# Patient Record
Sex: Male | Born: 1950
Health system: Southern US, Community
[De-identification: ages and names within clinical notes are randomized; demographics above are authoritative.]

## PROBLEM LIST (undated history)

## (undated) DIAGNOSIS — K5792 Diverticulitis of intestine, part unspecified, without perforation or abscess without bleeding: Secondary | ICD-10-CM

## (undated) DIAGNOSIS — M1990 Unspecified osteoarthritis, unspecified site: Secondary | ICD-10-CM

## (undated) DIAGNOSIS — J439 Emphysema, unspecified: Secondary | ICD-10-CM

## (undated) DIAGNOSIS — K649 Unspecified hemorrhoids: Secondary | ICD-10-CM

## (undated) DIAGNOSIS — K429 Umbilical hernia without obstruction or gangrene: Secondary | ICD-10-CM

## (undated) DIAGNOSIS — N390 Urinary tract infection, site not specified: Secondary | ICD-10-CM

## (undated) DIAGNOSIS — J45909 Unspecified asthma, uncomplicated: Secondary | ICD-10-CM

## (undated) DIAGNOSIS — H269 Unspecified cataract: Secondary | ICD-10-CM

## (undated) DIAGNOSIS — I2510 Atherosclerotic heart disease of native coronary artery without angina pectoris: Secondary | ICD-10-CM

## (undated) DIAGNOSIS — K069 Disorder of gingiva and edentulous alveolar ridge, unspecified: Secondary | ICD-10-CM

## (undated) DIAGNOSIS — T8859XA Other complications of anesthesia, initial encounter: Secondary | ICD-10-CM

## (undated) DIAGNOSIS — K635 Polyp of colon: Secondary | ICD-10-CM

## (undated) DIAGNOSIS — D6800 Von Willebrand disease, unspecified: Secondary | ICD-10-CM

## (undated) DIAGNOSIS — H409 Unspecified glaucoma: Secondary | ICD-10-CM

## (undated) DIAGNOSIS — Z9889 Other specified postprocedural states: Secondary | ICD-10-CM

## (undated) DIAGNOSIS — K4090 Unilateral inguinal hernia, without obstruction or gangrene, not specified as recurrent: Secondary | ICD-10-CM

## (undated) DIAGNOSIS — Z8739 Personal history of other diseases of the musculoskeletal system and connective tissue: Secondary | ICD-10-CM

## (undated) DIAGNOSIS — M4802 Spinal stenosis, cervical region: Secondary | ICD-10-CM

## (undated) DIAGNOSIS — T7840XA Allergy, unspecified, initial encounter: Secondary | ICD-10-CM

## (undated) DIAGNOSIS — H9190 Unspecified hearing loss, unspecified ear: Secondary | ICD-10-CM

## (undated) DIAGNOSIS — K219 Gastro-esophageal reflux disease without esophagitis: Secondary | ICD-10-CM

## (undated) DIAGNOSIS — R0600 Dyspnea, unspecified: Secondary | ICD-10-CM

## (undated) DIAGNOSIS — R112 Nausea with vomiting, unspecified: Secondary | ICD-10-CM

## (undated) DIAGNOSIS — N189 Chronic kidney disease, unspecified: Secondary | ICD-10-CM

## (undated) DIAGNOSIS — R55 Syncope and collapse: Secondary | ICD-10-CM

## (undated) DIAGNOSIS — C439 Malignant melanoma of skin, unspecified: Secondary | ICD-10-CM

## (undated) DIAGNOSIS — N289 Disorder of kidney and ureter, unspecified: Secondary | ICD-10-CM

## (undated) DIAGNOSIS — E785 Hyperlipidemia, unspecified: Secondary | ICD-10-CM

## (undated) DIAGNOSIS — J449 Chronic obstructive pulmonary disease, unspecified: Secondary | ICD-10-CM

## (undated) HISTORY — PX: MELANOMA EXCISION: SHX5266

## (undated) HISTORY — DX: Allergy, unspecified, initial encounter: T78.40XA

## (undated) HISTORY — DX: Polyp of colon: K63.5

## (undated) HISTORY — DX: Chronic kidney disease, unspecified: N18.9

## (undated) HISTORY — DX: Unspecified asthma, uncomplicated: J45.909

## (undated) HISTORY — DX: Syncope and collapse: R55

## (undated) HISTORY — DX: Unspecified cataract: H26.9

## (undated) HISTORY — PX: COLONOSCOPY: SHX174

## (undated) HISTORY — DX: Unspecified hemorrhoids: K64.9

## (undated) HISTORY — DX: Chronic obstructive pulmonary disease, unspecified: J44.9

## (undated) HISTORY — PX: OTHER SURGICAL HISTORY: SHX169

## (undated) HISTORY — DX: Diverticulitis of intestine, part unspecified, without perforation or abscess without bleeding: K57.92

## (undated) HISTORY — DX: Urinary tract infection, site not specified: N39.0

## (undated) HISTORY — DX: Disorder of gingiva and edentulous alveolar ridge, unspecified: K06.9

## (undated) HISTORY — DX: Disorder of kidney and ureter, unspecified: N28.9

## (undated) HISTORY — PX: DENTAL SURGERY: SHX609

## (undated) HISTORY — DX: Unilateral inguinal hernia, without obstruction or gangrene, not specified as recurrent: K40.90

## (undated) HISTORY — DX: Unspecified osteoarthritis, unspecified site: M19.90

## (undated) HISTORY — DX: Emphysema, unspecified: J43.9

## (undated) HISTORY — DX: Unspecified glaucoma: H40.9

## (undated) HISTORY — DX: Malignant melanoma of skin, unspecified: C43.9

## (undated) HISTORY — PX: WISDOM TOOTH EXTRACTION: SHX21

## (undated) HISTORY — DX: Personal history of other diseases of the musculoskeletal system and connective tissue: Z87.39

## (undated) HISTORY — DX: Umbilical hernia without obstruction or gangrene: K42.9

## (undated) HISTORY — DX: Gastro-esophageal reflux disease without esophagitis: K21.9

## (undated) HISTORY — PX: POLYPECTOMY: SHX149

## (undated) HISTORY — PX: HERNIA REPAIR: SHX51

## (undated) HISTORY — DX: Hyperlipidemia, unspecified: E78.5

---

## 1998-12-05 ENCOUNTER — Other Ambulatory Visit: Admission: RE | Admit: 1998-12-05 | Discharge: 1998-12-05 | Payer: Self-pay | Admitting: Urology

## 1998-12-29 ENCOUNTER — Other Ambulatory Visit: Admission: RE | Admit: 1998-12-29 | Discharge: 1998-12-29 | Payer: Self-pay | Admitting: Urology

## 1999-04-06 ENCOUNTER — Encounter (INDEPENDENT_AMBULATORY_CARE_PROVIDER_SITE_OTHER): Payer: Self-pay | Admitting: *Deleted

## 1999-04-06 ENCOUNTER — Ambulatory Visit (HOSPITAL_COMMUNITY): Admission: RE | Admit: 1999-04-06 | Discharge: 1999-04-06 | Payer: Self-pay | Admitting: *Deleted

## 2000-02-04 ENCOUNTER — Encounter: Payer: Self-pay | Admitting: *Deleted

## 2000-02-05 ENCOUNTER — Observation Stay (HOSPITAL_COMMUNITY): Admission: EM | Admit: 2000-02-05 | Discharge: 2000-02-05 | Payer: Self-pay | Admitting: *Deleted

## 2002-08-14 ENCOUNTER — Ambulatory Visit (HOSPITAL_COMMUNITY): Admission: RE | Admit: 2002-08-14 | Discharge: 2002-08-14 | Payer: Self-pay | Admitting: Orthopedic Surgery

## 2002-08-14 ENCOUNTER — Encounter: Payer: Self-pay | Admitting: Orthopedic Surgery

## 2005-03-19 ENCOUNTER — Encounter: Admission: RE | Admit: 2005-03-19 | Discharge: 2005-03-19 | Payer: Self-pay | Admitting: Family Medicine

## 2005-12-04 ENCOUNTER — Ambulatory Visit: Payer: Self-pay | Admitting: Hematology and Oncology

## 2005-12-11 LAB — CBC WITH DIFFERENTIAL/PLATELET
BASO%: 0.3 % (ref 0.0–2.0)
EOS%: 2.8 % (ref 0.0–7.0)
Eosinophils Absolute: 0.2 10*3/uL (ref 0.0–0.5)
MCHC: 35.6 g/dL (ref 32.0–35.9)
MCV: 87.5 fL (ref 81.6–98.0)
MONO%: 6.1 % (ref 0.0–13.0)
RBC: 4.89 10*6/uL (ref 4.20–5.71)
RDW: 13 % (ref 11.2–14.6)
lymph#: 2.5 10*3/uL (ref 0.9–3.3)

## 2005-12-13 LAB — COMPREHENSIVE METABOLIC PANEL
ALT: 26 U/L (ref 0–40)
AST: 16 U/L (ref 0–37)
Albumin: 4.4 g/dL (ref 3.5–5.2)
BUN: 19 mg/dL (ref 6–23)
Calcium: 9.7 mg/dL (ref 8.4–10.5)
Chloride: 107 mEq/L (ref 96–112)
Potassium: 3.8 mEq/L (ref 3.5–5.3)

## 2005-12-13 LAB — APTT: aPTT: 38 seconds — ABNORMAL HIGH (ref 24–37)

## 2005-12-26 LAB — OTHER SOLSTAS TEST

## 2005-12-28 LAB — VON WILLEBRAND PANEL: Factor-VIII Activity: 64 % — ABNORMAL LOW (ref 75–150)

## 2006-01-22 ENCOUNTER — Ambulatory Visit (HOSPITAL_COMMUNITY): Admission: RE | Admit: 2006-01-22 | Discharge: 2006-01-22 | Payer: Self-pay | Admitting: General Surgery

## 2007-02-25 ENCOUNTER — Encounter: Admission: RE | Admit: 2007-02-25 | Discharge: 2007-02-25 | Payer: Self-pay | Admitting: Family Medicine

## 2007-03-12 ENCOUNTER — Encounter: Admission: RE | Admit: 2007-03-12 | Discharge: 2007-03-12 | Payer: Self-pay | Admitting: Family Medicine

## 2007-03-24 ENCOUNTER — Encounter: Admission: RE | Admit: 2007-03-24 | Discharge: 2007-03-24 | Payer: Self-pay | Admitting: Family Medicine

## 2007-04-07 ENCOUNTER — Encounter: Admission: RE | Admit: 2007-04-07 | Discharge: 2007-04-07 | Payer: Self-pay | Admitting: Emergency Medicine

## 2007-06-04 ENCOUNTER — Encounter: Admission: RE | Admit: 2007-06-04 | Discharge: 2007-06-04 | Payer: Self-pay | Admitting: Otolaryngology

## 2007-06-11 ENCOUNTER — Other Ambulatory Visit: Admission: RE | Admit: 2007-06-11 | Discharge: 2007-06-11 | Payer: Self-pay | Admitting: Otolaryngology

## 2008-07-03 ENCOUNTER — Inpatient Hospital Stay (HOSPITAL_COMMUNITY): Admission: EM | Admit: 2008-07-03 | Discharge: 2008-07-04 | Payer: Self-pay | Admitting: Emergency Medicine

## 2008-07-03 ENCOUNTER — Ambulatory Visit: Payer: Self-pay | Admitting: Internal Medicine

## 2008-07-13 ENCOUNTER — Telehealth (INDEPENDENT_AMBULATORY_CARE_PROVIDER_SITE_OTHER): Payer: Self-pay

## 2008-07-14 ENCOUNTER — Ambulatory Visit: Payer: Self-pay

## 2008-07-14 ENCOUNTER — Encounter: Payer: Self-pay | Admitting: Cardiology

## 2008-07-28 DIAGNOSIS — R55 Syncope and collapse: Secondary | ICD-10-CM | POA: Insufficient documentation

## 2008-07-28 DIAGNOSIS — K219 Gastro-esophageal reflux disease without esophagitis: Secondary | ICD-10-CM | POA: Insufficient documentation

## 2008-07-28 DIAGNOSIS — M1990 Unspecified osteoarthritis, unspecified site: Secondary | ICD-10-CM | POA: Insufficient documentation

## 2008-07-28 DIAGNOSIS — E782 Mixed hyperlipidemia: Secondary | ICD-10-CM | POA: Insufficient documentation

## 2008-07-28 DIAGNOSIS — J42 Unspecified chronic bronchitis: Secondary | ICD-10-CM | POA: Insufficient documentation

## 2008-07-28 DIAGNOSIS — F329 Major depressive disorder, single episode, unspecified: Secondary | ICD-10-CM | POA: Insufficient documentation

## 2008-07-29 ENCOUNTER — Encounter: Payer: Self-pay | Admitting: Internal Medicine

## 2008-07-29 ENCOUNTER — Ambulatory Visit: Payer: Self-pay | Admitting: Internal Medicine

## 2009-01-27 ENCOUNTER — Encounter (INDEPENDENT_AMBULATORY_CARE_PROVIDER_SITE_OTHER): Payer: Self-pay | Admitting: *Deleted

## 2009-12-18 ENCOUNTER — Emergency Department (HOSPITAL_COMMUNITY): Admission: EM | Admit: 2009-12-18 | Discharge: 2009-12-19 | Payer: Self-pay | Admitting: Emergency Medicine

## 2010-04-27 ENCOUNTER — Telehealth (INDEPENDENT_AMBULATORY_CARE_PROVIDER_SITE_OTHER): Payer: Self-pay | Admitting: *Deleted

## 2010-05-18 NOTE — Progress Notes (Signed)
  MRS request received for records sent to [facility]  April 27, 2010 10:25 AM

## 2010-07-27 LAB — DIFFERENTIAL
Basophils Relative: 0 % (ref 0–1)
Eosinophils Relative: 1 % (ref 0–5)
Lymphocytes Relative: 13 % (ref 12–46)
Monocytes Relative: 6 % (ref 3–12)
Neutro Abs: 10.7 10*3/uL — ABNORMAL HIGH (ref 1.7–7.7)
Neutrophils Relative %: 79 % — ABNORMAL HIGH (ref 43–77)

## 2010-07-27 LAB — TSH: TSH: 1.521 u[IU]/mL (ref 0.350–4.500)

## 2010-07-27 LAB — T4, FREE: Free T4: 1.2 ng/dL (ref 0.89–1.80)

## 2010-07-27 LAB — CBC
HCT: 41.5 % (ref 39.0–52.0)
HCT: 42.1 % (ref 39.0–52.0)
Hemoglobin: 15.1 g/dL (ref 13.0–17.0)
MCHC: 35.3 g/dL (ref 30.0–36.0)
MCV: 88.1 fL (ref 78.0–100.0)
MCV: 89.6 fL (ref 78.0–100.0)
Platelets: 243 10*3/uL (ref 150–400)
Platelets: 254 10*3/uL (ref 150–400)
RBC: 4.63 MIL/uL (ref 4.22–5.81)
RDW: 12.9 % (ref 11.5–15.5)

## 2010-07-27 LAB — TROPONIN I: Troponin I: <0.01 ng/mL (ref 0.00–0.06)

## 2010-07-27 LAB — COMPREHENSIVE METABOLIC PANEL
ALT: 20 U/L (ref 0–53)
AST: 20 U/L (ref 0–37)
Albumin: 3.6 g/dL (ref 3.5–5.2)
BUN: 12 mg/dL (ref 6–23)
CO2: 27 mEq/L (ref 19–32)
Calcium: 8.8 mg/dL (ref 8.4–10.5)
Chloride: 108 mEq/L (ref 96–112)
Creatinine, Ser: 0.93 mg/dL (ref 0.4–1.5)
GFR calc non Af Amer: >60 mL/min (ref >60)
Potassium: 4.3 mEq/L (ref 3.5–5.1)
Sodium: 140 mEq/L (ref 135–145)
Total Protein: 6 g/dL (ref 6.0–8.3)

## 2010-07-27 LAB — URINALYSIS, ROUTINE W REFLEX MICROSCOPIC
Bilirubin Urine: NEGATIVE
Glucose, UA: NEGATIVE mg/dL
Hgb urine dipstick: NEGATIVE
Ketones, ur: NEGATIVE mg/dL
Nitrite: NEGATIVE
Protein, ur: 30 mg/dL — AB
Specific Gravity, Urine: 1.024 (ref 1.005–1.030)

## 2010-07-27 LAB — LIPID PANEL: Cholesterol: 203 mg/dL — ABNORMAL HIGH (ref 0–200)

## 2010-07-27 LAB — CARDIAC PANEL(CRET KIN+CKTOT+MB+TROPI)
Relative Index: 0.5 (ref 0.0–2.5)
Troponin I: <0.01 ng/mL (ref 0.00–0.06)

## 2010-07-27 LAB — POCT CARDIAC MARKERS: Myoglobin, poc: 73.9 ng/mL (ref 12–200)

## 2010-07-27 LAB — URINE MICROSCOPIC-ADD ON

## 2010-07-27 LAB — CK TOTAL AND CKMB (NOT AT ARMC)
CK, MB: 1.5 ng/mL (ref 0.3–4.0)
Relative Index: 0.6 (ref 0.0–2.5)

## 2010-07-27 LAB — POCT I-STAT, CHEM 8
Calcium, Ion: 1.18 mmol/L (ref 1.12–1.32)
Chloride: 105 mEq/L (ref 96–112)
Potassium: 4.5 mEq/L (ref 3.5–5.1)
TCO2: 26 mmol/L (ref 0–100)

## 2010-07-27 LAB — BRAIN NATRIURETIC PEPTIDE: Pro B Natriuretic peptide (BNP): 80 pg/mL (ref 0.0–100.0)

## 2010-08-29 NOTE — Discharge Summary (Signed)
Brandon Schultz, Brandon Schultz NO.:  [id number]   MEDICAL RECORD NO.:  [id number]          PATIENT TYPE:  INP   LOCATION:  4703                         FACILITY:  [facility]   PHYSICIAN:  Peggye Pitt, M.D. DATE OF BIRTH:  May 04, 1950   DATE OF ADMISSION:  07/02/2008  DATE OF DISCHARGE:  07/04/2008                               DISCHARGE SUMMARY   DISCHARGE DIAGNOSES:  1. Syncope, likely vasovagal in origin.  2. Hyperlipidemia.   DISCHARGE MEDICATIONS:  Aspirin 81 mg daily.   DISPOSITION AND FOLLOWUP:  Brandon Schultz is discharged to home today in a  stable condition.  He has been advised by [facility] to follow  up with them for an outpatient stress test.  The nurse practitioner,  Lorrewn has agreed to set this up.  Nonetheless, I will give him the  number for the office in case he does not hear from them for him to call  and make sure that his appointment has been arranged.   CONSULTATIONS THIS HOSPITALIZATION:  Dr. Ladona Ridgel from [facility].   IMAGES AND PROCEDURES THIS HOSPITALIZATION:  A chest x-ray on July 03, 2008, that showed no acute findings.   HISTORY AND PHYSICAL EXAMINATION:  For full details, please refer to the  dictation on July 03, 2008, by Dr. Janice Norrie, but in brief, Brandon Schultz is  a very pleasant 60 year old Caucasian man, who on day of admission was  at home eating crab legs when he started to feel dizzy and lightheaded  and felt things slowly getting dark.  Before this, he experienced  profuse diaphoresis and vomiting.  Proceeded to pass out for about 5-10  seconds according to his wife.  The patient had cold and clammy skin and  hence the wife called the EMS.  While EMS was en route, he had a second  syncopal episode that also lasted only a few seconds.  Apparently, he  was also found to be bradycardic and was externally paced while he was  being brought to the hospital.  However, I cannot see any documentation  of how low his heart  rate was, the lowest that I can see documented as  68.   HOSPITAL COURSE:  1. Syncope.  Given his bradycardia Cardiology was consulted.  They do      not believe that a pacemaker needs to be implanted.  He had a      completely normal coronary cath in 2001.  They have recommended      that he undergo an outpatient stress test, which his office will      set up with him.  2. Hyperlipidemia.  His fasting lipid panel is as follows, total      cholesterol 203, triglycerides 185, HDL 30 with an LDL of 136.      Given he only has one coronary artery disease risk factor, which      should be his family history, I do not believe we need to treat him      unless his LDL is greater than 160.   VITAL SIGNS ON DAY OF DISCHARGE:  Blood pressure  114/78, heart rate 77,  respirations 20, O2 sats 96% on room air with a temperature 98.2.   LABS ON DAY OF DISCHARGE:  Sodium 140, potassium 4.3, chloride 108,  bicarb 27, BUN 12, and creatinine 0.93 with the glucose of 90.  All of  his liver function tests were within normal limits.  WBCs 7.9,  hemoglobin 15.1, and platelet count of 243.  He has had 3 sets of  negative cardiac enzymes.      Peggye Pitt, M.D.  Electronically Signed     EH/MEDQ  D:  07/04/2008  T:  07/05/2008  Job:  [id number]   cc:   Doylene Canning. Ladona Ridgel, MD

## 2010-08-29 NOTE — H&P (Signed)
Brandon Schultz, ANGLEMYER NO.:  [id number]   MEDICAL RECORD NO.:  [id number]          PATIENT TYPE:  EMS   LOCATION:  MAJO                         FACILITY:  [facility]   PHYSICIAN:  Renee Ramus, MD       DATE OF BIRTH:  [birth year]   DATE OF ADMISSION:  07/02/2008  DATE OF DISCHARGE:                              HISTORY & PHYSICAL   The patient does not have a primary care physician.  He is unassigned.   HISTORY OF PRESENT ILLNESS:  The patient is a 60 year old male who  experienced acute syncopal episode while eating crab.  The patient  reports feeling fine most of the day at night beginning to feel funny  and reports everything was getting dark slowly.  He reports having a  very strenuous day of activity prior to this and eating very little.  The patient reports then feeling like passing out.  Per family he did  experience a syncopal episode lasting 5-10 seconds.  The patient was  then revived.  He had cool and clammy skin and reported being very  nauseous and wanting to throw up.  EMS was called.  The patient then  underwent a second episode.  He was found to be bradycardic.  They  attempted pacing.  He was brought to the emergency department.  The  patient has no history of these symptoms.  He has no history of previous  coronary artery disease.  The patient denies fevers, chills, night  sweats, diarrhea, constipation, chest pain, shortness of breath, dyspnea  exertion, PND, or orthopnea.  The patient is now on telemetry showing a  bradycardic rhythm.  He has no history of bradycardia.  The patient did  undergo cardiac cath in 2001 that showed clean coronary arteries.  The  patient does have a heavy tobacco history as well as alcohol use and has  a family history positive for early coronary artery disease.  The  patient being admitted for further evaluation and testing.  Cardiology  was consulted to address the question of risk stratification, i.e.,  whether the  patient deserves another cath and/or whether or not the  patient should be evaluated for pacemaker.   PAST MEDICAL HISTORY:  1. Chronic bronchitis.  2. Hyperlipidemia.  3. Osteoarthritis.   SOCIAL HISTORY:  The patient reports smoking approximately one to two  packs per day.  He had a very very stressful job.  He is married, lives  at home, and reports drinking one to two beers daily.   FAMILY HISTORY:  Not available.   REVIEW OF SYSTEMS:  All other comprehensive review of systems is  negative.   MEDICATIONS:  The patient has no known drug allergies and currently is  on no medications.   PHYSICAL EXAMINATION:  GENERAL:  This is a well-developed, well-  nourished white male currently in no apparent distress.  VITAL SIGNS:  Blood pressure 96/53, heart rate 79, respiratory rate 15,  and temperature 97.6.  HEENT:  No jugular venous distention or lymphadenopathy.  Oropharynx is  clear.  Mucous membranes pink and moist.  TMs are clear bilaterally.  Pupils equal and reactive to light and accommodation.  Extraocular  muscles are intact.  CARDIOVASCULAR:  He has a regular rate and rhythm, albeit brady, but no  obvious murmurs, rubs, or gallops.  PULMONARY:  Lungs are clear to auscultation with the exception of mild  decreased breath sounds in the bases and some mild rhonchi in the bases  bilaterally.  ABDOMEN:  Soft, nontender, and nondistended without hepatosplenomegaly.  Bowel sounds are present.  He has no rebound or guarding.  EXTREMITIES:  He has no clubbing, cyanosis, or edema.  He has good  peripheral pulses in dorsalis pedis and radial arteries.  He is able to  move all extremities.  NEUROLOGIC:  Cranial nerves II through XII are grossly intact. He is in  no acute distress.  He is conscious, alert, and oriented to person,  place, and time.   STUDIES:  1. Chest x-ray shows low lung volumes, but no evidence of focal      disease; however, he does have exaggerated pulmonary  vasculature      consistent with chronic bronchitis.  2. Cardiac cath in all one shows normal angiography with no evidence      of significant disease.  3. EKG shows sinus bradycardia with no evidence of heart block.  He      has no evidence of acute coronary syndrome.   LABORATORY DATA:  White count 13.5, H and H 14 and 43, MCV 89, plates  952.  Sodium 139, potassium 4.5, chloride 105, bicarb 26, BUN 16,  creatinine 1.1, glucose 109.  Troponin I is negative x2.  UA shows  specific gravity elevated at 1.024.   ASSESSMENT AND PLAN:  1. Syncope, although the patient has classic symptoms of      neurocardiogenic syncope, his persistent bradycardia is worrisome      for acute inferior wall myocardial infarction.  We will discuss      with Cardiology regarding possible cardiac catheterization versus      evaluation for potential pacemaker placement.  We will check serial      enzymes, repeat EKG, place the patient on aspirin.  We will not      place the patient on metoprolol given his bradycardia and      hypotension.  We will also check lipid panel and thyroid.  2. Tobacco use.  We will place the patient on nicotine patch.  3. Hyperlipidemia.  We will check lipid panel.  4. Hypotension.  We will give the patient IV fluids.  I believe that      his alcohol consumption could have led to his dehydration.  5. Osteoarthritis, currently stable.  6. Alcohol use.  We will monitor for signs of withdrawal, but I      believe intervention at this time is not warranted.   DISPOSITION:  The patient is full code.  H and P was constructed by  reviewing past medical history, conferring with emergency medical room  physician, and reviewing the emergency medical record.   TIME SPENT:  1 hour.      Renee Ramus, MD  Electronically Signed     JF/MEDQ  D:  07/03/2008  T:  07/04/2008  Job:  [id number]

## 2010-08-29 NOTE — Consult Note (Signed)
[name], [name] NO.:  [id number]   MEDICAL RECORD NO.:  [id number]          PATIENT TYPE:  INP   LOCATION:  4703                         FACILITY:  [facility]   PHYSICIAN:  Hillis Range, MD       DATE OF BIRTH:  11/04/1950   DATE OF CONSULTATION:  07/03/2008  DATE OF DISCHARGE:                                 CONSULTATION   PRIMARY CARDIOLOGIST:  Luis Abed, MD, [facility]   PRIMARY CARE PHYSICIAN:  The patient does not have one.   CONSULTING PHYSICIAN:  [facility] physician, Dr. Janice Norrie.   REASON FOR CONSULTATION:  Chest pain with syncope.   HISTORY OF PRESENT ILLNESS:  A 60 year old Caucasian male who while  eating crab legs last night began to feel nauseated and diaphoretic.  The patient began also to have some chest discomfort and vomited and  then had a syncopal episode.  A friend who was with him called 911.  When they arrived, the patient vomited again and also passed out.  EKG  placed AED on the patient secondary to bradycardia, and the patient  states that he was feeling some shocks.  It was seemed AED.  The patient  was trying to be paced externally prior to arrival secondary to the  bradycardia.  The patient on arrival to the emergency room was very  hypotensive with a blood pressure of 96/53.  The patient remembers  having some pain in his chest and also feeling like someone sitting on  his chest on arrival to the emergency room.  The patient was given IV  fluids, GI cocktail, and some medication for pain and he began to feel  better and did spend the night in CDU prior to [place] see him this morning.  The patient has some chronic left shoulder pain with movement along with  neck pain chronically secondary to degenerative disk disease.  The  patient has been seen by Cardiology approximately 9 years ago where he  had a normal catheterization in 2001 and has not followed with  Cardiology since that time.  The patient does not have a primary care  physician presently.   REVIEW OF SYSTEMS:  Positive for diaphoresis, chest pain, syncope,  nausea, and vomiting with all other systems reviewed and found to be  negative.   PAST MEDICAL HISTORY:  1. Depression.  2. GERD.  3. Degenerative joint disease.  4. Bronchitis.  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  6. Hypercholesterolemia.   PAST SURGICAL HISTORY:  Left knee ACL repair and left knee ligament  repair ligament and bone repair with some screws.  The patient also has  a history of inguinal hernia and tonsillectomy repair.   SOCIAL HISTORY:  She lives in [place], [place] with his wife.  He  works in Production designer, theatre/television/film at a [employer].  He is married with 4 children.  He is a 40-pack-year smoker.  Beer daily 2-3 each day.  He denies any  drug use.   FAMILY HISTORY:  Mother with colon cancer.  Father deceased in his 85s  with an MI.  He has 2 sisters  who he believes are in good health at  present.   CURRENT MEDICATIONS:  None known.   ALLERGIES:  None.   CURRENT LABORATORY DATA:  Sodium 139, potassium 4.5, chloride 105, CO2  of 26, BUN 15, creatinine 1.1, and glucose 109.  Hemoglobin 14.6,  hematocrit 41.5, white blood cell are elevated at 13.5, and platelets  254.  Troponin 0.05 and less than 0.01.  Chest x-ray revealing no acute  finding.  EKG revealing normal sinus rhythm without evidence of heart  block or ischemia.   PHYSICAL EXAMINATION:  VITAL SIGNS:  Blood pressure 113/76, pulse 84,  respirations 18, temperature 97.0, and O2 saturation 96% on room air.  GENERAL:  He is awake, alert, and oriented, no acute distress.  HEENT:  Head is normocephalic and atraumatic.  The patient wears  glasses.  Mucous membranes are pink and moist.  Tongue is midline.  He  has missing teeth.  NECK:  Supple.  There is no bruit.  No JVD.  CARDIOVASCULAR:  Regular rate and rhythm without murmurs, rubs, or  gallops.  Pulses are 2+ and equal without bruits.  LUNGS:  Have crackles  and wheezes  noted, especially in right lower lobe.  ABDOMEN:  Soft and nontender with 2+ bowel sounds.  No rebound or  guarding is noted.  EXTREMITIES:  Without clubbing, cyanosis, or edema.  NEURO:  Cranial nerves II through XII are grossly intact.   IMPRESSION:  1. Hypotension.  2. Chest pain.  3. Syncopal episode with associated nausea and vomiting.   PLAN:  This is a 60 year old Caucasian male with multiple cardiovascular  risk factors to include tobacco, family history of hypercholesterolemia,  and EtOH who was admitted status post syncopal episode following nausea  and vomiting after eating crab legs associated with some chest pain and  hypotension.  The patient has now been placed on aspirin and pain  medications.  We would recommend echo for LV function, continue to cycle  cardiac enzymes, and consider stress Myoview as an outpatient.  We will  also check lipids and LFTs for risk stratification in the a.m. and add a  PPI.  This appears to be a probable vagal response.  We will follow and  make further recommendations based upon test results and the patient's  response to treatment.      Bettey Mare. Lyman Bishop, NP      Hillis Range, MD  Electronically Signed    KML/MEDQ  D:  07/03/2008  T:  07/04/2008  Job:  [id number]

## 2010-09-01 NOTE — Op Note (Signed)
Brandon Schultz, Brandon Schultz NO.:  [id number]   MEDICAL RECORD NO.:  [id number]          PATIENT TYPE:  AMB   LOCATION:  DAY                          FACILITY:  [facility]   PHYSICIAN:  Adolph Pollack, M.D.DATE OF BIRTH:  22-Jul-1950   DATE OF PROCEDURE:  01/22/2006  DATE OF DISCHARGE:                                 OPERATIVE REPORT   PREOPERATIVE DIAGNOSIS:  Epigastric and umbilical herniae.   POSTOPERATIVE DIAGNOSIS:  Epigastric and umbilical herniae.   PROCEDURE:  Repair of epigastric and umbilical herniae with mesh.   SURGEON:  Adolph Pollack, M.D.   ANESTHESIA:  General.   INDICATIONS:  60 year old male who noticed a bulge in the epigastric region  superior to the umbilicus.  A CT scan demonstrated a small epigastric  ventral hernia.  He has a small inguinal hernia that is asymptomatic and has  a small umbilical hernia.  Since I saw him in the office, he has been having  a little nausea and more discomfort from the umbilical hernia.  The plan is  to repair the epigastric and umbilical herniae with mesh through a single  incision.   TECHNIQUE:  He is seen in the holding area and the herniae are marked.  He  is then brought to the operating room and placed supine on the operating  table and general anesthetic was administered.  Hair from the abdominal wall  was clipped and the area sterilely prepped and draped.  An incision was made  just above the umbilicus where the epigastric hernia was and carried down  and made just to the right of the umbilicus.  The subcutaneous tissue was  divided with the cautery until the fascia was identified.  I identified the  epigastric hernia which had some omentum protruding from it and I was able  to reduce this.  I then freed up fascia around this hernia defect.  I then  of released the umbilicus from the fascia and identified the underlying  umbilical hernia and dissected some subcutaneous tissue around that area.  I  dissected the subcutaneous tissue around both areas and the areas inbetween  to allow for about a 3-4 cm overlap.   Next, I primarily repaired both herniae with interrupted 0 Surgilon suture.  I then brought a piece of 3 x 6 inch polypropylene mesh into the field and  cut it to appropriate size.  I threaded the primary repair incisions over  both herniae through the mesh and tied them down anchoring the mesh directly  over the primary repairs.  The periphery of the mesh was then anchored to  the surrounding fascia for an overlap of 3-4 cm with a running 0 Prolene  suture.  Excess mesh was trimmed.  This provided for more than adequate  coverage of the defect with adequate overlap.   At this point, hemostasis was adequate.  Of note was that the patient was  given BDAVP pre-incision for a mild case of von Willebrand's disease.  No  excessive bleeding was noted during the surgery.  I reattached the umbilicus  to the mesh with a  2-0 Vicryl suture.  I then closed the subcutaneous tissue  in two layers with a running 2-0 Vicryl suture.  I injected Marcaine in the fascial area and subcutaneous tissue.  The skin  was then closed with 3-0 Monocryl subcuticular stitch.  Steri-Strips and a  sterile dressing were applied.  He tolerated the procedure well without any  apparent complications and was taken to the recovery room in satisfactory  condition.      Adolph Pollack, M.D.  Electronically Signed     TJR/MEDQ  D:  01/22/2006  T:  01/23/2006  Job:  [id number]   cc:   Mosetta Putt, M.D.  Fax: [phone]

## 2010-09-01 NOTE — Procedures (Signed)
[place]  Patient:    Brandon Schultz                     MRN: [id number] Proc. Date: 04/06/99 Adm. Date:  [id number] Attending:  Mingo Amber                           Procedure Report  PROCEDURE:  Video colonoscopy.  INDICATIONS:  A family history of colon cancer in mother and polyps in sister.  PREPARATION:  He is n.p.o. since midnight having taking Phospho-Soda prep and a  clear liquid diet.  The mucosa throughout is clean.  Depth of insertion, cecum.  PREPROCEDURE SEDATION:  He received a total of 75 mg of Demerol and 8 mg of Versed intravenously.  In addition, he was on 2 L of nasal cannula O2.  DESCRIPTION OF PROCEDURE:  The Olympus video colonoscope was inserted via the rectum and advanced very easily all the way to the cecum.  Cecal landmarks were  identified and photographed.  On withdrawal, the mucosa was carefully evaluated.  A diminutive polyp was noted in the ascending colon and removed with hot biopsy forceps.  No other pathology was noted from the splenic flexure to the retroflexed view of the rectum.  There were no other polyps, areas of inflammation, or diverticulosis.  There were no significant internal hemorrhoids.  The patient tolerated the procedure well.  Pulse, blood pressure, and oximetry testing were  stable throughout.  There were no noted complications.  He was observed in recovery for 45 minutes and discharged home, alert with a benign abdomen.  IMPRESSION:  Diminutive ascending colon polyp, otherwise normal colonoscopy.  RECOMMENDATIONS:  The patient will receive a letter advising him of the polyp histology.  If it proves to be an adenoma, consideration should be given to a repeat colonoscopy in three years.  If it is hyperplastic, in light of his family history, he still should consider repeat colonoscopy in 5-10 years. DD:  04/06/99 TD:  04/07/99 Job: [id number]

## 2010-09-01 NOTE — Cardiovascular Report (Signed)
[place]  Patient:    Brandon Schultz, Brandon Schultz                    MRN: [id number] Proc. Date: 02/05/00 Adm. Date:  [id number] Attending:  Talitha Givens CC:         Carolyne Fiscal, M.D.  Cath lab   Cardiac Catheterization  INDICATIONS:  Mr. Stuard is 60 years old and has a positive family history of coronary artery disease and is a smoker, and presented to the hospital tonight with chest tightness, diaphoresis, and nausea.  He was admitted by Dr. Myrtis Ser.  DESCRIPTION OF PROCEDURE:  The procedure was performed via the right femoral artery using arterial sheath and 6 French preformed coronary catheters.  A front wall arterial puncture was performed and Omnipaque contrast was used. The right femoral artery was closed with Perclose at the end of the procedure. The patient tolerated the procedure well and left the laboratory in satisfactory condition.  RESULTS:  The left main coronary artery was free of significant disease.  The left anterior descending artery gave rise to two diagonal branches and two septal perforators.  These were free of significant disease.  The circumflex artery gave rise to an atrial branch, a marginal branch, and AV branch.  These vessels were free of significant disease.  The right coronary artery was a moderate size vessel and gave rise to a right ventricular branch, a posterior descending branch, and two posterolateral branches.  These vessels were free of significant disease.  The left ventriculogram performed in the RAO projection showed good wall motion with no areas of hypokinesis.  The estimated ejection fraction was 55%.  The aortic pressure was 105/65, mean of 81 and left ventricular pressure was 105/16.  CONCLUSION:  Normal coronary angiography and left ventricular wall motion.  RECOMMENDATIONS:  Reassurance.  The patient does have previous history of depressive symptoms and does not have a lot of  symptoms suggestive of reflux. Will not plan any further evaluation at this time other than reassurance and arrange for a follow-up with Dr. Duaine Dredge at a later time. DD:  02/05/00 TD:  02/05/00 Job: [id number]

## 2010-09-01 NOTE — Discharge Summary (Signed)
[place]  Patient:    Brandon Schultz, Brandon Schultz                    MRN: [id number] Adm. Date:  [id number] Disc. Date: [id number] Attending:  Talitha Givens Dictator:   Tereso Newcomer, P.A. CC:         Carolyne Fiscal, M.D. (Phone [phone])   Discharge Summary  DATE OF BIRTH:  April 10, 1951  ADMISSION DIAGNOSIS:  Chest pain.  PROCEDURES:  Cardiac catheterization by Dr. Charlies Constable on February 05, 2000, revealing normal coronaries, normal left ventricle, EF of about 55%.  DISCHARGE DIAGNOSES: 1. Depression. 2. Questionable gastroesophageal reflux disease. 3. Positive family history for coronary artery disease. 4. Positive tobacco use. 5. Unknown lipid status.  ADMISSION HISTORY:  This 60 year old male was seen in the emergency room on February 04, 2000, complaining of chest pain.  This was associated with marked diaphoresis and nausea.  As noted above, he has a positive family history of coronary disease and is a smoker.  PHYSICAL EXAMINATION:  VITAL SIGNS:  Blood pressure 100/60, pulse 67, respirations 20.  LUNGS:  Clear.  NECK:  Within normal limits.  CARDIAC:  S1, S2, no murmurs.  ABDOMEN:  Soft.  EXTREMITIES:  No edema.  INITIAL LABORATORY DATA:  White count 15,300, hemoglobin 16, hematocrit 47, platelet count 323,000.  INR 1.2.  Sodium 141, potassium 3.9, chloride 103, BUN 14, creatinine 1.3, glucose 101.  Cardiac Enzymes: Total CK #1 was 145, #2 was 111, #3 was 119.  CK-MB #1 was 0.8, #2 was 0.8, #3 was 0.5.  Troponin I was 0.01 x 3.  Chest x-ray:  Mild hyperinflation, no active disease.  HOSPITAL COURSE:  The patient was admitted for chest pain, ruled out for MI by enzymes as noted above.  He went for cardiac catheterization on February 05, 2000, by Dr. Charlies Constable with results as noted above.  He had no immediate complications.  He returned to his room.  At the time of this dictation, it was decided that if the patient  completed his rest period and ambulated well without difficulty, he would be cleared for discharge to home.  DISCHARGE MEDICATIONS: 1. Prozac 20 mg q.d. 2. Zantac 150 mg p.o. b.i.d.  ACTIVITY:  No driving, heavy lifting, exertion, or sex for three days.  DIET: Low fat, low cholesterol, low sodium.  WOUND CARE:  The patient was to watch his groin for increased swelling, bleeding, or bruising and call office with concerns.  FOLLOWUP:  With Dr. Duaine Dredge in one week for post hospital followup and groin check.  He should have lipid profile checked with his primary care physician. DD:  02/05/00 TD:  02/05/00 Job: [id number]

## 2012-07-08 ENCOUNTER — Encounter (INDEPENDENT_AMBULATORY_CARE_PROVIDER_SITE_OTHER): Payer: Self-pay

## 2012-07-08 ENCOUNTER — Encounter (INDEPENDENT_AMBULATORY_CARE_PROVIDER_SITE_OTHER): Payer: Self-pay | Admitting: Surgery

## 2012-07-08 ENCOUNTER — Ambulatory Visit (INDEPENDENT_AMBULATORY_CARE_PROVIDER_SITE_OTHER): Payer: BC Managed Care – PPO | Admitting: Surgery

## 2012-07-08 VITALS — BP 130/84 | HR 87 | Temp 97.6°F | Resp 16 | Ht 72.0 in | Wt 224.0 lb

## 2012-07-08 DIAGNOSIS — K649 Unspecified hemorrhoids: Secondary | ICD-10-CM | POA: Insufficient documentation

## 2012-07-08 NOTE — Progress Notes (Signed)
Patient ID: Brandon Schultz, male   DOB: 05-28-1950, 62 y.o.   MRN: [id number]  Chief Complaint  Patient presents with  . New Evaluation    eval hems  . Rectal Problems    HPI Brandon Schultz is a 62 y.o. male.  Patient said request of Dr. Bosie Clos of gastroenterology for internal hemorrhoids. He is a heavy bleeding and itching for a number of months. He has tried topical medication which has helped some. He does have some leakage hygiene issues. No history of constipation. HPI  Past Medical History  Diagnosis Date  . H/O degenerative disc disease   . Gum disease   . Colon polyps   . Asthma     Past Surgical History  Procedure Laterality Date  . Hernia repair    . Left knee surgery    . Neck and shoulder injection      Family History  Problem Relation Age of Onset  . Colon cancer Mother   . Cancer Mother     colon    Social History History  Substance Use Topics  . Smoking status: Former Games developer  . Smokeless tobacco: Not on file  . Alcohol Use: No    No Known Allergies  Current Outpatient Prescriptions  Medication Sig Dispense Refill  . doxycycline (PERIOSTAT) 20 MG tablet Take 20 mg by mouth 2 (two) times daily.       No current facility-administered medications for this visit.    Review of Systems Review of Systems  Constitutional: Negative for fever, chills and unexpected weight change.  HENT: Negative for hearing loss, congestion, sore throat, trouble swallowing and voice change.   Eyes: Negative for visual disturbance.  Respiratory: Negative for cough and wheezing.   Cardiovascular: Negative for chest pain, palpitations and leg swelling.  Gastrointestinal: Positive for anal bleeding. Negative for nausea, vomiting, abdominal pain, diarrhea, constipation, blood in stool, abdominal distention and rectal pain.  Genitourinary: Negative for hematuria and difficulty urinating.  Musculoskeletal: Negative for arthralgias.  Skin: Negative for rash and wound.    Neurological: Negative for seizures, syncope, weakness and headaches.  Hematological: Negative for adenopathy. Does not bruise/bleed easily.  Psychiatric/Behavioral: Negative for confusion.    Blood pressure 130/84, pulse 87, temperature 97.6 F (36.4 C), temperature source Temporal, resp. rate 16, height 6' (1.829 m), weight 224 lb (101.606 kg), SpO2 98.00%.  Physical Exam Physical Exam  Constitutional: He appears well-developed and well-nourished.  HENT:  Head: Normocephalic and atraumatic.  Eyes: Conjunctivae and EOM are normal. Pupils are equal, round, and reactive to light.  Neck: Normal range of motion. Neck supple.  Genitourinary:     Skin: Skin is warm and dry.  Psychiatric: He has a normal mood and affect. His behavior is normal. Judgment and thought content normal.     Assessment    Grade 2 - 3 internal hemorrhoids 3 columns    Plan    Discussed options of treatment for hemorrhoid disease. He has 3 column disease. I discussed banding versus sclerotherapy versus medical management and surgical treatment. I think he would do well with banding or sclerotherapy. Discussed the pros and cons of each. He liked the idea sclerotherapy. Risk of bleeding, infection, need for further operations discussed. Postprocedure outcomes discussed. Likelihood of success discussed the 50%. He agreed to proceed. Anoscope was placed. I inject 1.5 cc of sclerosant into the right posterior column and left lateral column. Patient tolerated procedure well. He will return in 6 weeks for recheck. Recommend high fiber diet topical  ointment as needed. If no better he may need hemorrhoidectomy.       Sharon Stapel A. 07/08/2012, 10:55 AM

## 2012-07-08 NOTE — Patient Instructions (Addendum)
Hemorrhoids Hemorrhoids are enlarged (dilated) veins around the rectum. There are 2 types of hemorrhoids, and the type of hemorrhoid is determined by its location. Internal hemorrhoids occur in the veins just inside the rectum.They are usually not painful, but they may bleed.However, they may poke through to the outside and become irritated and painful. External hemorrhoids involve the veins outside the anus and can be felt as a painful swelling or hard lump near the anus.They are often itchy and may crack and bleed. Sometimes clots will form in the veins. This makes them swollen and painful. These are called thrombosed hemorrhoids. CAUSES Causes of hemorrhoids include:  Pregnancy. This increases the pressure in the hemorrhoidal veins.  Constipation.  Straining to have a bowel movement.  Obesity.  Heavy lifting or other activity that caused you to strain. TREATMENT Most of the time hemorrhoids improve in 1 to 2 weeks. However, if symptoms do not seem to be getting better or if you have a lot of rectal bleeding, your caregiver may perform a procedure to help make the hemorrhoids get smaller or remove them completely.Possible treatments include:  Rubber band ligation. A rubber band is placed at the base of the hemorrhoid to cut off the circulation.  Sclerotherapy. A chemical is injected to shrink the hemorrhoid.  Infrared light therapy. Tools are used to burn the hemorrhoid.  Hemorrhoidectomy. This is surgical removal of the hemorrhoid. HOME CARE INSTRUCTIONS   Increase fiber in your diet. Ask your caregiver about using fiber supplements.  Drink enough water and fluids to keep your urine clear or pale yellow.  Exercise regularly.  Go to the bathroom when you have the urge to have a bowel movement. Do not wait.  Avoid straining to have bowel movements.  Keep the anal area dry and clean.  Only take over-the-counter or prescription medicines for pain, discomfort, or fever as  directed by your caregiver. If your hemorrhoids are thrombosed:  Take warm sitz baths for 20 to 30 minutes, 3 to 4 times per day.  If the hemorrhoids are very tender and swollen, place ice packs on the area as tolerated. Using ice packs between sitz baths may be helpful. Fill a plastic bag with ice. Place a towel between the bag of ice and your skin.  Medicated creams and suppositories may be used or applied as directed.  Do not use a donut-shaped pillow or sit on the toilet for long periods. This increases blood pooling and pain. SEEK MEDICAL CARE IF:   You have increasing pain and swelling that is not controlled with your medicine.  You have uncontrolled bleeding.  You have difficulty or you are unable to have a bowel movement.  You have pain or inflammation outside the area of the hemorrhoids.  You have chills or an oral temperature above 102 F (38.9 C). MAKE SURE YOU:   Understand these instructions.  Will watch your condition.  Will get help right away if you are not doing well or get worse. Document Released: 03/30/2000 Document Revised: 06/25/2011 Document Reviewed: 03/13/2010 [place] Patient Information 2013 [place], [place].    EXPECT SOME MILD BLEEDING AFTER INJECTION AND DULL ACHE FOR 1 WEEK,  CALL IF HIGH FEVER OR SEVERE RECTAL PAIN

## 2012-08-18 ENCOUNTER — Ambulatory Visit (INDEPENDENT_AMBULATORY_CARE_PROVIDER_SITE_OTHER): Payer: BC Managed Care – PPO | Admitting: Surgery

## 2012-08-18 ENCOUNTER — Encounter (INDEPENDENT_AMBULATORY_CARE_PROVIDER_SITE_OTHER): Payer: Self-pay | Admitting: Surgery

## 2012-08-18 VITALS — BP 136/86 | HR 85 | Temp 98.4°F | Ht 71.0 in | Wt 221.8 lb

## 2012-08-18 DIAGNOSIS — K649 Unspecified hemorrhoids: Secondary | ICD-10-CM

## 2012-08-18 NOTE — Patient Instructions (Signed)
Return as needed

## 2012-08-18 NOTE — Progress Notes (Signed)
Patient returns in followup for his hemorrhoid disease. He underwent sclerotherapy and has noticed less bleeding. His symptoms improved by about 50%. He is comfortable.  Exam: Anal canal shows residual external hemorrhoid disease and minimal prolapse of his internal disease involving 3 columns. Digital exam is normal.  Impression: Grade 2 to grade 3 internal hemorrhoids status post sclerotherapy improvement  Plan: Discussed surgical treatment favors medical management. He is content with his progress and we'll continue medical management and return as needed.

## 2013-02-18 ENCOUNTER — Encounter: Payer: Self-pay | Admitting: Physician Assistant

## 2013-02-18 ENCOUNTER — Ambulatory Visit (INDEPENDENT_AMBULATORY_CARE_PROVIDER_SITE_OTHER): Payer: Medicare Other | Admitting: Physician Assistant

## 2013-02-18 ENCOUNTER — Ambulatory Visit (HOSPITAL_BASED_OUTPATIENT_CLINIC_OR_DEPARTMENT_OTHER)
Admission: RE | Admit: 2013-02-18 | Discharge: 2013-02-18 | Disposition: A | Discharge status: Home / Self Care | Payer: Medicare Other | Source: Ambulatory Visit | Attending: Physician Assistant | Admitting: Physician Assistant

## 2013-02-18 VITALS — BP 122/84 | HR 68 | Temp 97.9°F | Resp 20 | Ht 70.5 in | Wt 221.0 lb

## 2013-02-18 DIAGNOSIS — E785 Hyperlipidemia, unspecified: Secondary | ICD-10-CM

## 2013-02-18 DIAGNOSIS — R109 Unspecified abdominal pain: Secondary | ICD-10-CM

## 2013-02-18 DIAGNOSIS — J449 Chronic obstructive pulmonary disease, unspecified: Secondary | ICD-10-CM

## 2013-02-18 DIAGNOSIS — J42 Unspecified chronic bronchitis: Secondary | ICD-10-CM

## 2013-02-18 DIAGNOSIS — Z7189 Other specified counseling: Secondary | ICD-10-CM

## 2013-02-18 DIAGNOSIS — Z23 Encounter for immunization: Secondary | ICD-10-CM

## 2013-02-18 DIAGNOSIS — Z125 Encounter for screening for malignant neoplasm of prostate: Secondary | ICD-10-CM

## 2013-02-18 DIAGNOSIS — Z7689 Persons encountering health services in other specified circumstances: Secondary | ICD-10-CM

## 2013-02-18 DIAGNOSIS — K219 Gastro-esophageal reflux disease without esophagitis: Secondary | ICD-10-CM

## 2013-02-18 LAB — COMPREHENSIVE METABOLIC PANEL
ALT: 13 U/L (ref 0–53)
AST: 14 U/L (ref 0–37)
Albumin: 4.2 g/dL (ref 3.5–5.2)
CO2: 28 mEq/L (ref 19–32)
Calcium: 9.5 mg/dL (ref 8.4–10.5)
Chloride: 105 mEq/L (ref 96–112)
Creat: 0.92 mg/dL (ref 0.50–1.35)
Potassium: 4.3 mEq/L (ref 3.5–5.3)
Sodium: 139 mEq/L (ref 135–145)
Total Protein: 6.6 g/dL (ref 6.0–8.3)

## 2013-02-18 LAB — CBC WITH DIFFERENTIAL/PLATELET
Eosinophils Absolute: 0.2 10*3/uL (ref 0.0–0.7)
Hemoglobin: 15 g/dL (ref 13.0–17.0)
Lymphocytes Relative: 35 % (ref 12–46)
Lymphs Abs: 3.2 10*3/uL (ref 0.7–4.0)
MCH: 29.9 pg (ref 26.0–34.0)
MCV: 85.6 fL (ref 78.0–100.0)
Monocytes Relative: 10 % (ref 3–12)
Neutrophils Relative %: 53 % (ref 43–77)
RBC: 5.01 MIL/uL (ref 4.22–5.81)

## 2013-02-18 MED ORDER — ALBUTEROL SULFATE HFA 108 (90 BASE) MCG/ACT IN AERS: 2.0000 | INHALATION_SPRAY | Freq: Four times a day (QID) | RESPIRATORY_TRACT | Status: DC | PRN

## 2013-02-18 NOTE — Patient Instructions (Signed)
Please obtain labs.  I will call you with your results.  Then proceed downstairs to Imaging for X-ray.  I will call you with your results.  I have sent in a prescription to your pharmacy for the albuterol rescue inhaler.  Take a daily probiotic and daily fiber supplement.  Begin taking samples of Nexium for acid reflux.  For the acid reflux symptoms, I want to see you in 1 month.  If your labs or imaging are abnormal we will treat you accordingly and schedule follow-up as needed.  Also, please return to lab at your earliest convenience (fasting) so that we can obtain your cholesterol levels.

## 2013-02-18 NOTE — Progress Notes (Signed)
Patient ID: Brandon Schultz, male   DOB: [birth year], 62 y.o.   MRN: [id number]  Patient presents to clinic today to establish care.  Acute Concerns: Patient endorses pain in lower abdomen, radiating to inguinal region that has been present over the past few days.  Pain is intermittent and brought on by motion.  Patient states pain is throbbing in nature.  Denies trauma but does endorse overuse of abdominal muscles.  Has history of hernia but denies current mass or bulge.  Has not taken anything for symptoms because he states it is so infrequent.  Chronic Issues: (1) Melanoma -- followed by Dermatology ([place], [place]) (2) Acid Reflux -- Formerly occasional symptoms, only when eating late at night.  Controlled in the past with TUMS.  Patient now with more frequent symptoms, including halitosis.  (3) COPD -- currently on no therapy.  Former smoker. Patient will need a rescue inhaler.  Will obtain records from previous PCP and obtain PFTs if needed. (4) Hx of umbilical hernias -- s/p surgical repair.  Has occasional pain if he presses deep into area of previous hernia. (5) Hyperlipidemia -- reported history.  Patient overdue for fasting lipid profile.  Health Maintenance: Dental -- UTD Vision -- UTD Colonoscopy -- 2013 -- benign polyps; repeat due in 2018 Immunizations --  UTD; reports having flu shot this year.  Will be getting pneumonia vaccination.   Past Medical History  Diagnosis Date  . H/O degenerative disc disease   . Gum disease   . Colon polyps   . Asthma   . Umbilical hernia   . COPD (chronic obstructive pulmonary disease)   . Inguinal hernia   . Emphysema   . Diverticulitis   . Renal disease     Bright's Disease-childhood  . Syncope   . Melanoma     facial  . Hemorrhoids   . Arthritis   . Colon polyps   . GERD (gastroesophageal reflux disease)     No current outpatient prescriptions on file prior to visit.   No current facility-administered medications on file  prior to visit.    No Known Allergies  Family History  Problem Relation Age of Onset  . Colon cancer Mother   . Heart attack Father     Deceased-Early 17s  . Cancer Maternal Grandfather   . Dementia Maternal Grandmother   . Syncope episode Sister   . Healthy Sister     x1  . Healthy Daughter     x2  . Healthy Son     x2    History   Social History  . Marital Status: Married    Spouse Name: N/A    Number of Children: N/A  . Years of Education: N/A   Social History Main Topics  . Smoking status: Former Games developer  . Smokeless tobacco: Never Used  . Alcohol Use: Yes     Comment: rare -- beer  . Drug Use: No  . Sexual Activity: None   Other Topics Concern  . None   Social History Narrative  . None   Review of Systems  Constitutional: Negative for fever, weight loss and malaise/fatigue.  HENT: Negative for ear pain, hearing loss and tinnitus.   Eyes: Negative for blurred vision, double vision, photophobia and pain.  Respiratory: Positive for cough. Negative for shortness of breath and wheezing.   Cardiovascular: Negative for chest pain and palpitations.  Gastrointestinal: Positive for heartburn, abdominal pain and diarrhea. Negative for nausea, vomiting, constipation, blood in stool and melena.  Genitourinary: Negative for dysuria, urgency, frequency, hematuria and flank pain.  Musculoskeletal: Positive for neck pain.  Neurological: Negative for dizziness, seizures, loss of consciousness and headaches.  Endo/Heme/Allergies: Negative for environmental allergies.  Psychiatric/Behavioral: Negative for depression, suicidal ideas, hallucinations and substance abuse. The patient is not nervous/anxious and does not have insomnia.    Filed Vitals:   02/18/13 1051  BP: 122/84  Pulse: 68  Temp: 97.9 F (36.6 C)  Resp: 20   Physical Exam  Vitals reviewed. Constitutional: He is oriented to person, place, and time and well-developed, well-nourished, and in no distress.   HENT:  Head: Normocephalic and atraumatic.  Right Ear: External ear normal.  Left Ear: External ear normal.  Nose: Nose normal.  Mouth/Throat: Oropharynx is clear and moist. No oropharyngeal exudate.  Tympanic membranes within normal limits bilaterally.  Eyes: Conjunctivae and EOM are normal. Pupils are equal, round, and reactive to light.  Neck: Neck supple.  Cardiovascular: Normal rate, regular rhythm, normal heart sounds and intact distal pulses.   Pulmonary/Chest: Effort normal and breath sounds normal. No respiratory distress. He has no wheezes. He has no rales. He exhibits no tenderness.  Abdominal: Soft. He exhibits no distension and no mass. Bowel sounds are hypoactive. There is no tenderness. There is no rebound and no guarding. Hernia confirmed negative in the umbilical area, confirmed negative in the ventral area, confirmed negative in the right inguinal area and confirmed negative in the left inguinal area.  No ventral or umbilical hernia noted on exam.  Genitourinary: Penis normal. He exhibits no abnormal testicular mass, no testicular tenderness, no abnormal scrotal mass, no scrotal tenderness and no epididymal tenderness. No discharge found.  Musculoskeletal: Normal range of motion.  Lymphadenopathy:    He has no cervical adenopathy.  Neurological: He is alert and oriented to person, place, and time.  Skin: Skin is warm and dry. No rash noted.  Psychiatric: Affect normal.     Assessment/Plan: Encounter to establish care with new doctor Will obtain fasting labs.  Flu shot already received.  Pneumonia vaccine given by nursing staff.  Patient UTD on other health maintenance parameters.  Abdominal pain, unspecified site No mass, hernia or tenderness noted on abdominal or GU examination.  Will obtain DG abdomen giving hypoactive bowel sounds and history of abdominal surgery.  Patient asymptomatic at present.  Patient advised to rest, ibuprofen for pain, and to return to clinic  if symptoms are not resolving.  Will need further imaging if symptoms at that time.  Will also obtain CBC, CMP today.  HYPERLIPIDEMIA-MIXED Will obtain fasting lipid profile.  BRONCHITIS, CHRONIC Rx Albuterol rescue inhaler.  Will obtain records from prior PCP to review PFT results and treat accordingly.  GERD Trial of Nexium.  Advise diet, exercise and weight loss. Follow-up in 4 weeks.

## 2013-02-19 ENCOUNTER — Telehealth: Payer: Self-pay | Admitting: Physician Assistant

## 2013-02-19 DIAGNOSIS — E785 Hyperlipidemia, unspecified: Secondary | ICD-10-CM

## 2013-02-19 LAB — URINALYSIS, ROUTINE W REFLEX MICROSCOPIC
Glucose, UA: NEGATIVE mg/dL
Hgb urine dipstick: NEGATIVE
Leukocytes, UA: NEGATIVE
Specific Gravity, Urine: 1.016 (ref 1.005–1.030)
pH: 6 (ref 5.0–8.0)

## 2013-02-19 LAB — PSA: PSA: 0.55 ng/mL (ref <=4.00)

## 2013-02-19 NOTE — Telephone Encounter (Signed)
Attempt to reach pt via phone, line busy/SLS  

## 2013-02-19 NOTE — Telephone Encounter (Signed)
Please inform patient his labs look good.  I have placed the order for him to come back to lab fasting to get his lipid panel drawn.

## 2013-02-20 DIAGNOSIS — R109 Unspecified abdominal pain: Secondary | ICD-10-CM | POA: Insufficient documentation

## 2013-02-20 DIAGNOSIS — Z7689 Persons encountering health services in other specified circumstances: Secondary | ICD-10-CM | POA: Insufficient documentation

## 2013-02-20 NOTE — Telephone Encounter (Signed)
Patient informed, understood & agreed/SLS  

## 2013-02-20 NOTE — Assessment & Plan Note (Signed)
Trial of Nexium.  Advise diet, exercise and weight loss. Follow-up in 4 weeks.

## 2013-02-20 NOTE — Assessment & Plan Note (Signed)
Rx Albuterol rescue inhaler.  Will obtain records from prior PCP to review PFT results and treat accordingly.

## 2013-02-20 NOTE — Assessment & Plan Note (Signed)
Will obtain fasting lipid profile. 

## 2013-02-20 NOTE — Assessment & Plan Note (Signed)
No mass, hernia or tenderness noted on abdominal or GU examination.  Will obtain DG abdomen giving hypoactive bowel sounds and history of abdominal surgery.  Patient asymptomatic at present.  Patient advised to rest, ibuprofen for pain, and to return to clinic if symptoms are not resolving.  Will need further imaging if symptoms at that time.  Will also obtain CBC, CMP today.

## 2013-02-20 NOTE — Assessment & Plan Note (Signed)
Will obtain fasting labs.  Flu shot already received.  Pneumonia vaccine given by nursing staff.  Patient UTD on other health maintenance parameters.

## 2013-03-03 ENCOUNTER — Ambulatory Visit: Payer: Self-pay | Admitting: Family

## 2013-03-20 ENCOUNTER — Ambulatory Visit: Payer: MEDICARE | Admitting: Physician Assistant

## 2014-07-15 ENCOUNTER — Encounter: Payer: Self-pay | Admitting: Family

## 2014-07-15 ENCOUNTER — Ambulatory Visit (INDEPENDENT_AMBULATORY_CARE_PROVIDER_SITE_OTHER): Payer: Medicare HMO | Admitting: Family

## 2014-07-15 VITALS — BP 122/82 | HR 72 | Temp 97.6°F | Resp 18 | Ht 71.0 in | Wt 223.4 lb

## 2014-07-15 DIAGNOSIS — J029 Acute pharyngitis, unspecified: Secondary | ICD-10-CM | POA: Insufficient documentation

## 2014-07-15 DIAGNOSIS — J410 Simple chronic bronchitis: Secondary | ICD-10-CM | POA: Diagnosis not present

## 2014-07-15 MED ORDER — MOMETASONE FURO-FORMOTEROL FUM 200-5 MCG/ACT IN AERO: 2.0000 | INHALATION_SPRAY | Freq: Two times a day (BID) | RESPIRATORY_TRACT | Status: DC

## 2014-07-15 MED ORDER — CEFUROXIME AXETIL 250 MG PO TABS: 250.0000 mg | ORAL_TABLET | Freq: Two times a day (BID) | ORAL | Status: DC

## 2014-07-15 NOTE — Assessment & Plan Note (Signed)
Symptoms and exam consistent with potential pharyngitis. Cannot rule out bacterial pharyngitis. Given recent dental work, start [facility]. Continue over-the-counter medications as needed for symptom relief and supportive care. Follow-up if symptoms worsen or fail to improve.

## 2014-07-15 NOTE — Progress Notes (Signed)
   Subjective:    Patient ID: Brandon Schultz, male    DOB: 12-31-1950, 64 y.o.   MRN: [id number]  Chief Complaint  Patient presents with  . Sore Throat    x1 week, Says he just got some dental work done, throat is a little sore, and says he feels bumps in the back but not white bumps, says he also has sinus drainage, would like to try takind dulera instead of albuterol    HPI:  Brandon Schultz is a 64 y.o. male who presents today for an acute visit.   This is a new problem. Associated symptom sore throat has been going on for about a week. He recently had some dental work done. Pain is described as a soreness and is rated about 5/10. Also notes some congestion and drainage as well. Denies any modifying factors. Was recently on antibiotics for his dental work.   No Known Allergies  Current Outpatient Prescriptions on File Prior to Visit  Medication Sig Dispense Refill  . albuterol (PROVENTIL HFA;VENTOLIN HFA) 108 (90 BASE) MCG/ACT inhaler Inhale 2 puffs into the lungs every 6 (six) hours as needed for wheezing or shortness of breath. 1 Inhaler 0   No current facility-administered medications on file prior to visit.    Review of Systems  Constitutional: Negative for fever and chills.  HENT: Positive for congestion and sore throat. Negative for ear pain and sinus pressure.   Respiratory: Negative for cough.       Objective:    BP 122/82 mmHg  Pulse 72  Temp(Src) 97.6 F (36.4 C) (Oral)  Resp 18  Ht 5\' 11"  (1.803 m)  Wt 223 lb 6.4 oz (101.334 kg)  BMI 31.17 kg/m2  SpO2 97% Nursing note and vital signs reviewed.  Physical Exam  Constitutional: He is oriented to person, place, and time. He appears well-developed and well-nourished. No distress.  HENT:  Right Ear: Hearing, tympanic membrane, external ear and ear canal normal.  Left Ear: Hearing, tympanic membrane, external ear and ear canal normal.  Nose: Right sinus exhibits no maxillary sinus tenderness and no frontal  sinus tenderness. Left sinus exhibits no maxillary sinus tenderness and no frontal sinus tenderness.  Mouth/Throat: Uvula is midline and mucous membranes are normal. Posterior oropharyngeal erythema present. No oropharyngeal exudate or tonsillar abscesses.  Cardiovascular: Normal rate, regular rhythm, normal heart sounds and intact distal pulses.   Pulmonary/Chest: Effort normal and breath sounds normal.  Neurological: He is alert and oriented to person, place, and time.  Skin: Skin is warm and dry.  Psychiatric: He has a normal mood and affect. His behavior is normal. Judgment and thought content normal.       Assessment & Plan:

## 2014-07-15 NOTE — Assessment & Plan Note (Signed)
Mometasone-formoterol prescription written per patient request.

## 2014-07-15 NOTE — Progress Notes (Signed)
Pre visit review using our clinic review tool, if applicable. No additional management support is needed unless otherwise documented below in the visit note. 

## 2014-07-15 NOTE — Patient Instructions (Signed)
Thank you for choosing [facility].  Summary/Instructions:  Your prescription(s) have been submitted to your pharmacy or been printed and provided for you. Please take as directed and contact our office if you believe you are having problem(s) with the medication(s) or have any questions.   If your symptoms worsen or fail to improve, please contact our office for further instruction, or in case of emergency go directly to the emergency room at the closest medical facility.    Pharyngitis Pharyngitis is redness, pain, and swelling (inflammation) of your pharynx.  CAUSES  Pharyngitis is usually caused by infection. Most of the time, these infections are from viruses (viral) and are part of a cold. However, sometimes pharyngitis is caused by bacteria (bacterial). Pharyngitis can also be caused by allergies. Viral pharyngitis may be spread from person to person by coughing, sneezing, and personal items or utensils (cups, forks, spoons, toothbrushes). Bacterial pharyngitis may be spread from person to person by more intimate contact, such as kissing.  SIGNS AND SYMPTOMS  Symptoms of pharyngitis include:   Sore throat.   Tiredness (fatigue).   Low-grade fever.   Headache.  Joint pain and muscle aches.  Skin rashes.  Swollen lymph nodes.  Plaque-like film on throat or tonsils (often seen with bacterial pharyngitis). DIAGNOSIS  Your health care provider will ask you questions about your illness and your symptoms. Your medical history, along with a physical exam, is often all that is needed to diagnose pharyngitis. Sometimes, a rapid strep test is done. Other lab tests may also be done, depending on the suspected cause.  TREATMENT  Viral pharyngitis will usually get better in 3-4 days without the use of medicine. Bacterial pharyngitis is treated with medicines that kill germs (antibiotics).  HOME CARE INSTRUCTIONS   Drink enough water and fluids to keep your urine clear or  pale yellow.   Only take over-the-counter or prescription medicines as directed by your health care provider:   If you are prescribed antibiotics, make sure you finish them even if you start to feel better.   Do not take aspirin.   Get lots of rest.   Gargle with 8 oz of salt water ( tsp of salt per 1 qt of water) as often as every 1-2 hours to soothe your throat.   Throat lozenges (if you are not at risk for choking) or sprays may be used to soothe your throat. SEEK MEDICAL CARE IF:   You have large, tender lumps in your neck.  You have a rash.  You cough up green, yellow-brown, or bloody spit. SEEK IMMEDIATE MEDICAL CARE IF:   Your neck becomes stiff.  You drool or are unable to swallow liquids.  You vomit or are unable to keep medicines or liquids down.  You have severe pain that does not go away with the use of recommended medicines.  You have trouble breathing (not caused by a stuffy nose). MAKE SURE YOU:   Understand these instructions.  Will watch your condition.  Will get help right away if you are not doing well or get worse. Document Released: 04/02/2005 Document Revised: 01/21/2013 Document Reviewed: 12/08/2012 [facility] Patient Information 2015 [place], [place]. This information is not intended to replace advice given to you by your health care provider. Make sure you discuss any questions you have with your health care provider.

## 2014-07-21 ENCOUNTER — Telehealth: Payer: Self-pay

## 2014-07-21 MED ORDER — FLUTICASONE-SALMETEROL 250-50 MCG/DOSE IN AEPB: 1.0000 | INHALATION_SPRAY | Freq: Two times a day (BID) | RESPIRATORY_TRACT | Status: DC

## 2014-07-21 NOTE — Telephone Encounter (Signed)
Pt wife called back in about this med.  She said that he is completely out .

## 2014-07-21 NOTE — Telephone Encounter (Signed)
Pt aware.

## 2014-07-21 NOTE — Telephone Encounter (Signed)
Brandon Schultz is not covered by insurance. The dosage is not mattering. Pharmacy said that insurance will cover advair. Please advise.

## 2014-07-21 NOTE — Telephone Encounter (Signed)
Advair prescription sent.

## 2014-07-27 ENCOUNTER — Telehealth: Payer: Self-pay

## 2014-07-27 NOTE — Telephone Encounter (Signed)
Reviewed hx; Call to patient to introduce Medicare wellness visit. Confirmed the patient would like to establish primary care at this location; Last Medicare CPE 02/2013.  States he will be at home after 4:30pm and to call and schedule medicare wellness visit with Terri Piedra.  Schedulers will outreach for an apt for CPE/ and Annual Medicare wellness visit

## 2014-07-28 ENCOUNTER — Telehealth: Payer: Self-pay

## 2014-07-28 NOTE — Telephone Encounter (Signed)
Call to patient and agrees to Terri Piedra to follow for physical. Scheduled for 2/19 at 2:30 and agreed to come in early at 2pm for AWV prior to CPE with Marya Amsler.

## 2014-08-03 ENCOUNTER — Ambulatory Visit: Payer: Medicare HMO | Admitting: Family

## 2014-08-03 DIAGNOSIS — Z0289 Encounter for other administrative examinations: Secondary | ICD-10-CM

## 2014-08-18 ENCOUNTER — Encounter: Payer: Self-pay | Admitting: Family

## 2014-08-18 ENCOUNTER — Ambulatory Visit (INDEPENDENT_AMBULATORY_CARE_PROVIDER_SITE_OTHER): Payer: Medicare HMO | Admitting: Family

## 2014-08-18 VITALS — BP 104/78 | HR 69 | Temp 97.7°F | Resp 18 | Ht 71.0 in | Wt 220.4 lb

## 2014-08-18 DIAGNOSIS — Z0000 Encounter for general adult medical examination without abnormal findings: Secondary | ICD-10-CM

## 2014-08-18 MED ORDER — IBUPROFEN 600 MG PO TABS: 600.0000 mg | ORAL_TABLET | Freq: Four times a day (QID) | ORAL | Status: DC | PRN

## 2014-08-18 NOTE — Progress Notes (Signed)
Pre visit review using our clinic review tool, if applicable. No additional management support is needed unless otherwise documented below in the visit note. 

## 2014-08-18 NOTE — Assessment & Plan Note (Signed)
Reviewed and updated patient's medical, surgical, family and social history. Medications and allergies were also reviewed. Basic screenings for depression, activities of daily living, hearing, cognition and safety were performed. Provider list was updated and health plan was provided to the patient.  

## 2014-08-18 NOTE — Progress Notes (Signed)
Subjective:    Patient ID: Brandon Schultz, male    DOB: 1950/05/30, 64 y.o.   MRN: [id number]  Chief Complaint  Patient presents with  . CPE    Not fasting    HPI:  Brandon Schultz is a 64 y.o. male who presents today for an annual wellness visit.   1) Health Maintenance -   Diet - Averages 3 meals per day; fruits, vegetables, proteins; limited fast and processed; caffeine 2-3 cups per day  Exercise - Walks occasional. No other structured exercise.   2) Preventative Exams / Immunizations:  Dental -- Up to date  Vision -- Up to date   Health Maintenance  Topic Date Due  . Hepatitis C Screening  17-Jan-1951  . HIV Screening  11/08/1966  . TETANUS/TDAP  11/08/1970  . ZOSTAVAX  11/08/2011  . INFLUENZA VACCINE  11/15/2014  . COLONOSCOPY  03/16/2022  Will check on shingles.    Immunization History  Administered Date(s) Administered  . Pneumococcal Conjugate-13 02/18/2013    RISK FACTORS  Tobacco History  Smoking status  . Former Smoker  Smokeless tobacco  . Never Used     Cardiac risk factors: male gender, obesity (BMI >= 30 kg/m2) and sedentary lifestyle.  Depression Screen  Q1: Over the past two weeks, have you felt down, depressed or hopeless? No  Q2: Over the past two weeks, have you felt little interest or pleasure in doing things? No  Have you lost interest or pleasure in daily life? No  Do you often feel hopeless? No  Do you cry easily over simple problems? No  Activities of Daily Living In your present state of health, do you have any difficulty performing the following activities?:  Driving? No Managing money?  No Feeding yourself? No Getting from bed to chair? No Climbing a flight of stairs? No Preparing food and eating?: No Bathing or showering? No Getting dressed: No Getting to the toilet? No Using the toilet: No Moving around from place to place: No In the past year have you fallen or had a near fall?:No   Home Safety Has  smoke detector and wears seat belts. No firearms. No excess sun exposure. Are there smokers in your home (other than you)?  No Do you feel safe at home?  Yes  Hearing Difficulties: No Do you often ask people to speak up or repeat themselves? No Do you experience ringing or noises in your ears? No  Do you have difficulty understanding soft or whispered voices? No    Cognitive Testing  Alert? Yes   Normal Appearance? Yes  Oriented to person? Yes  Place? Yes   Time? Yes  Recall of three objects?  Yes  Can perform simple calculations? Yes  Displays appropriate judgment? Yes  Can read the correct time from a watch face? Yes  Do you feel that you have a problem with memory? No  Do you often misplace items? No   Advanced Directives have been discussed with the patient? Indicates he has a will.  Current Physicians/Providers and Suppliers  1. Terri Piedra, FNP - Primary Care / PCP 2. Laroy Apple, MD - Orthopedics    Indicate any recent Medical Services you may have received from other than [facility] providers in the past year (date may be approximate).  All answers were reviewed with the patient and necessary referrals were made:  Mauricio Po, [place]   08/18/2014   No Known Allergies  Current Outpatient Prescriptions on File Prior to Visit  Medication Sig Dispense Refill  . albuterol (PROVENTIL HFA;VENTOLIN HFA) 108 (90 BASE) MCG/ACT inhaler Inhale 2 puffs into the lungs every 6 (six) hours as needed for wheezing or shortness of breath. 1 Inhaler 0  . cefUROXime (CEFTIN) 250 MG tablet Take 1 tablet (250 mg total) by mouth 2 (two) times daily with a meal. 20 tablet 0  . Fluticasone-Salmeterol (ADVAIR DISKUS) 250-50 MCG/DOSE AEPB Inhale 1 puff into the lungs 2 (two) times daily. 1 each 3   No current facility-administered medications on file prior to visit.    Past Medical History  Diagnosis Date  . H/O degenerative disc disease   . Gum disease   . Colon polyps   . Asthma   .  Umbilical hernia   . COPD (chronic obstructive pulmonary disease)   . Inguinal hernia   . Emphysema   . Diverticulitis   . Renal disease     Bright's Disease-childhood  . Syncope   . Melanoma     facial  . Hemorrhoids   . Arthritis   . Colon polyps   . GERD (gastroesophageal reflux disease)     Past Surgical History  Procedure Laterality Date  . Hernia repair      Umbilical & Inguinal  . Left knee surgery    . Neck and shoulder injection    . Wisdom tooth extraction    . Melanoma excision      facial    Family History  Problem Relation Age of Onset  . Colon cancer Mother   . Heart attack Father     Deceased-Early 64s  . Cancer Maternal Grandfather   . Dementia Maternal Grandmother   . Syncope episode Sister   . Healthy Sister     x1  . Healthy Daughter     x2  . Healthy Son     x2    History   Social History  . Marital Status: Married    Spouse Name: N/A  . Number of Children: N/A  . Years of Education: N/A   Occupational History  . Not on file.   Social History Main Topics  . Smoking status: Former Research scientist (life sciences)  . Smokeless tobacco: Never Used  . Alcohol Use: Yes     Comment: rare -- beer  . Drug Use: No  . Sexual Activity: Not on file   Other Topics Concern  . Not on file   Social History Narrative    Review of Systems  Constitutional: Denies fever, chills, fatigue, or significant weight gain/loss. HENT: Head: Denies headache or neck pain Ears: Denies changes in hearing, ringing in ears, earache, drainage Nose: Denies discharge, stuffiness, itching, nosebleed, sinus pain Throat: Denies sore throat, hoarseness, dry mouth, sores, thrush Eyes: Denies loss/changes in vision, pain, redness, blurry/double vision, flashing lights Cardiovascular: Denies chest pain/discomfort, tightness, palpitations, shortness of breath with activity, difficulty lying down, swelling, sudden awakening with shortness of breath Respiratory: Denies shortness of breath,  cough, sputum production, wheezing Gastrointestinal: Denies dysphasia, heartburn, change in appetite, nausea, change in bowel habits, rectal bleeding, constipation, diarrhea, yellow skin or eyes Genitourinary: Denies frequency, urgency, burning/pain, blood in urine, incontinence, change in urinary strength. Musculoskeletal: Denies muscle/joint pain, stiffness, back pain, redness or swelling of joints, trauma Skin: Denies rashes, lumps, itching, dryness, color changes, or hair/nail changes Neurological: Denies dizziness, fainting, seizures, weakness, numbness, tingling, tremor Psychiatric - Denies nervousness, stress, depression or memory loss Endocrine: Denies heat or cold intolerance, sweating, frequent urination, excessive thirst, changes in appetite Hematologic: Denies  ease of bruising or bleeding    Objective:     BP 104/78 mmHg  Pulse 69  Temp(Src) 97.7 F (36.5 C) (Oral)  Resp 18  Ht 5\' 11"  (1.803 m)  Wt 220 lb 6.4 oz (99.973 kg)  BMI 30.75 kg/m2  SpO2 95% Nursing note and vital signs reviewed.  Physical Exam  Constitutional: He is oriented to person, place, and time. He appears well-developed and well-nourished.  HENT:  Head: Normocephalic.  Right Ear: Hearing, tympanic membrane, external ear and ear canal normal.  Left Ear: Hearing, tympanic membrane, external ear and ear canal normal.  Nose: Nose normal.  Mouth/Throat: Uvula is midline, oropharynx is clear and moist and mucous membranes are normal.  Eyes: Conjunctivae and EOM are normal. Pupils are equal, round, and reactive to light.  Neck: Neck supple. No JVD present. No tracheal deviation present. No thyromegaly present.  Cardiovascular: Normal rate, regular rhythm, normal heart sounds and intact distal pulses.   Pulmonary/Chest: Effort normal and breath sounds normal.  Abdominal: Soft. Bowel sounds are normal. He exhibits no distension and no mass. There is no tenderness. There is no rebound and no guarding.    Musculoskeletal: Normal range of motion. He exhibits no edema or tenderness.  Lymphadenopathy:    He has no cervical adenopathy.  Neurological: He is alert and oriented to person, place, and time. He has normal reflexes. No cranial nerve deficit. He exhibits normal muscle tone. Coordination normal.  Skin: Skin is warm and dry.  Psychiatric: He has a normal mood and affect. His behavior is normal. Judgment and thought content normal.       Assessment & Plan:   During the course of the visit the patient was educated and counseled about appropriate screening and preventive services including:    Pneumococcal vaccine   Prostate cancer screening  Colorectal cancer screening  Nutrition counseling   Advanced directives: has an advanced directive - a copy HAS NOT been provided.  Diet review for nutrition referral? Yes ____  Not Indicated _X___   Patient Instructions (the written plan) was given to the patient.  Medicare Attestation I have personally reviewed: The patient's medical and social history Their use of alcohol, tobacco or illicit drugs Their current medications and supplements The patient's functional ability including ADLs,fall risks, home safety risks, cognitive, and hearing and visual impairment Diet and physical activities Evidence for depression or mood disorders  The patient's weight, height, BMI,  have been recorded in the chart.  I have made referrals, counseling, and provided education to the patient based on review of the above and I have provided the patient with a written personalized care plan for preventive services.     Mauricio Po, [place]   08/18/2014

## 2014-08-18 NOTE — Patient Instructions (Addendum)
Thank you for choosing [facility].  Summary/Instructions:  Your prescription(s) have been submitted to your pharmacy or been printed and provided for you. Please take as directed and contact our office if you believe you are having problem(s) with the medication(s) or have any questions.  Please stop by the lab on the basement level of the building for your blood work. Your results will be released to [place] (or called to you) after review, usually within 72 hours after test completion. If any changes need to be made, you will be notified at that same time.   Health Maintenance A healthy lifestyle and preventative care can promote health and wellness.  Maintain regular health, dental, and eye exams.  Eat a healthy diet. Foods like vegetables, fruits, whole grains, low-fat dairy products, and lean protein foods contain the nutrients you need and are low in calories. Decrease your intake of foods high in solid fats, added sugars, and salt. Get information about a proper diet from your health care provider, if necessary.  Regular physical exercise is one of the most important things you can do for your health. Most adults should get at least 150 minutes of moderate-intensity exercise (any activity that increases your heart rate and causes you to sweat) each week. In addition, most adults need muscle-strengthening exercises on 2 or more days a week.   Maintain a healthy weight. The body mass index (BMI) is a screening tool to identify possible weight problems. It provides an estimate of body fat based on height and weight. Your health care provider can find your BMI and can help you achieve or maintain a healthy weight. For males 20 years and older:  A BMI below 18.5 is considered underweight.  A BMI of 18.5 to 24.9 is normal.  A BMI of 25 to 29.9 is considered overweight.  A BMI of 30 and above is considered obese.  Maintain normal blood lipids and cholesterol by exercising and  minimizing your intake of saturated fat. Eat a balanced diet with plenty of fruits and vegetables. Blood tests for lipids and cholesterol should begin at age 68 and be repeated every 5 years. If your lipid or cholesterol levels are high, you are over age 43, or you are at high risk for heart disease, you may need your cholesterol levels checked more frequently.Ongoing high lipid and cholesterol levels should be treated with medicines if diet and exercise are not working.  If you smoke, find out from your health care provider how to quit. If you do not use tobacco, do not start.  Lung cancer screening is recommended for adults aged 60-80 years who are at high risk for developing lung cancer because of a history of smoking. A yearly low-dose CT scan of the lungs is recommended for people who have at least a 30-pack-year history of smoking and are current smokers or have quit within the past 15 years. A pack year of smoking is smoking an average of 1 pack of cigarettes a day for 1 year (for example, a 30-pack-year history of smoking could mean smoking 1 pack a day for 30 years or 2 packs a day for 15 years). Yearly screening should continue until the smoker has stopped smoking for at least 15 years. Yearly screening should be stopped for people who develop a health problem that would prevent them from having lung cancer treatment.  If you choose to drink alcohol, do not have more than 2 drinks per day. One drink is considered to be  12 oz (360 mL) of beer, 5 oz (150 mL) of wine, or 1.5 oz (45 mL) of liquor.  Avoid the use of street drugs. Do not share needles with anyone. Ask for help if you need support or instructions about stopping the use of drugs.  High blood pressure causes heart disease and increases the risk of stroke. Blood pressure should be checked at least every 1-2 years. Ongoing high blood pressure should be treated with medicines if weight loss and exercise are not effective.  If you are  32-44 years old, ask your health care provider if you should take aspirin to prevent heart disease.  Diabetes screening involves taking a blood sample to check your fasting blood sugar level. This should be done once every 3 years after age 57 if you are at a normal weight and without risk factors for diabetes. Testing should be considered at a younger age or be carried out more frequently if you are overweight and have at least 1 risk factor for diabetes.  Colorectal cancer can be detected and often prevented. Most routine colorectal cancer screening begins at the age of 60 and continues through age 9. However, your health care provider may recommend screening at an earlier age if you have risk factors for colon cancer. On a yearly basis, your health care provider may provide home test kits to check for hidden blood in the stool. A small camera at the end of a tube may be used to directly examine the colon (sigmoidoscopy or colonoscopy) to detect the earliest forms of colorectal cancer. Talk to your health care provider about this at age 96 when routine screening begins. A direct exam of the colon should be repeated every 5-10 years through age 79, unless early forms of precancerous polyps or small growths are found.  People who are at an increased risk for hepatitis B should be screened for this virus. You are considered at high risk for hepatitis B if:  You were born in a country where hepatitis B occurs often. Talk with your health care provider about which countries are considered high risk.  Your parents were born in a high-risk country and you have not received a shot to protect against hepatitis B (hepatitis B vaccine).  You have HIV or AIDS.  You use needles to inject street drugs.  You live with, or have sex with, someone who has hepatitis B.  You are a man who has sex with other men (MSM).  You get hemodialysis treatment.  You take certain medicines for conditions like cancer, organ  transplantation, and autoimmune conditions.  Hepatitis C blood testing is recommended for all people born from [birth year] through 1964 and any individual with known risk factors for hepatitis C.  Healthy men should no longer receive prostate-specific antigen (PSA) blood tests as part of routine cancer screening. Talk to your health care provider about prostate cancer screening.  Testicular cancer screening is not recommended for adolescents or adult males who have no symptoms. Screening includes self-exam, a health care provider exam, and other screening tests. Consult with your health care provider about any symptoms you have or any concerns you have about testicular cancer.  Practice safe sex. Use condoms and avoid high-risk sexual practices to reduce the spread of sexually transmitted infections (STIs).  You should be screened for STIs, including gonorrhea and chlamydia if:  You are sexually active and are younger than 24 years.  You are older than 24 years, and your health care provider  tells you that you are at risk for this type of infection.  Your sexual activity has changed since you were last screened, and you are at an increased risk for chlamydia or gonorrhea. Ask your health care provider if you are at risk.  If you are at risk of being infected with HIV, it is recommended that you take a prescription medicine daily to prevent HIV infection. This is called pre-exposure prophylaxis (PrEP). You are considered at risk if:  You are a man who has sex with other men (MSM).  You are a heterosexual man who is sexually active with multiple partners.  You take drugs by injection.  You are sexually active with a partner who has HIV.  Talk with your health care provider about whether you are at high risk of being infected with HIV. If you choose to begin PrEP, you should first be tested for HIV. You should then be tested every 3 months for as long as you are taking PrEP.  Use sunscreen. Apply  sunscreen liberally and repeatedly throughout the day. You should seek shade when your shadow is shorter than you. Protect yourself by wearing long sleeves, pants, a wide-brimmed hat, and sunglasses year round whenever you are outdoors.  Tell your health care provider of new moles or changes in moles, especially if there is a change in shape or color. Also, tell your health care provider if a mole is larger than the size of a pencil eraser.  A one-time screening for abdominal aortic aneurysm (AAA) and surgical repair of large AAAs by ultrasound is recommended for men aged 40-75 years who are current or former smokers.  Stay current with your vaccines (immunizations). Document Released: 09/29/2007 Document Revised: 04/07/2013 Document Reviewed: 08/28/2010 [facility] Patient Information 2015 [place], [place]. This information is not intended to replace advice given to you by your health care provider. Make sure you discuss any questions you have with your health care provider.   Advance Directive Advance directives are the legal documents that allow you to make choices about your health care and medical treatment if you cannot speak for yourself. Advance directives are a way for you to communicate your wishes to family, friends, and health care providers. The specified people can then convey your decisions about end-of-life care to avoid confusion if you should become unable to communicate. Ideally, the process of discussing and writing advance directives should happen over time rather than making decisions all at once. Advance directives can be modified as your situation changes, and you can change your mind at any time, even after you have signed the advance directives. Each state has its own laws regarding advance directives. You may want to check with your health care provider, attorney, or state representative about the law in your state. Below are some examples of advance directives. LIVING WILL A  living will is a set of instructions documenting your wishes about medical care when you cannot care for yourself. It is used if you become:  Terminally ill.  Incapacitated.  Unable to communicate.  Unable to make decisions. Items to consider in your living will include:  The use or non-use of life-sustaining equipment, such as dialysis machines and breathing machines (ventilators).  A do not resuscitate (DNR) order, which is the instruction not to use cardiopulmonary resuscitation (CPR) if breathing or heartbeat stops.  Tube feeding.  Withholding of food and fluids.  Comfort (palliative) care when the goal becomes comfort rather than a cure.  Organ and tissue donation.  A living will does not give instructions about distribution of your money and property if you should pass away. It is advisable to seek the expert advice of a lawyer in drawing up a will regarding your possessions. Decisions about taxes, beneficiaries, and asset distribution will be legally binding. This process can relieve your family and friends of any burdens surrounding disputes or questions that may come up about the allocation of your assets. DO NOT RESUSCITATE (DNR) A do not resuscitate (DNR) order is a request to not have CPR in the event that your heart stops beating or you stop breathing. Unless given other instructions, a health care provider will try to help any patient whose heart has stopped or who has stopped breathing.  HEALTH CARE PROXY AND DURABLE POWER OF ATTORNEY FOR HEALTH CARE A health care proxy is a person (agent) appointed to make medical decisions for you if you cannot. Generally, people choose someone they know well and trust to represent their preferences when they can no longer do so. You should be sure to ask this person for agreement to act as your agent. An agent may have to exercise judgment in the event of a medical decision for which your wishes are not known. The durable power of attorney  for health care is the legal document that names your health care proxy. Once written, it should be:  Signed.  Notarized.  Dated.  Copied.  Witnessed.  Incorporated into your medical record. You may also want to appoint someone to manage your financial affairs if you cannot. This is called a durable power of attorney for finances. It is a separate legal document from the durable power of attorney for health care. You may choose the same person or someone different from your health care proxy to act as your agent in financial matters. Document Released: 07/10/2007 Document Revised: 04/07/2013 Document Reviewed: 08/20/2012 [facility] Patient Information 2015 [place], [place]. This information is not intended to replace advice given to you by your health care provider. Make sure you discuss any questions you have with your health care provider.

## 2014-08-18 NOTE — Progress Notes (Deleted)
   Subjective:    Patient ID: Brandon Schultz, male    DOB: 1950/08/03, 64 y.o.   MRN: [id number]  HPI    Review of Systems     Objective:   Physical Exam        Assessment & Plan:

## 2014-08-19 ENCOUNTER — Other Ambulatory Visit (INDEPENDENT_AMBULATORY_CARE_PROVIDER_SITE_OTHER): Payer: Medicare HMO

## 2014-08-19 DIAGNOSIS — E782 Mixed hyperlipidemia: Secondary | ICD-10-CM

## 2014-08-19 DIAGNOSIS — Z0000 Encounter for general adult medical examination without abnormal findings: Secondary | ICD-10-CM

## 2014-08-19 DIAGNOSIS — Z125 Encounter for screening for malignant neoplasm of prostate: Secondary | ICD-10-CM

## 2014-08-19 LAB — LIPID PANEL
CHOL/HDL RATIO: 5
Cholesterol: 229 mg/dL — ABNORMAL HIGH (ref 0–200)
HDL: 44.1 mg/dL (ref >39.00)
LDL Cholesterol: 151 mg/dL — ABNORMAL HIGH (ref 0–99)
NONHDL: 184.9
Triglycerides: 169 mg/dL — ABNORMAL HIGH (ref 0.0–149.0)
VLDL: 33.8 mg/dL (ref 0.0–40.0)

## 2014-08-19 LAB — CBC
HCT: 45.6 % (ref 39.0–52.0)
Hemoglobin: 15.8 g/dL (ref 13.0–17.0)
MCHC: 34.7 g/dL (ref 30.0–36.0)
MCV: 86.8 fl (ref 78.0–100.0)
Platelets: 281 10*3/uL (ref 150.0–400.0)
RBC: 5.25 Mil/uL (ref 4.22–5.81)
RDW: 13.9 % (ref 11.5–15.5)
WBC: 8.2 10*3/uL (ref 4.0–10.5)

## 2014-08-19 LAB — BASIC METABOLIC PANEL
BUN: 14 mg/dL (ref 6–23)
CO2: 25 mEq/L (ref 19–32)
Calcium: 9.8 mg/dL (ref 8.4–10.5)
Chloride: 103 mEq/L (ref 96–112)
Creatinine, Ser: 0.97 mg/dL (ref 0.40–1.50)
GFR: 83.14 mL/min (ref >60.00)
Glucose, Bld: 98 mg/dL (ref 70–99)
Potassium: 4.3 mEq/L (ref 3.5–5.1)
SODIUM: 137 meq/L (ref 135–145)

## 2014-08-19 LAB — PSA: PSA: 0.58 ng/mL (ref 0.10–4.00)

## 2014-08-19 LAB — TSH: TSH: 1.07 u[IU]/mL (ref 0.35–4.50)

## 2014-08-20 ENCOUNTER — Encounter: Payer: Self-pay | Admitting: Family

## 2015-02-21 DIAGNOSIS — Z23 Encounter for immunization: Secondary | ICD-10-CM | POA: Diagnosis not present

## 2015-02-22 DIAGNOSIS — H5213 Myopia, bilateral: Secondary | ICD-10-CM | POA: Diagnosis not present

## 2015-03-21 DIAGNOSIS — H02831 Dermatochalasis of right upper eyelid: Secondary | ICD-10-CM | POA: Diagnosis not present

## 2015-03-21 DIAGNOSIS — H02834 Dermatochalasis of left upper eyelid: Secondary | ICD-10-CM | POA: Diagnosis not present

## 2015-05-25 ENCOUNTER — Encounter (HOSPITAL_COMMUNITY): Payer: Self-pay | Admitting: *Deleted

## 2015-05-25 ENCOUNTER — Emergency Department (HOSPITAL_COMMUNITY)
Admission: EM | Admit: 2015-05-25 | Discharge: 2015-05-25 | Disposition: A | Discharge status: Home / Self Care | Payer: Medicare HMO | Source: Home / Self Care | Attending: Family Medicine | Admitting: Family Medicine

## 2015-05-25 DIAGNOSIS — S91209A Unspecified open wound of unspecified toe(s) with damage to nail, initial encounter: Secondary | ICD-10-CM | POA: Diagnosis not present

## 2015-05-25 MED ORDER — BUPIVACAINE HCL (PF) 0.5 % IJ SOLN
INTRAMUSCULAR | Status: AC
  Filled 2015-05-25: qty 10

## 2015-05-25 MED ORDER — HYDROCODONE-ACETAMINOPHEN 5-325 MG PO TABS: 1.0000 | ORAL_TABLET | Freq: Four times a day (QID) | ORAL | Status: DC | PRN

## 2015-05-25 NOTE — ED Notes (Addendum)
Pt  Reports     Snagged  r  Toe  Nail      On  Object  This  Am       Bleeding  And  Nail bed  Involvement

## 2015-05-25 NOTE — ED Provider Notes (Signed)
CSN: [id number]     Arrival date & time 05/25/15  1259 History   First MD Initiated Contact with Patient 05/25/15 1308     Chief Complaint  Patient presents with  . Toe Pain   (Consider location/radiation/quality/duration/timing/severity/associated sxs/prior Treatment) Patient is a 65 y.o. male presenting with toe pain. The history is provided by the patient.  Toe Pain This is a new problem. The current episode started 1 to 2 hours ago (near complete right gt toenail avulsion this am on furniture,needs help with removal.). The problem has not changed since onset.   Past Medical History  Diagnosis Date  . H/O degenerative disc disease   . Gum disease   . Colon polyps   . Asthma   . Umbilical hernia   . COPD (chronic obstructive pulmonary disease) ([place])   . Inguinal hernia   . Emphysema   . Diverticulitis   . Renal disease     Bright's Disease-childhood  . Syncope   . Melanoma ([place])     facial  . Hemorrhoids   . Arthritis   . Colon polyps   . GERD (gastroesophageal reflux disease)    Past Surgical History  Procedure Laterality Date  . Hernia repair      Umbilical & Inguinal  . Left knee surgery    . Neck and shoulder injection    . Wisdom tooth extraction    . Melanoma excision      facial   Family History  Problem Relation Age of Onset  . Colon cancer Mother   . Heart attack Father     Deceased-Early 64s  . Cancer Maternal Grandfather   . Dementia Maternal Grandmother   . Syncope episode Sister   . Healthy Sister     x1  . Healthy Daughter     x2  . Healthy Son     x2   Social History  Substance Use Topics  . Smoking status: Former Research scientist (life sciences)  . Smokeless tobacco: Never Used  . Alcohol Use: Yes     Comment: rare -- beer    Review of Systems  Skin: Positive for wound.  All other systems reviewed and are negative.   Allergies  Review of patient's allergies indicates no known allergies.  Home Medications   Prior to Admission medications    Medication Sig Start Date End Date Taking? Authorizing Provider  albuterol (PROVENTIL HFA;VENTOLIN HFA) 108 (90 BASE) MCG/ACT inhaler Inhale 2 puffs into the lungs every 6 (six) hours as needed for wheezing or shortness of breath. 02/18/13   Brunetta Jeans, PA-C  cefUROXime (CEFTIN) 250 MG tablet Take 1 tablet (250 mg total) by mouth 2 (two) times daily with a meal. 07/15/14   Golden Circle, FNP  Fluticasone-Salmeterol (ADVAIR DISKUS) 250-50 MCG/DOSE AEPB Inhale 1 puff into the lungs 2 (two) times daily. 07/21/14   Golden Circle, FNP  HYDROcodone-acetaminophen (NORCO/VICODIN) 5-325 MG tablet Take 1 tablet by mouth every 6 (six) hours as needed. 05/25/15   Billy Fischer, MD  ibuprofen (ADVIL,MOTRIN) 600 MG tablet Take 1 tablet (600 mg total) by mouth every 6 (six) hours as needed. 08/18/14   Golden Circle, FNP   Meds Ordered and Administered this Visit  Medications - No data to display  BP 129/82 mmHg  Pulse 86  Temp(Src) 97.6 F (36.4 C) (Oral)  Resp 20  SpO2 96% No data found.   Physical Exam  Constitutional: He is oriented to person, place, and time. He appears well-developed  and well-nourished. No distress.  Musculoskeletal: He exhibits tenderness.  Near complete right gt toenail avulsion.  Neurological: He is alert and oriented to person, place, and time.  Skin: Skin is warm and dry.  Nursing note and vitals reviewed.   ED Course  .Nail Removal Date/Time: 05/25/2015 1:35 PM Performed by: Billy Fischer Authorized by: Ihor Gully D Consent: Verbal consent obtained. Consent given by: patient Location: right foot Location details: right big toe Anesthesia: local infiltration Local anesthetic: bupivacaine 0.5% without epinephrine Patient sedated: no Preparation: skin prepped with Betadine Amount removed: complete Wedge excision of skin of nail fold: no Nail bed sutured: no Nail matrix removed: none Removed nail replaced and anchored: no Dressing: Xeroform  gauze Patient tolerance: Patient tolerated the procedure well with no immediate complications Comments: Nailbed inact. H/o ingrown nail.   (including critical care time)  Labs Review Labs Reviewed - No data to display  Imaging Review No results found.   Visual Acuity Review  Right Eye Distance:   Left Eye Distance:   Bilateral Distance:    Right Eye Near:   Left Eye Near:    Bilateral Near:         MDM   1. Avulsion of toenail, initial encounter        Billy Fischer, MD 05/25/15 2103

## 2015-05-25 NOTE — Discharge Instructions (Signed)
Leave bandaged until sat, then soak daily until healed.

## 2015-07-04 DIAGNOSIS — H02831 Dermatochalasis of right upper eyelid: Secondary | ICD-10-CM | POA: Diagnosis not present

## 2015-07-04 DIAGNOSIS — H0262 Xanthelasma of right lower eyelid: Secondary | ICD-10-CM | POA: Diagnosis not present

## 2015-07-04 DIAGNOSIS — H0265 Xanthelasma of left lower eyelid: Secondary | ICD-10-CM | POA: Diagnosis not present

## 2015-07-04 DIAGNOSIS — H02834 Dermatochalasis of left upper eyelid: Secondary | ICD-10-CM | POA: Diagnosis not present

## 2015-07-27 ENCOUNTER — Telehealth: Payer: Self-pay

## 2015-07-27 NOTE — Telephone Encounter (Signed)
Call to schedule AWV with Marya Amsler and stated he would call back and schedule as he was going to be out of town for [employer]. Stated anytime after may 5th;

## 2015-09-06 NOTE — Telephone Encounter (Signed)
Patient is on my [employer] list for 2017. Pt may be a good candidate for an AWV for 2017 

## 2015-09-07 NOTE — Telephone Encounter (Signed)
Noted.  Message routed back to Wynetta Fines, [place] at [facility].

## 2015-09-07 NOTE — Telephone Encounter (Signed)
Pt has been seeing Mauricio Po

## 2015-09-09 ENCOUNTER — Telehealth: Payer: Self-pay

## 2015-09-09 NOTE — Telephone Encounter (Signed)
Call to this patient for AWV with myself or Marya Amsler; Stated [place], but will call when he gets back to town. Does not plan any trips to [place] at this time.

## 2016-01-01 DIAGNOSIS — N3090 Cystitis, unspecified without hematuria: Secondary | ICD-10-CM | POA: Diagnosis not present

## 2016-01-01 DIAGNOSIS — N3091 Cystitis, unspecified with hematuria: Secondary | ICD-10-CM | POA: Diagnosis not present

## 2016-02-24 DIAGNOSIS — R69 Illness, unspecified: Secondary | ICD-10-CM | POA: Diagnosis not present

## 2016-03-27 ENCOUNTER — Encounter: Payer: Medicare HMO | Admitting: Family

## 2016-03-29 ENCOUNTER — Ambulatory Visit (INDEPENDENT_AMBULATORY_CARE_PROVIDER_SITE_OTHER): Payer: Medicare HMO | Admitting: Family

## 2016-03-29 ENCOUNTER — Other Ambulatory Visit (INDEPENDENT_AMBULATORY_CARE_PROVIDER_SITE_OTHER): Payer: Medicare HMO

## 2016-03-29 ENCOUNTER — Encounter: Payer: Self-pay | Admitting: Family

## 2016-03-29 VITALS — BP 124/82 | HR 88 | Temp 97.6°F | Resp 16 | Ht 71.0 in | Wt 215.0 lb

## 2016-03-29 DIAGNOSIS — Z0000 Encounter for general adult medical examination without abnormal findings: Secondary | ICD-10-CM

## 2016-03-29 DIAGNOSIS — H9193 Unspecified hearing loss, bilateral: Secondary | ICD-10-CM

## 2016-03-29 DIAGNOSIS — M159 Polyosteoarthritis, unspecified: Secondary | ICD-10-CM

## 2016-03-29 DIAGNOSIS — Z7289 Other problems related to lifestyle: Secondary | ICD-10-CM

## 2016-03-29 DIAGNOSIS — E782 Mixed hyperlipidemia: Secondary | ICD-10-CM

## 2016-03-29 DIAGNOSIS — M150 Primary generalized (osteo)arthritis: Secondary | ICD-10-CM

## 2016-03-29 DIAGNOSIS — J410 Simple chronic bronchitis: Secondary | ICD-10-CM | POA: Diagnosis not present

## 2016-03-29 DIAGNOSIS — H9190 Unspecified hearing loss, unspecified ear: Secondary | ICD-10-CM | POA: Insufficient documentation

## 2016-03-29 DIAGNOSIS — H903 Sensorineural hearing loss, bilateral: Secondary | ICD-10-CM | POA: Insufficient documentation

## 2016-03-29 LAB — COMPREHENSIVE METABOLIC PANEL
ALBUMIN: 4.8 g/dL (ref 3.5–5.2)
ALT: 18 U/L (ref 0–53)
AST: 14 U/L (ref 0–37)
Alkaline Phosphatase: 54 U/L (ref 39–117)
BILIRUBIN TOTAL: 0.7 mg/dL (ref 0.2–1.2)
BUN: 17 mg/dL (ref 6–23)
CALCIUM: 9.7 mg/dL (ref 8.4–10.5)
CO2: 29 meq/L (ref 19–32)
Chloride: 104 mEq/L (ref 96–112)
Creatinine, Ser: 1.01 mg/dL (ref 0.40–1.50)
GFR: 78.95 mL/min (ref >60.00)
Glucose, Bld: 122 mg/dL — ABNORMAL HIGH (ref 70–99)
Potassium: 4 mEq/L (ref 3.5–5.1)
Sodium: 139 mEq/L (ref 135–145)
Total Protein: 7.4 g/dL (ref 6.0–8.3)

## 2016-03-29 LAB — PSA: PSA: 0.55 ng/mL (ref 0.10–4.00)

## 2016-03-29 LAB — HEPATITIS C ANTIBODY: HCV AB: NEGATIVE

## 2016-03-29 LAB — LIPID PANEL
CHOL/HDL RATIO: 5
CHOLESTEROL: 252 mg/dL — AB (ref 0–200)
HDL: 50.3 mg/dL (ref >39.00)
LDL CALC: 176 mg/dL — AB (ref 0–99)
NonHDL: 201.24
TRIGLYCERIDES: 125 mg/dL (ref 0.0–149.0)
VLDL: 25 mg/dL (ref 0.0–40.0)

## 2016-03-29 NOTE — Progress Notes (Signed)
Subjective:    Patient ID: Brandon Schultz, male    DOB: 09-23-1950, 65 y.o.   MRN: [id number]  Chief Complaint  Patient presents with  . CPE    fasting    HPI:  Brandon Schultz is a 65 y.o. male who presents today for a Medicare Annual Wellness/Physical exam.    1) Health Maintenance -   Diet - Averages about 3 meals per day consisting of a regular diet; Caffeine intake of 1-2 cups per day  Exercise - Around the house; No structured exercise  2) Preventative Exams / Immunizations:  Dental -- Up to date  Vision -- Up to date   Health Maintenance  Topic Date Due  . Hepatitis C Screening  03-13-1951  . HIV Screening  11/08/1966  . ZOSTAVAX  11/08/2011  . COLONOSCOPY  03/16/2017  . TETANUS/TDAP  07/23/2022  . INFLUENZA VACCINE  Addressed     Immunization History  Administered Date(s) Administered  . Pneumococcal Conjugate-13 02/18/2013    RISK FACTORS  Tobacco History  Smoking Status  . Former Smoker  Smokeless Tobacco  . Never Used     Cardiac risk factors: advanced age (older than 38 for men, 1 for women), dyslipidemia and male gender.  Depression Screen  Depression screen [facility] 2/9 03/29/2016  Decreased Interest 0  Down, Depressed, Hopeless 0  PHQ - 2 Score 0     Activities of Daily Living In your present state of health, do you have any difficulty performing the following activities?:  Driving? No Managing money?  No Feeding yourself? No Getting from bed to chair? No Climbing a flight of stairs? No Preparing food and eating?: No Bathing or showering? No Getting dressed: No Getting to the toilet? No Using the toilet: No Moving around from place to place: No In the past year have you fallen or had a near fall?:No   Home Safety Has smoke detector and wears seat belts. No firearms. No excess sun exposure. Are there smokers in your home (other than you)?  No Do you feel safe at home?  Yes  Hearing Difficulties: No Do you often ask  people to speak up or repeat themselves? Yes Do you experience ringing or noises in your ears? No  Do you have difficulty understanding soft or whispered voices?Yes    Cognitive Testing  Alert? Yes   Normal Appearance? Yes  Oriented to person? Yes  Place? Yes   Time? Yes  Recall of three objects?  Yes  Can perform simple calculations? Yes  Displays appropriate judgment? Yes  Can read the correct time from a watch face? Yes  Do you feel that you have a problem with memory? No  Do you often misplace items? No   Advanced Directives have been discussed with the patient? Yes   Current Physicians/Providers and Suppliers  1. Terri Piedra, FNP - Internal Medicine   Indicate any recent Medical Services you may have received from other than [facility] providers in the past year (date may be approximate).  All answers were reviewed with the patient and necessary referrals were made:  Mauricio Po, [place]   03/29/2016    No Known Allergies   Outpatient Medications Prior to Visit  Medication Sig Dispense Refill  . albuterol (PROVENTIL HFA;VENTOLIN HFA) 108 (90 BASE) MCG/ACT inhaler Inhale 2 puffs into the lungs every 6 (six) hours as needed for wheezing or shortness of breath. 1 Inhaler 0  . Fluticasone-Salmeterol (ADVAIR DISKUS) 250-50 MCG/DOSE AEPB Inhale 1 puff into  the lungs 2 (two) times daily. 1 each 3  . HYDROcodone-acetaminophen (NORCO/VICODIN) 5-325 MG tablet Take 1 tablet by mouth every 6 (six) hours as needed. 10 tablet 0  . ibuprofen (ADVIL,MOTRIN) 600 MG tablet Take 1 tablet (600 mg total) by mouth every 6 (six) hours as needed. 30 tablet 0  . cefUROXime (CEFTIN) 250 MG tablet Take 1 tablet (250 mg total) by mouth 2 (two) times daily with a meal. 20 tablet 0   No facility-administered medications prior to visit.      Past Medical History:  Diagnosis Date  . Arthritis   . Asthma   . Colon polyps   . Colon polyps   . COPD (chronic obstructive pulmonary disease) ([place])   .  Diverticulitis   . Emphysema   . GERD (gastroesophageal reflux disease)   . Gum disease   . H/O degenerative disc disease   . Hemorrhoids   . Inguinal hernia   . Melanoma ([place])    facial  . Renal disease    Bright's Disease-childhood  . Syncope   . Umbilical hernia   . Urinary tract infection      Past Surgical History:  Procedure Laterality Date  . HERNIA REPAIR     Umbilical & Inguinal  . left knee surgery    . MELANOMA EXCISION     facial  . neck and shoulder injection    . WISDOM TOOTH EXTRACTION       Family History  Problem Relation Age of Onset  . Colon cancer Mother   . Heart attack Father     Deceased-Early 87s  . Cancer Maternal Grandfather   . Dementia Maternal Grandmother   . Syncope episode Sister   . Healthy Sister     x1  . Healthy Daughter     x2  . Healthy Son     x2     Social History   Social History  . Marital status: Married    Spouse name: N/A  . Number of children: 4  . Years of education: 12   Occupational History  . Disability    Social History Main Topics  . Smoking status: Former Research scientist (life sciences)  . Smokeless tobacco: Never Used  . Alcohol use 4.2 oz/week    7 Cans of beer per week  . Drug use: No  . Sexual activity: Not on file   Other Topics Concern  . Not on file   Social History Narrative  . No narrative on file     Review of Systems  Constitutional: Denies fever, chills, fatigue, or significant weight gain/loss. HENT: Head: Denies headache or neck pain Ears: Denies changes in hearing, ringing in ears, earache, drainage Nose: Denies discharge, stuffiness, itching, nosebleed, sinus pain Throat: Denies sore throat, hoarseness, dry mouth, sores, thrush Eyes: Denies loss/changes in vision, pain, redness, blurry/double vision, flashing lights Cardiovascular: Denies chest pain/discomfort, tightness, palpitations, shortness of breath with activity, difficulty lying down, swelling, sudden awakening with shortness of  breath Respiratory: Denies shortness of breath, cough, sputum production, wheezing Gastrointestinal: Denies dysphasia, heartburn, change in appetite, nausea, change in bowel habits, rectal bleeding, constipation, diarrhea, yellow skin or eyes Genitourinary: Denies frequency, urgency, burning/pain, blood in urine, incontinence, change in urinary strength. Musculoskeletal: Denies muscle/joint pain, stiffness, back pain, redness or swelling of joints, trauma Skin: Denies rashes, lumps, itching, dryness, color changes, or hair/nail changes Neurological: Denies dizziness, fainting, seizures, weakness, numbness, tingling, tremor Psychiatric - Denies nervousness, stress, depression or memory loss Endocrine: Denies heat or  cold intolerance, sweating, frequent urination, excessive thirst, changes in appetite Hematologic: Denies ease of bruising or bleeding    Objective:     BP 124/82 (BP Location: Left Arm, Patient Position: Sitting, Cuff Size: Large)   Pulse 88   Temp 97.6 F (36.4 C) (Oral)   Resp 16   Ht 5\' 11"  (1.803 m)   Wt 215 lb (97.5 kg)   SpO2 96%   BMI 29.99 kg/m  Nursing note and vital signs reviewed.   Physical Exam  Constitutional: He is oriented to person, place, and time. He appears well-developed and well-nourished.  HENT:  Head: Normocephalic.  Right Ear: Hearing, tympanic membrane, external ear and ear canal normal.  Left Ear: Hearing, tympanic membrane, external ear and ear canal normal.  Nose: Nose normal.  Mouth/Throat: Uvula is midline, oropharynx is clear and moist and mucous membranes are normal.  Eyes: Conjunctivae and EOM are normal. Pupils are equal, round, and reactive to light.  Neck: Neck supple. No JVD present. No tracheal deviation present. No thyromegaly present.  Cardiovascular: Normal rate, regular rhythm, normal heart sounds and intact distal pulses.   Pulmonary/Chest: Effort normal and breath sounds normal.  Abdominal: Soft. Bowel sounds are normal.  He exhibits no distension and no mass. There is no tenderness. There is no rebound and no guarding.  Musculoskeletal: Normal range of motion. He exhibits no edema or tenderness.  Lymphadenopathy:    He has no cervical adenopathy.  Neurological: He is alert and oriented to person, place, and time. He has normal reflexes. No cranial nerve deficit. He exhibits normal muscle tone. Coordination normal.  Skin: Skin is warm and dry.  Psychiatric: He has a normal mood and affect. His behavior is normal. Judgment and thought content normal.       Assessment & Plan:   During the course of the visit the patient was educated and counseled about appropriate screening and preventive services including:    Pneumococcal vaccine   Influenza vaccine  Hepatitis B vaccine  Prostate cancer screening  Colorectal cancer screening  Nutrition counseling   Diet review for nutrition referral? Yes ____  Not Indicated _X___   Patient Instructions (the written plan) was given to the patient.  Medicare Attestation I have personally reviewed: The patient's medical and social history Their use of alcohol, tobacco or illicit drugs Their current medications and supplements The patient's functional ability including ADLs,fall risks, home safety risks, cognitive, and hearing and visual impairment Diet and physical activities Evidence for depression or mood disorders  The patient's weight, height, BMI,  have been recorded in the chart.  I have made referrals, counseling, and provided education to the patient based on review of the above and I have provided the patient with a written personalized care plan for preventive services.     Problem List Items Addressed This Visit      Respiratory   Chronic bronchitis ([place])    Appears stable with no exacerbation. Continue current dosage of Advair and albuterol. Continue to monitor.        Musculoskeletal and Integument   Osteoarthritis    Continues to  experience pain related to his osteoporosis which makes it difficult to ambulate long distances or stand for long periods of time. Encouraged continued home exercise therapy. [place] [employer] form completed.         Other   Mixed hyperlipidemia    Previous cholesterol noted to be elevated and currently maintained on lifestyle management. Obtain lipid profile with changes in  treatment plan pending results.      Routine general medical examination at a health care facility    1) Anticipatory Guidance: Discussed importance of wearing a seatbelt while driving and not texting while driving; changing batteries in smoke detector at least once annually; wearing suntan lotion when outside; eating a balanced and moderate diet; getting physical activity at least 30 minutes per day.  2) Immunizations / Screenings / Labs:  Declines Zostavax. All other immunizations are up-to-date per recommendations. Obtain PSA for prostate cancer screening. Obtain hepatitis C antibody for hepatitis C screening. Refer to ear nose and throat for hearing screen with decreased hearing noted. Colonoscopies are currently every 5 years with next one in 1 year. All other screenings are up-to-date per recommendations. Obtain CBC, CMET, and lipid profile.    Overall well exam with risk factors for cardiovascular disease including hyperlipidemia Not currently maintained on medication. Encouraged continued weight loss of 5-10% of current body weight through nutrition and physical activity. He spends significant time at the beach. Does continue to struggle with spinal stenosis and movement. Continue other healthy lifestyle behaviors and choices. Follow-up prevention exam in 1 year. Follow-up office visit pending blood work as indicated       Relevant Orders   CBC   Comprehensive metabolic panel   Lipid panel   PSA   Ambulatory referral to ENT   Medicare annual wellness visit, subsequent - Primary    Reviewed and updated patient's  medical, surgical, family and social history. Medications and allergies were also reviewed. Basic screenings for depression, activities of daily living, hearing, cognition and safety were performed. Provider list was updated and health plan was provided to the patient.       Decreased hearing    Decreased hearing noted upon physical exam. Referral placed to ear nose and throat for hearing screen and possible hearing aids if indicated.      Relevant Orders   Ambulatory referral to ENT    Other Visit Diagnoses    Other problems related to lifestyle       Relevant Orders   Hepatitis C antibody (Completed)       I have discontinued Mr. Rozier's cefUROXime. I am also having him maintain his albuterol, Fluticasone-Salmeterol, ibuprofen, and HYDROcodone-acetaminophen.   Follow-up: Return in about 1 year (around 03/29/2017), or if symptoms worsen or fail to improve.   Mauricio Po, FNP

## 2016-03-29 NOTE — Assessment & Plan Note (Signed)
Appears stable with no exacerbation. Continue current dosage of Advair and albuterol. Continue to monitor.  

## 2016-03-29 NOTE — Assessment & Plan Note (Signed)
Decreased hearing noted upon physical exam. Referral placed to ear nose and throat for hearing screen and possible hearing aids if indicated.

## 2016-03-29 NOTE — Assessment & Plan Note (Signed)
Previous cholesterol noted to be elevated and currently maintained on lifestyle management. Obtain lipid profile with changes in treatment plan pending results.

## 2016-03-29 NOTE — Assessment & Plan Note (Signed)
>>  ASSESSMENT AND PLAN FOR DECREASED HEARING WRITTEN ON 03/29/2016 10:14 AM BY CALONE, GREGORY D, FNP  Decreased hearing noted upon physical exam. Referral placed to ear nose and throat for hearing screen and possible hearing aids if indicated.

## 2016-03-29 NOTE — Assessment & Plan Note (Signed)
Reviewed and updated patient's medical, surgical, family and social history. Medications and allergies were also reviewed. Basic screenings for depression, activities of daily living, hearing, cognition and safety were performed. Provider list was updated and health plan was provided to the patient.  

## 2016-03-29 NOTE — Patient Instructions (Addendum)
Thank you for choosing [facility].  SUMMARY AND INSTRUCTIONS:  Medication:  Please continue to take your medications as prescribed.   Labs:  Please stop by the lab on the lower level of the building for your blood work. Your results will be released to [facility] (or called to you) after review, usually within 72 hours after test completion. If any changes need to be made, you will be notified at that same time.  1.) The lab is open from 7:30am to 5:30 pm Monday-Friday 2.) No appointment is necessary 3.) Fasting (if needed) is 6-8 hours after food and drink; black coffee and water are okay    Follow up:  If your symptoms worsen or fail to improve, please contact our office for further instruction, or in case of emergency go directly to the emergency room at the closest medical facility.   Health Maintenance  Topic Date Due  . Hepatitis C Screening  Jan 22, 1951  . HIV Screening  11/08/1966  . ZOSTAVAX  11/08/2011  . COLONOSCOPY  03/16/2017  . TETANUS/TDAP  07/23/2022  . INFLUENZA VACCINE  Addressed   Health Maintenance, Male A healthy lifestyle and preventative care can promote health and wellness.  Maintain regular health, dental, and eye exams.  Eat a healthy diet. Foods like vegetables, fruits, whole grains, low-fat dairy products, and lean protein foods contain the nutrients you need and are low in calories. Decrease your intake of foods high in solid fats, added sugars, and salt. Get information about a proper diet from your health care provider, if necessary.  Regular physical exercise is one of the most important things you can do for your health. Most adults should get at least 150 minutes of moderate-intensity exercise (any activity that increases your heart rate and causes you to sweat) each week. In addition, most adults need muscle-strengthening exercises on 2 or more days a week.   Maintain a healthy weight. The body mass index (BMI) is a screening tool to  identify possible weight problems. It provides an estimate of body fat based on height and weight. Your health care provider can find your BMI and can help you achieve or maintain a healthy weight. For males 20 years and older:  A BMI below 18.5 is considered underweight.  A BMI of 18.5 to 24.9 is normal.  A BMI of 25 to 29.9 is considered overweight.  A BMI of 30 and above is considered obese.  Maintain normal blood lipids and cholesterol by exercising and minimizing your intake of saturated fat. Eat a balanced diet with plenty of fruits and vegetables. Blood tests for lipids and cholesterol should begin at age 56 and be repeated every 5 years. If your lipid or cholesterol levels are high, you are over age 81, or you are at high risk for heart disease, you may need your cholesterol levels checked more frequently.Ongoing high lipid and cholesterol levels should be treated with medicines if diet and exercise are not working.  If you smoke, find out from your health care provider how to quit. If you do not use tobacco, do not start.  Lung cancer screening is recommended for adults aged 48-80 years who are at high risk for developing lung cancer because of a history of smoking. A yearly low-dose CT scan of the lungs is recommended for people who have at least a 30-pack-year history of smoking and are current smokers or have quit within the past 15 years. A pack year of smoking is smoking an average of  1 pack of cigarettes a day for 1 year (for example, a 30-pack-year history of smoking could mean smoking 1 pack a day for 30 years or 2 packs a day for 15 years). Yearly screening should continue until the smoker has stopped smoking for at least 15 years. Yearly screening should be stopped for people who develop a health problem that would prevent them from having lung cancer treatment.  If you choose to drink alcohol, do not have more than 2 drinks per day. One drink is considered to be 12 oz (360 mL) of  beer, 5 oz (150 mL) of wine, or 1.5 oz (45 mL) of liquor.  Avoid the use of street drugs. Do not share needles with anyone. Ask for help if you need support or instructions about stopping the use of drugs.  High blood pressure causes heart disease and increases the risk of stroke. High blood pressure is more likely to develop in:  People who have blood pressure in the end of the normal range (100-139/85-89 mm Hg).  People who are overweight or obese.  People who are African American.  If you are 23-98 years of age, have your blood pressure checked every 3-5 years. If you are 71 years of age or older, have your blood pressure checked every year. You should have your blood pressure measured twice-once when you are at a hospital or clinic, and once when you are not at a hospital or clinic. Record the average of the two measurements. To check your blood pressure when you are not at a hospital or clinic, you can use:  An automated blood pressure machine at a pharmacy.  A home blood pressure monitor.  If you are 79-5 years old, ask your health care provider if you should take aspirin to prevent heart disease.  Diabetes screening involves taking a blood sample to check your fasting blood sugar level. This should be done once every 3 years after age 35 if you are at a normal weight and without risk factors for diabetes. Testing should be considered at a younger age or be carried out more frequently if you are overweight and have at least 1 risk factor for diabetes.  Colorectal cancer can be detected and often prevented. Most routine colorectal cancer screening begins at the age of 71 and continues through age 19. However, your health care provider may recommend screening at an earlier age if you have risk factors for colon cancer. On a yearly basis, your health care provider may provide home test kits to check for hidden blood in the stool. A small camera at the end of a tube may be used to directly  examine the colon (sigmoidoscopy or colonoscopy) to detect the earliest forms of colorectal cancer. Talk to your health care provider about this at age 63 when routine screening begins. A direct exam of the colon should be repeated every 5-10 years through age 14, unless early forms of precancerous polyps or small growths are found.  People who are at an increased risk for hepatitis B should be screened for this virus. You are considered at high risk for hepatitis B if:  You were born in a country where hepatitis B occurs often. Talk with your health care provider about which countries are considered high risk.  Your parents were born in a high-risk country and you have not received a shot to protect against hepatitis B (hepatitis B vaccine).  You have HIV or AIDS.  You use needles to inject  street drugs.  You live with, or have sex with, someone who has hepatitis B.  You are a man who has sex with other men (MSM).  You get hemodialysis treatment.  You take certain medicines for conditions like cancer, organ transplantation, and autoimmune conditions.  Hepatitis C blood testing is recommended for all people born from [birth year] through 1964 and any individual with known risk factors for hepatitis C.  Healthy men should no longer receive prostate-specific antigen (PSA) blood tests as part of routine cancer screening. Talk to your health care provider about prostate cancer screening.  Testicular cancer screening is not recommended for adolescents or adult males who have no symptoms. Screening includes self-exam, a health care provider exam, and other screening tests. Consult with your health care provider about any symptoms you have or any concerns you have about testicular cancer.  Practice safe sex. Use condoms and avoid high-risk sexual practices to reduce the spread of sexually transmitted infections (STIs).  You should be screened for STIs, including gonorrhea and chlamydia if:  You are  sexually active and are younger than 24 years.  You are older than 24 years, and your health care provider tells you that you are at risk for this type of infection.  Your sexual activity has changed since you were last screened, and you are at an increased risk for chlamydia or gonorrhea. Ask your health care provider if you are at risk.  If you are at risk of being infected with HIV, it is recommended that you take a prescription medicine daily to prevent HIV infection. This is called pre-exposure prophylaxis (PrEP). You are considered at risk if:  You are a man who has sex with other men (MSM).  You are a heterosexual man who is sexually active with multiple partners.  You take drugs by injection.  You are sexually active with a partner who has HIV.  Talk with your health care provider about whether you are at high risk of being infected with HIV. If you choose to begin PrEP, you should first be tested for HIV. You should then be tested every 3 months for as long as you are taking PrEP.  Use sunscreen. Apply sunscreen liberally and repeatedly throughout the day. You should seek shade when your shadow is shorter than you. Protect yourself by wearing long sleeves, pants, a wide-brimmed hat, and sunglasses year round whenever you are outdoors.  Tell your health care provider of new moles or changes in moles, especially if there is a change in shape or color. Also, tell your health care provider if a mole is larger than the size of a pencil eraser.  A one-time screening for abdominal aortic aneurysm (AAA) and surgical repair of large AAAs by ultrasound is recommended for men aged 44-75 years who are current or former smokers.  Stay current with your vaccines (immunizations). This information is not intended to replace advice given to you by your health care provider. Make sure you discuss any questions you have with your health care provider. Document Released: 09/29/2007 Document Revised:  04/23/2014 Document Reviewed: 01/04/2015 Elsevier Interactive Patient Education  2017 [employer].

## 2016-03-29 NOTE — Assessment & Plan Note (Signed)
1) Anticipatory Guidance: Discussed importance of wearing a seatbelt while driving and not texting while driving; changing batteries in smoke detector at least once annually; wearing suntan lotion when outside; eating a balanced and moderate diet; getting physical activity at least 30 minutes per day.  2) Immunizations / Screenings / Labs:  Declines Zostavax. All other immunizations are up-to-date per recommendations. Obtain PSA for prostate cancer screening. Obtain hepatitis C antibody for hepatitis C screening. Refer to ear nose and throat for hearing screen with decreased hearing noted. Colonoscopies are currently every 5 years with next one in 1 year. All other screenings are up-to-date per recommendations. Obtain CBC, CMET, and lipid profile.    Overall well exam with risk factors for cardiovascular disease including hyperlipidemia Not currently maintained on medication. Encouraged continued weight loss of 5-10% of current body weight through nutrition and physical activity. He [place] significant time at the beach. Does continue to struggle with spinal stenosis and movement. Continue other healthy lifestyle behaviors and choices. Follow-up prevention exam in 1 year. Follow-up office visit pending blood work as indicated

## 2016-03-29 NOTE — Assessment & Plan Note (Signed)
Continues to experience pain related to his osteoporosis which makes it difficult to ambulate long distances or stand for long periods of time. Encouraged continued home exercise therapy. [place] [employer] form completed.

## 2016-03-30 LAB — CBC
HCT: 47.3 % (ref 39.0–52.0)
Hemoglobin: 16.4 g/dL (ref 13.0–17.0)
MCHC: 34.7 g/dL (ref 30.0–36.0)
MCV: 87.7 fl (ref 78.0–100.0)
PLATELETS: 300 10*3/uL (ref 150.0–400.0)
RBC: 5.4 Mil/uL (ref 4.22–5.81)
RDW: 13.6 % (ref 11.5–15.5)
WBC: 10.4 10*3/uL (ref 4.0–10.5)

## 2016-04-01 ENCOUNTER — Encounter: Payer: Self-pay | Admitting: Family

## 2016-05-08 DIAGNOSIS — H903 Sensorineural hearing loss, bilateral: Secondary | ICD-10-CM | POA: Diagnosis not present

## 2016-05-08 DIAGNOSIS — H9313 Tinnitus, bilateral: Secondary | ICD-10-CM | POA: Diagnosis not present

## 2016-05-08 DIAGNOSIS — Z77122 Contact with and (suspected) exposure to noise: Secondary | ICD-10-CM | POA: Diagnosis not present

## 2016-05-09 DIAGNOSIS — L570 Actinic keratosis: Secondary | ICD-10-CM | POA: Diagnosis not present

## 2016-05-09 DIAGNOSIS — Z23 Encounter for immunization: Secondary | ICD-10-CM | POA: Diagnosis not present

## 2016-05-09 DIAGNOSIS — H9313 Tinnitus, bilateral: Secondary | ICD-10-CM | POA: Insufficient documentation

## 2016-05-09 DIAGNOSIS — L723 Sebaceous cyst: Secondary | ICD-10-CM | POA: Diagnosis not present

## 2016-05-09 DIAGNOSIS — H903 Sensorineural hearing loss, bilateral: Secondary | ICD-10-CM | POA: Insufficient documentation

## 2016-05-16 ENCOUNTER — Ambulatory Visit (INDEPENDENT_AMBULATORY_CARE_PROVIDER_SITE_OTHER): Payer: Medicare HMO | Admitting: Adult Health

## 2016-05-16 ENCOUNTER — Encounter: Payer: Self-pay | Admitting: Adult Health

## 2016-05-16 VITALS — BP 121/78 | HR 78 | Ht 71.0 in | Wt 216.7 lb

## 2016-05-16 DIAGNOSIS — R05 Cough: Secondary | ICD-10-CM

## 2016-05-16 DIAGNOSIS — H9193 Unspecified hearing loss, bilateral: Secondary | ICD-10-CM

## 2016-05-16 DIAGNOSIS — E7800 Pure hypercholesterolemia, unspecified: Secondary | ICD-10-CM

## 2016-05-16 DIAGNOSIS — Z86010 Personal history of colonic polyps: Secondary | ICD-10-CM

## 2016-05-16 DIAGNOSIS — R739 Hyperglycemia, unspecified: Secondary | ICD-10-CM | POA: Diagnosis not present

## 2016-05-16 DIAGNOSIS — K219 Gastro-esophageal reflux disease without esophagitis: Secondary | ICD-10-CM | POA: Diagnosis not present

## 2016-05-16 DIAGNOSIS — R053 Chronic cough: Secondary | ICD-10-CM

## 2016-05-16 LAB — POCT GLYCOSYLATED HEMOGLOBIN (HGB A1C): Hemoglobin A1C: 5.5

## 2016-05-16 MED ORDER — AZITHROMYCIN 250 MG PO TABS: ORAL_TABLET | ORAL | 0 refills | Status: DC

## 2016-05-16 NOTE — Progress Notes (Signed)
Subjective:    Patient ID: Brandon Schultz, male    DOB: 1951-01-25, 66 y.o.   MRN: [id number]  HPI:  Brandon Schultz presents to establish as a new pt.  He had labs drawn last month, and recently visited Optometrist (bil blepharoplasty) and Audiologist (bil hearing aids recommended).  Hx of syncopal episode 2014, had full cardiac work-up that was all negative, no f/u with Cardiologist.  Chronic neck/back/bil knee pain r/t to "hard work my entire life".  Pain managed by rest and OTC NSAIDs.  Hx of colon polyps, denies current GI sx's-needs colonoscopy this year.   Hx of tobacco use, now reports "social smoking", estimates "1 cigarette [place]".  He has had a productive cough for the last 3 weeks.  Reports mild dyspnea when coughing and denies recent tobacco use.   Patient Care Team    Relationship Specialty Notifications Start End  Brandon Aquas, NP PCP - General Family Medicine  05/16/16   Brandon Dopp, MD Referring Physician Dermatology  05/16/16     Patient Active Problem List   Diagnosis Date Noted  . Persistent cough 05/16/2016  . Elevated blood sugar 05/16/2016  . Elevated cholesterol 05/16/2016  . Decreased hearing 03/29/2016  . Routine general medical examination at a health care facility 08/18/2014  . Medicare annual wellness visit, subsequent 08/18/2014  . Sore throat 07/15/2014  . Abdominal pain, unspecified site 02/20/2013  . Encounter to establish care with new doctor 02/20/2013  . Hemorrhoids 07/08/2012  . Mixed hyperlipidemia 07/28/2008  . DEPRESSION 07/28/2008  . Chronic bronchitis ([place]) 07/28/2008  . GERD 07/28/2008  . Osteoarthritis 07/28/2008  . SYNCOPE AND COLLAPSE 07/28/2008     Past Medical History:  Diagnosis Date  . Arthritis   . Asthma   . Colon polyps   . Colon polyps   . COPD (chronic obstructive pulmonary disease) ([place])   . Diverticulitis   . Emphysema   . GERD (gastroesophageal reflux disease)   . Gum disease    . H/O degenerative disc disease   . Hemorrhoids   . Inguinal hernia   . Melanoma ([place])    facial  . Renal disease    Bright's Disease-childhood  . Syncope   . Umbilical hernia   . Urinary tract infection      Past Surgical History:  Procedure Laterality Date  . HERNIA REPAIR     Umbilical & Inguinal  . left knee surgery    . MELANOMA EXCISION     facial  . neck and shoulder injection    . WISDOM TOOTH EXTRACTION       Family History  Problem Relation Age of Onset  . Colon cancer Mother   . Heart attack Father     Deceased-Early 47s  . Cancer Maternal Grandfather   . Dementia Maternal Grandmother   . Syncope episode Sister   . Healthy Sister     x1  . Healthy Daughter     x2  . Healthy Son     x2     History  Drug Use No     History  Alcohol Use  . 4.2 oz/week  . 7 Cans of beer per week     History  Smoking Status  . Former Smoker  . Packs/day: 2.00  . Years: 40.00  . Types: Cigarettes  . Quit date: 04/16/2014  Smokeless Tobacco  . Never Used     Outpatient Encounter Prescriptions as of 05/16/2016  Medication Sig  . albuterol (PROVENTIL HFA;VENTOLIN HFA) 108 (90 BASE) MCG/ACT inhaler Inhale 2 puffs into the lungs every 6 (six) hours as needed for wheezing or shortness of breath.  Marland Kitchen ibuprofen (ADVIL,MOTRIN) 600 MG tablet Take 1 tablet (600 mg total) by mouth every 6 (six) hours as needed.  Marland Kitchen azithromycin (ZITHROMAX) 250 MG tablet 2 tablets by mouth day 1.Then 1 tablet by mouth the next 4 days.  . [DISCONTINUED] Fluticasone-Salmeterol (ADVAIR DISKUS) 250-50 MCG/DOSE AEPB Inhale 1 puff into the lungs 2 (two) times daily.  . [DISCONTINUED] HYDROcodone-acetaminophen (NORCO/VICODIN) 5-325 MG tablet Take 1 tablet by mouth every 6 (six) hours as needed.   No facility-administered encounter medications on file as of 05/16/2016.     Allergies: Patient has no known allergies.  Body mass index is 30.22 kg/m.  Blood pressure 121/78, pulse 78, height  5\' 11"  (1.803 m), weight 216 lb 11.2 oz (98.3 kg).     Review of Systems  Constitutional: Negative for activity change, appetite change, chills, diaphoresis, fatigue, fever and unexpected weight change.  HENT: Positive for congestion, hearing loss and postnasal drip. Negative for sore throat, trouble swallowing and voice change.   Eyes: Negative for visual disturbance.  Respiratory: Positive for cough and shortness of breath. Negative for wheezing and stridor.        Productive cough > 3 weeks.  Cardiovascular: Negative for chest pain and leg swelling.  Gastrointestinal: Negative for abdominal distention, nausea and vomiting.  Endocrine: Negative for cold intolerance, heat intolerance, polydipsia, polyphagia and polyuria.  Genitourinary: Negative for difficulty urinating.  Musculoskeletal: Positive for arthralgias, back pain, joint swelling, myalgias, neck pain and neck stiffness. Negative for gait problem.  Skin: Negative for color change, pallor, rash and wound.  Neurological: Negative for dizziness, speech difficulty, weakness, light-headedness and headaches.  Psychiatric/Behavioral: Negative for agitation and behavioral problems. The patient is not nervous/anxious.        Objective:   Physical Exam  Constitutional: He is oriented to person, place, and time. He appears well-developed and well-nourished. He appears distressed.  HENT:  Head: Normocephalic and atraumatic.  Right Ear: External ear normal.  Left Ear: External ear normal.  Eyes: Conjunctivae and EOM are normal. Pupils are equal, round, and reactive to light.  Neck: Normal range of motion. Neck supple. Tracheal deviation present.  Tenderness over posterior cervical region.  Cardiovascular: Normal rate, regular rhythm, normal heart sounds and intact distal pulses.   No murmur heard. Pulmonary/Chest: Effort normal and breath sounds normal. No respiratory distress. He has no wheezes. He exhibits no tenderness.  Strong  cough noted throughout entire encounter.   Abdominal: Soft. Bowel sounds are normal.  Musculoskeletal: He exhibits no tenderness.  Generalized tenderness noted over: Cervical/Thoracic/Lumbar back. Bil knees.  Lymphadenopathy:    He has no cervical adenopathy.  Neurological: He is alert and oriented to person, place, and time. He has normal reflexes.  Skin: Skin is warm and dry. No rash noted. He is not diaphoretic. No erythema. No pallor.  Psychiatric: He has a normal mood and affect. His behavior is normal. Judgment and thought content normal.  Nursing note and vitals reviewed.  Jaeleah Smyser d. Bess, NP-C      Assessment & Plan:   1. Gastroesophageal reflux disease without esophagitis   2. Persistent cough   3. Decreased hearing of both ears   4. Elevated blood sugar   5. Elevated cholesterol   6. Hx of colonic polyps     GERD Reduce acidic food intake.  Do not eat 2 hrs prior to bedtime.   Elevated cholesterol Reduced saturated fat intake.  Healthy eating information provided.  Decreased hearing Continue with Audiologist as directed.   Elevated blood sugar Reduce sugar and CHO intake.  Reviewed A1c and glucose on 12/17 lab draw.  Information on healthy eating and regular exercise provided. Will re-check labs 07/2016.    Persistent cough Azithromycin sent in. He has been coughing for > 3 weeks, productive in am - thick yellow/green. Increase fluids.  Avoid tobacco use.    FOLLOW-UP:  Return in about 2 months (around 07/25/2016) for Fasting Lab Draw.

## 2016-05-16 NOTE — Assessment & Plan Note (Signed)
Reduce sugar and CHO intake.  Reviewed A1c and glucose on 12/17 lab draw.  Information on healthy eating and regular exercise provided. Will re-check labs 07/2016.

## 2016-05-16 NOTE — Assessment & Plan Note (Signed)
Azithromycin sent in. He has been coughing for > 3 weeks, productive in am - thick yellow/green. Increase fluids.  Avoid tobacco use.

## 2016-05-16 NOTE — Assessment & Plan Note (Signed)
>>  ASSESSMENT AND PLAN FOR ELEVATED CHOLESTEROL WRITTEN ON 05/16/2016 10:22 AM BY BESS, KATY D, NP  Reduced saturated fat intake.  Healthy eating information provided.

## 2016-05-16 NOTE — Patient Instructions (Addendum)
Acute Bronchitis, Adult Acute bronchitis is when air tubes (bronchi) in the lungs suddenly get swollen. The condition can make it hard to breathe. It can also cause these symptoms:  A cough.  Coughing up clear, yellow, or green mucus.  Wheezing.  Chest congestion.  Shortness of breath.  A fever.  Body aches.  Chills.  A sore throat. Follow these instructions at home: Medicines  Take over-the-counter and prescription medicines only as told by your doctor.  If you were prescribed an antibiotic medicine, take it as told by your doctor. Do not stop taking the antibiotic even if you start to feel better. General instructions  Rest.  Drink enough fluids to keep your pee (urine) clear or pale yellow.  Avoid smoking and secondhand smoke. If you smoke and you need help quitting, ask your doctor. Quitting will help your lungs heal faster.  Use an inhaler, cool mist vaporizer, or humidifier as told by your doctor.  Keep all follow-up visits as told by your doctor. This is important. How is this prevented? To lower your risk of getting this condition again:  Wash your hands often with soap and water. If you cannot use soap and water, use hand sanitizer.  Avoid contact with people who have cold symptoms.  Try not to touch your hands to your mouth, nose, or eyes.  Make sure to get the flu shot every year. Contact a doctor if:  Your symptoms do not get better in 2 weeks. Get help right away if:  You cough up blood.  You have chest pain.  You have very bad shortness of breath.  You become dehydrated.  You faint (pass out) or keep feeling like you are going to pass out.  You keep throwing up (vomiting).  You have a very bad headache.  Your fever or chills gets worse. This information is not intended to replace advice given to you by your health care provider. Make sure you discuss any questions you have with your health care provider. Document Released: 09/19/2007  Document Revised: 11/09/2015 Document Reviewed: 09/21/2015 Elsevier Interactive Patient Education  2017 [place] and Cholesterol Restricted Diet Introduction Getting too much fat and cholesterol in your diet may cause health problems. Following this diet helps keep your fat and cholesterol at normal levels. This can keep you from getting sick. What types of fat should I choose?  Choose monosaturated and polyunsaturated fats. These are found in foods such as olive oil, canola oil, flaxseeds, walnuts, almonds, and seeds.  Eat more omega-3 fats. Good choices include salmon, mackerel, sardines, tuna, flaxseed oil, and ground flaxseeds.  Limit saturated fats. These are in animal products such as meats, butter, and cream. They can also be in plant products such as palm oil, palm kernel oil, and coconut oil.  Avoid foods with partially hydrogenated oils in them. These contain trans fats. Examples of foods that have trans fats are stick margarine, some tub margarines, cookies, crackers, and other baked goods. What general guidelines do I need to follow?  Check food labels. Look for the words "trans fat" and "saturated fat."  When preparing a meal:  Fill half of your plate with vegetables and green salads.  Fill one fourth of your plate with whole grains. Look for the word "whole" as the first word in the ingredient list.  Fill one fourth of your plate with lean protein foods.  Eat more foods that have fiber, like apples, carrots, beans, peas, and barley.  Eat  more home-cooked foods. Eat less at restaurants and buffets.  Limit or avoid alcohol.  Limit foods high in starch and sugar.  Limit fried foods.  Cook foods without frying them. Baking, boiling, grilling, and broiling are all great options.  Lose weight if you are overweight. Losing even a small amount of weight can help your overall health. It can also help prevent diseases such as diabetes and heart disease. What  foods can I eat? Grains  Whole grains, such as whole wheat or whole grain breads, crackers, cereals, and pasta. Unsweetened oatmeal, bulgur, barley, quinoa, or brown rice. Corn or whole wheat flour tortillas. Vegetables  Fresh or frozen vegetables (raw, steamed, roasted, or grilled). Green salads. Fruits  All fresh, canned (in natural juice), or frozen fruits. Meat and Other Protein Products  Ground beef (85% or leaner), grass-fed beef, or beef trimmed of fat. Skinless chicken or [place]. Ground chicken or [place]. Pork trimmed of fat. All fish and seafood. Eggs. Dried beans, peas, or lentils. Unsalted nuts or seeds. Unsalted canned or dry beans. Dairy  Low-fat dairy products, such as skim or 1% milk, 2% or reduced-fat cheeses, low-fat ricotta or cottage cheese, or plain low-fat yogurt. Fats and Oils  Tub margarines without trans fats. Light or reduced-fat mayonnaise and salad dressings. Avocado. Olive, canola, sesame, or safflower oils. Natural peanut or almond butter (choose ones without added sugar and oil). The items listed above may not be a complete list of recommended foods or beverages. Contact your dietitian for more options.  What foods are not recommended? Grains  White bread. White pasta. White rice. Cornbread. Bagels, pastries, and croissants. Crackers that contain trans fat. Vegetables  White potatoes. Corn. Creamed or fried vegetables. Vegetables in a cheese sauce. Fruits  Dried fruits. Canned fruit in light or heavy syrup. Fruit juice. Meat and Other Protein Products  Fatty cuts of meat. Ribs, chicken wings, bacon, sausage, bologna, salami, chitterlings, fatback, hot dogs, bratwurst, and packaged luncheon meats. Liver and organ meats. Dairy  Whole or 2% milk, cream, half-and-half, and cream cheese. Whole milk cheeses. Whole-fat or sweetened yogurt. Full-fat cheeses. Nondairy creamers and whipped toppings. Processed cheese, cheese spreads, or cheese curds. Sweets and Desserts    Corn syrup, sugars, honey, and molasses. Candy. Jam and jelly. Syrup. Sweetened cereals. Cookies, pies, cakes, donuts, muffins, and ice cream. Fats and Oils  Butter, stick margarine, lard, shortening, ghee, or bacon fat. Coconut, palm kernel, or palm oils. Beverages  Alcohol. Sweetened drinks (such as sodas, lemonade, and fruit drinks or punches). The items listed above may not be a complete list of foods and beverages to avoid. Contact your dietitian for more information.  This information is not intended to replace advice given to you by your health care provider. Make sure you discuss any questions you have with your health care provider. Document Released: 10/02/2011 Document Revised: 12/08/2015 Document Reviewed: 07/02/2013  2017 Elsevier   Exercising to Stay Healthy Introduction Exercising regularly is important. It has many health benefits, such as:  Improving your overall fitness, flexibility, and endurance.  Increasing your bone density.  Helping with weight control.  Decreasing your body fat.  Increasing your muscle strength.  Reducing stress and tension.  Improving your overall health. In order to become healthy and stay healthy, it is recommended that you do moderate-intensity and vigorous-intensity exercise. You can tell that you are exercising at a moderate intensity if you have a higher heart rate and faster breathing, but you are still able to hold  a conversation. You can tell that you are exercising at a vigorous intensity if you are breathing much harder and faster and cannot hold a conversation while exercising. How often should I exercise? Choose an activity that you enjoy and set realistic goals. Your health care provider can help you to make an activity plan that works for you. Exercise regularly as directed by your health care provider. This may include:  Doing resistance training twice each week, such as:  Push-ups.  Sit-ups.  Lifting weights.  Using  resistance bands.  Doing a given intensity of exercise for a given amount of time. Choose from these options:  150 minutes of moderate-intensity exercise every week.  75 minutes of vigorous-intensity exercise every week.  A mix of moderate-intensity and vigorous-intensity exercise every week. Children, pregnant women, people who are out of shape, people who are overweight, and older adults may need to consult a health care provider for individual recommendations. If you have any sort of medical condition, be sure to consult your health care provider before starting a new exercise program. What are some exercise ideas? Some moderate-intensity exercise ideas include:  Walking at a rate of 1 mile in 15 minutes.  Biking.  Hiking.  Golfing.  Dancing. Some vigorous-intensity exercise ideas include:  Walking at a rate of at least 4.5 miles per hour.  Jogging or running at a rate of 5 miles per hour.  Biking at a rate of at least 10 miles per hour.  Lap swimming.  Roller-skating or in-line skating.  Cross-country skiing.  Vigorous competitive sports, such as football, basketball, and soccer.  Jumping rope.  Aerobic dancing. What are some everyday activities that can help me to get exercise?  Yard work, such as:  Psychologist, educational.  Raking and bagging leaves.  Washing and waxing your car.  Pushing a stroller.  Shoveling snow.  Gardening.  Washing windows or floors. How can I be more active in my day-to-day activities?  Use the stairs instead of the elevator.  Take a walk during your lunch break.  If you drive, park your car farther away from work or school.  If you take public transportation, get off one stop early and walk the rest of the way.  Make all of your phone calls while standing up and walking around.  Get up, stretch, and walk around every 30 minutes throughout the day. What guidelines should I follow while exercising?  Do not exercise so  much that you hurt yourself, feel dizzy, or get very short of breath.  Consult your health care provider before starting a new exercise program.  Wear comfortable clothes and shoes with good support.  Drink plenty of water while you exercise to prevent dehydration or heat stroke. Body water is lost during exercise and must be replaced.  Work out until you breathe faster and your heart beats faster. This information is not intended to replace advice given to you by your health care provider. Make sure you discuss any questions you have with your health care provider. Document Released: 05/05/2010 Document Revised: 09/08/2015 Document Reviewed: 09/03/2013  2017 Elsevier   Take antibiotic as directed.  Increase fluids/vit c.  No tobacco use. Discussed elevated blood sugar and cholesterol.  Provided information on healthy eating and regular exercise.  Encouraged to improve cholesterol/blood sugar via healthy lifestyle vs. Medication. Will re-check lipids/glucose in 6 months, if not improved will discuss medications at that time.  GI referral placed for colonoscopy. Please return April 2018  for fasting labs, otherwise annual physicals.

## 2016-05-16 NOTE — Assessment & Plan Note (Signed)
Reduced saturated fat intake.  Healthy eating information provided.

## 2016-05-16 NOTE — Assessment & Plan Note (Signed)
Reduce acidic food intake.  Do not eat 2 hrs prior to bedtime.

## 2016-05-16 NOTE — Assessment & Plan Note (Signed)
Continue with Audiologist as directed.

## 2016-05-16 NOTE — Assessment & Plan Note (Signed)
>>  ASSESSMENT AND PLAN FOR DECREASED HEARING WRITTEN ON 05/16/2016 10:23 AM BY BESS, KATY D, NP  Continue with Audiologist as directed.

## 2016-05-18 ENCOUNTER — Telehealth: Payer: Self-pay | Admitting: Adult Health

## 2016-05-18 NOTE — Telephone Encounter (Signed)
Pt's wife called states he need has misplaced the last 4 pills of the  (Azithromycin 250 MG )   Pt request Katy call into pharmacy [facility]) a new Rx. --glh

## 2016-05-21 ENCOUNTER — Telehealth: Payer: Self-pay | Admitting: Adult Health

## 2016-05-21 MED ORDER — AZITHROMYCIN 250 MG PO TABS: ORAL_TABLET | ORAL | 0 refills | Status: DC

## 2016-05-21 NOTE — Telephone Encounter (Signed)
Rx refilled.  If not better by end of week, needs an appt.

## 2016-05-21 NOTE — Telephone Encounter (Signed)
Pt's wife called states [place] has lost the remaining Azithromycin tablets 250 MG ---ask that we called into pharmacy another Rx please. glh

## 2016-05-21 NOTE — Addendum Note (Signed)
Addended by: Beatrice Lecher D on: 05/21/2016 12:23 PM   Modules accepted: Orders

## 2016-05-21 NOTE — Telephone Encounter (Signed)
Please advise if this is acceptable.  Pt's spouse called on 05/18/16 but the message was incorrectly routed to [facility] rather than [facility].  Thank you!

## 2016-05-22 ENCOUNTER — Telehealth: Payer: Self-pay | Admitting: Gastroenterology

## 2016-05-22 NOTE — Telephone Encounter (Signed)
Received GI records from [facility] and placed on Dr. Woodward Ku desk for review. Dr. Silverio Decamp is Doc of the Day.

## 2016-05-24 ENCOUNTER — Encounter: Payer: Self-pay | Admitting: Gastroenterology

## 2016-06-28 DIAGNOSIS — K635 Polyp of colon: Secondary | ICD-10-CM | POA: Insufficient documentation

## 2016-07-06 ENCOUNTER — Ambulatory Visit (AMBULATORY_SURGERY_CENTER): Payer: Self-pay | Admitting: *Deleted

## 2016-07-06 VITALS — Ht 71.0 in | Wt 214.0 lb

## 2016-07-06 DIAGNOSIS — Z86010 Personal history of colonic polyps: Secondary | ICD-10-CM

## 2016-07-06 MED ORDER — NA SULFATE-K SULFATE-MG SULF 17.5-3.13-1.6 GM/177ML PO SOLN: 1.0000 | Freq: Once | ORAL | 0 refills | Status: AC

## 2016-07-06 NOTE — Progress Notes (Signed)
No egg or soy allergy known to patient  No issues with past sedation with any surgeries  or procedures, no intubation problems  No diet pills per patient No home 02 use per patient  No blood thinners per patient  Pt denies issues with constipation  No A fib or A flutter   

## 2016-07-16 ENCOUNTER — Encounter: Payer: Self-pay | Admitting: Gastroenterology

## 2016-07-20 ENCOUNTER — Telehealth: Payer: Self-pay | Admitting: Gastroenterology

## 2016-07-27 ENCOUNTER — Encounter: Payer: Self-pay | Admitting: Gastroenterology

## 2016-07-27 ENCOUNTER — Ambulatory Visit (AMBULATORY_SURGERY_CENTER): Payer: Medicare HMO | Admitting: Gastroenterology

## 2016-07-27 VITALS — BP 123/65 | HR 59 | Temp 98.0°F | Resp 14 | Ht 71.0 in | Wt 214.0 lb

## 2016-07-27 DIAGNOSIS — Z800 Family history of malignant neoplasm of digestive organs: Secondary | ICD-10-CM | POA: Diagnosis not present

## 2016-07-27 DIAGNOSIS — Z86010 Personal history of colonic polyps: Secondary | ICD-10-CM

## 2016-07-27 DIAGNOSIS — D122 Benign neoplasm of ascending colon: Secondary | ICD-10-CM

## 2016-07-27 DIAGNOSIS — E669 Obesity, unspecified: Secondary | ICD-10-CM | POA: Diagnosis not present

## 2016-07-27 DIAGNOSIS — D126 Benign neoplasm of colon, unspecified: Secondary | ICD-10-CM | POA: Diagnosis not present

## 2016-07-27 MED ORDER — SODIUM CHLORIDE 0.9 % IV SOLN: 500.0000 mL | INTRAVENOUS | Status: DC

## 2016-07-27 NOTE — Progress Notes (Signed)
Report to PACU, RN, vss, BBS= Clear.  

## 2016-07-27 NOTE — Op Note (Signed)
[facility] Patient Name: Brandon Schultz Procedure Date: 07/27/2016 9:51 AM MRN: [id number] Endoscopist: Mauri Pole , MD Age: 66 Referring MD:  Date of Birth: 11-14-1950 Gender: Male Account #: [id number] Procedure:                Colonoscopy Indications:              Screening patient at increased risk: Family history                            of 1st-degree relative with colorectal cancer at                            age 70 years (or older), High risk colon cancer                            surveillance: Personal history of colonic polyps,                            Last colonoscopy 5 years ago Medicines:                Monitored Anesthesia Care Procedure:                Pre-Anesthesia Assessment:                           - Prior to the procedure, a History and Physical                            was performed, and patient medications and                            allergies were reviewed. The patient's tolerance of                            previous anesthesia was also reviewed. The risks                            and benefits of the procedure and the sedation                            options and risks were discussed with the patient.                            All questions were answered, and informed consent                            was obtained. Prior Anticoagulants: The patient has                            taken no previous anticoagulant or antiplatelet                            agents. ASA Grade Assessment: II - A patient with  mild systemic disease. After reviewing the risks                            and benefits, the patient was deemed in                            satisfactory condition to undergo the procedure.                           After obtaining informed consent, the colonoscope                            was passed under direct vision. Throughout the                            procedure, the patient's blood  pressure, pulse, and                            oxygen saturations were monitored continuously. The                            Colonoscope was introduced through the anus and                            advanced to the the cecum, identified by                            appendiceal orifice and ileocecal valve. The                            colonoscopy was performed without difficulty. The                            patient tolerated the procedure well. The quality                            of the bowel preparation was good. The ileocecal                            valve, appendiceal orifice, and rectum were                            photographed. Scope In: 10:01:21 AM Scope Out: 10:17:16 AM Scope Withdrawal Time: 0 hours 12 minutes 34 seconds  Total Procedure Duration: 0 hours 15 minutes 55 seconds  Findings:                 The perianal and digital rectal examinations were                            normal.                           A 5 mm polyp was found in the ascending colon. The  polyp was sessile. The polyp was removed with a                            cold snare. Resection and retrieval were complete.                           Scattered small and large-mouthed diverticula were                            found in the sigmoid colon, descending colon,                            transverse colon and ascending colon. L colon >>R                            colon. There was narrowing of the colon in                            association with the diverticular opening. There                            was evidence of diverticular spasm.                            Peri-diverticular erythema was seen. There was                            evidence of an impacted diverticulum. There was no                            evidence of diverticular bleeding.                           Non-bleeding internal hemorrhoids were found during                             retroflexion. The hemorrhoids were medium-sized. Complications:            No immediate complications. Estimated Blood Loss:     Estimated blood loss was minimal. Impression:               - One 5 mm polyp in the ascending colon, removed                            with a cold snare. Resected and retrieved.                           - Moderate diverticulosis in the sigmoid colon, in                            the descending colon, in the transverse colon and                            in the ascending colon. There was narrowing of the  colon in association with the diverticular opening.                            There was evidence of diverticular spasm.                            Peri-diverticular erythema was seen. There was                            evidence of an impacted diverticulum. There was no                            evidence of diverticular bleeding.                           - Non-bleeding internal hemorrhoids. Recommendation:           - Patient has a contact number available for                            emergencies. The signs and symptoms of potential                            delayed complications were discussed with the                            patient. Return to normal activities tomorrow.                            Written discharge instructions were provided to the                            patient.                           - Resume previous diet.                           - Continue present medications.                           - Await pathology results.                           - Repeat colonoscopy in 5 years for surveillance                            based on pathology results.                           - Return to GI clinic PRN. Mauri Pole, MD 07/27/2016 10:28:24 AM This report has been signed electronically.

## 2016-07-27 NOTE — Progress Notes (Signed)
Called to room to assist during endoscopic procedure.  Patient ID and intended procedure confirmed with present staff. Received instructions for my participation in the procedure from the performing physician.  

## 2016-07-27 NOTE — Patient Instructions (Signed)
YOU HAD AN ENDOSCOPIC PROCEDURE TODAY AT [facility]:   Refer to the procedure report that was given to you for any specific questions about what was found during the examination.  If the procedure report does not answer your questions, please call your gastroenterologist to clarify.  If you requested that your care partner not be given the details of your procedure findings, then the procedure report has been included in a sealed envelope for you to review at your convenience later.  YOU SHOULD EXPECT: Some feelings of bloating in the abdomen. Passage of more gas than usual.  Walking can help get rid of the air that was put into your GI tract during the procedure and reduce the bloating. If you had a lower endoscopy (such as a colonoscopy or flexible sigmoidoscopy) you may notice spotting of blood in your stool or on the toilet paper. If you underwent a bowel prep for your procedure, you may not have a normal bowel movement for a few days.  Please Note:  You might notice some irritation and congestion in your nose or some drainage.  This is from the oxygen used during your procedure.  There is no need for concern and it should clear up in a day or so.  SYMPTOMS TO REPORT IMMEDIATELY:   Following lower endoscopy (colonoscopy or flexible sigmoidoscopy):  Excessive amounts of blood in the stool  Significant tenderness or worsening of abdominal pains  Swelling of the abdomen that is new, acute  Fever of 100F or higher   For urgent or emergent issues, a gastroenterologist can be reached at any hour by calling [phone].   DIET:  We do recommend a small meal at first, but then you may proceed to your regular diet.  Drink plenty of fluids but you should avoid alcoholic beverages for 24 hours.  ACTIVITY:  You should plan to take it easy for the rest of today and you should NOT DRIVE or use heavy machinery until tomorrow (because of the sedation medicines used during the test).     FOLLOW UP: Our staff will call the number listed on your records the next business day following your procedure to check on you and address any questions or concerns that you may have regarding the information given to you following your procedure. If we do not reach you, we will leave a message.  However, if you are feeling well and you are not experiencing any problems, there is no need to return our call.  We will assume that you have returned to your regular daily activities without incident.  If any biopsies were taken you will be contacted by phone or by letter within the next 1-3 weeks.  Please call us at [phone] if you have not heard about the biopsies in 3 weeks.   Await for biopsy results to determined next repeat Colonoscopy screening Polyps (handout given) Diverticulosis (handout given) Hemorrhoids (handout given) High Fiber Diet (handout given)   SIGNATURES/CONFIDENTIALITY: You and/or your care partner have signed paperwork which will be entered into your electronic medical record.  These signatures attest to the fact that that the information above on your After Visit Summary has been reviewed and is understood.  Full responsibility of the confidentiality of this discharge information lies with you and/or your care-partner.

## 2016-07-30 ENCOUNTER — Telehealth: Payer: Self-pay | Admitting: *Deleted

## 2016-07-30 NOTE — Telephone Encounter (Signed)
  Follow up Call-  Call back number 07/27/2016  Post procedure Call Back phone  # [phone]  Permission to leave phone message Yes  Some recent data might be hidden     Patient questions:  Do you have a fever, pain , or abdominal swelling? No. Pain Score  0 *  Have you tolerated food without any problems? Yes.    Have you been able to return to your normal activities? Yes.    Do you have any questions about your discharge instructions: Diet   No. Medications  No. Follow up visit  No.  Do you have questions or concerns about your Care? No.  Actions: * If pain score is 4 or above: No action needed, pain <4.

## 2016-07-30 NOTE — Telephone Encounter (Signed)
  Follow up Call-  Call back number 07/27/2016  Post procedure Call Back phone  # [phone]  Permission to leave phone message Yes  Some recent data might be hidden     No answer, left message!

## 2016-08-01 DIAGNOSIS — M62830 Muscle spasm of back: Secondary | ICD-10-CM | POA: Diagnosis not present

## 2016-08-01 DIAGNOSIS — S39012A Strain of muscle, fascia and tendon of lower back, initial encounter: Secondary | ICD-10-CM | POA: Diagnosis not present

## 2016-08-06 ENCOUNTER — Encounter: Payer: Self-pay | Admitting: Gastroenterology

## 2016-08-06 ENCOUNTER — Ambulatory Visit: Payer: Medicare HMO | Admitting: Adult Health

## 2016-10-11 DIAGNOSIS — H401121 Primary open-angle glaucoma, left eye, mild stage: Secondary | ICD-10-CM | POA: Diagnosis not present

## 2016-11-20 DIAGNOSIS — M79671 Pain in right foot: Secondary | ICD-10-CM | POA: Diagnosis not present

## 2016-11-20 DIAGNOSIS — B070 Plantar wart: Secondary | ICD-10-CM | POA: Diagnosis not present

## 2017-01-28 DIAGNOSIS — R69 Illness, unspecified: Secondary | ICD-10-CM | POA: Diagnosis not present

## 2017-02-04 DIAGNOSIS — H401221 Low-tension glaucoma, left eye, mild stage: Secondary | ICD-10-CM | POA: Diagnosis not present

## 2017-08-26 DIAGNOSIS — S71111A Laceration without foreign body, right thigh, initial encounter: Secondary | ICD-10-CM | POA: Diagnosis not present

## 2017-08-26 DIAGNOSIS — W298XXA Contact with other powered powered hand tools and household machinery, initial encounter: Secondary | ICD-10-CM | POA: Diagnosis not present

## 2017-08-26 DIAGNOSIS — S81811A Laceration without foreign body, right lower leg, initial encounter: Secondary | ICD-10-CM | POA: Diagnosis not present

## 2017-08-26 DIAGNOSIS — Z23 Encounter for immunization: Secondary | ICD-10-CM | POA: Diagnosis not present

## 2017-08-26 DIAGNOSIS — S76921A Laceration of unspecified muscles, fascia and tendons at thigh level, right thigh, initial encounter: Secondary | ICD-10-CM | POA: Diagnosis not present

## 2017-08-26 DIAGNOSIS — W312XXA Contact with powered woodworking and forming machines, initial encounter: Secondary | ICD-10-CM | POA: Diagnosis not present

## 2017-08-26 DIAGNOSIS — R61 Generalized hyperhidrosis: Secondary | ICD-10-CM | POA: Diagnosis not present

## 2017-08-26 DIAGNOSIS — M79604 Pain in right leg: Secondary | ICD-10-CM | POA: Diagnosis not present

## 2017-08-30 DIAGNOSIS — S71111A Laceration without foreign body, right thigh, initial encounter: Secondary | ICD-10-CM | POA: Diagnosis not present

## 2017-09-11 DIAGNOSIS — S71111D Laceration without foreign body, right thigh, subsequent encounter: Secondary | ICD-10-CM | POA: Diagnosis not present

## 2017-09-13 DIAGNOSIS — T8131XA Disruption of external operation (surgical) wound, not elsewhere classified, initial encounter: Secondary | ICD-10-CM | POA: Diagnosis not present

## 2017-09-13 DIAGNOSIS — S71111A Laceration without foreign body, right thigh, initial encounter: Secondary | ICD-10-CM | POA: Diagnosis not present

## 2017-09-13 DIAGNOSIS — T888XXA Other specified complications of surgical and medical care, not elsewhere classified, initial encounter: Secondary | ICD-10-CM | POA: Diagnosis not present

## 2017-09-13 DIAGNOSIS — J449 Chronic obstructive pulmonary disease, unspecified: Secondary | ICD-10-CM | POA: Diagnosis not present

## 2017-09-13 DIAGNOSIS — Y839 Surgical procedure, unspecified as the cause of abnormal reaction of the patient, or of later complication, without mention of misadventure at the time of the procedure: Secondary | ICD-10-CM | POA: Diagnosis not present

## 2017-09-13 DIAGNOSIS — T8130XA Disruption of wound, unspecified, initial encounter: Secondary | ICD-10-CM | POA: Diagnosis not present

## 2017-09-13 DIAGNOSIS — L539 Erythematous condition, unspecified: Secondary | ICD-10-CM | POA: Diagnosis not present

## 2017-12-10 DIAGNOSIS — H40013 Open angle with borderline findings, low risk, bilateral: Secondary | ICD-10-CM | POA: Diagnosis not present

## 2018-01-30 DIAGNOSIS — R69 Illness, unspecified: Secondary | ICD-10-CM | POA: Diagnosis not present

## 2018-02-19 NOTE — Progress Notes (Addendum)
Subjective:    Patient ID: Brandon Schultz, male    DOB: October 13, 1950, 67 y.o.   MRN: [id number]  HPI:05/16/16 OV:   Brandon Schultz presents to establish as a new pt.  He had labs drawn last month, and recently visited Optometrist (bil blepharoplasty) and Audiologist (bil hearing aids recommended).  Hx of syncopal episode 2014, had full cardiac work-up that was all negative, no f/u with Cardiologist.  Chronic neck/back/bil knee pain r/t to "hard work my entire life".  Pain managed by rest and OTC NSAIDs.  Hx of colon polyps, denies current GI sx's-needs colonoscopy this year.   Hx of tobacco use, now reports "social smoking", estimates "1 cigarette [place]  He has had a productive cough for the last 3 weeks.  Reports mild dyspnea when coughing and denies recent tobacco use.   02/24/18 OV: Brandon Schultz presents with mild dysuria and urgency that will develop every few days and has been ongoing for years.  He was previously seen by Urologist years ago and reports work-up negative.  He also reports serial UAs have always been normal. He denies abdominal pain He denies hematuria/hematochezia He is adopted, therefore unsure of prostate family hx   Ref Range & Units 55yr ago 22yr ago 77yr ago  PSA 0.10 - 4.00 ng/mL 0.55  0.58  0.55     Of Note- This is his first follow-up since establishing with practice Jan 2018 Patient Care Team    Relationship Specialty Notifications Start End  Brandon Grandchild, NP PCP - General Family Medicine  05/16/16   Brandon Dopp, MD Referring Physician Dermatology  05/16/16     Patient Active Problem List   Diagnosis Date Noted  . Urgency of urination 02/24/2018  . Healthcare maintenance 02/24/2018  . Colon polyps   . Persistent cough 05/16/2016  . Elevated blood sugar 05/16/2016  . Elevated cholesterol 05/16/2016  . Decreased hearing 03/29/2016  . Routine general medical examination at a health care facility 08/18/2014  .  Medicare annual wellness visit, subsequent 08/18/2014  . Sore throat 07/15/2014  . Abdominal pain, unspecified site 02/20/2013  . Encounter to establish care with new doctor 02/20/2013  . Hemorrhoids 07/08/2012  . Mixed hyperlipidemia 07/28/2008  . DEPRESSION 07/28/2008  . Chronic bronchitis ([place]) 07/28/2008  . GERD 07/28/2008  . Osteoarthritis 07/28/2008  . SYNCOPE AND COLLAPSE 07/28/2008     Past Medical History:  Diagnosis Date  . Allergy    seasonal  . Arthritis   . Asthma   . Colon polyps   . Colon polyps   . COPD (chronic obstructive pulmonary disease) ([place])   . Diverticulitis   . Emphysema   . GERD (gastroesophageal reflux disease)   . Gum disease   . H/O degenerative disc disease   . Hemorrhoids   . Inguinal hernia   . Melanoma ([place])    facial  . Renal disease    Bright's Disease-childhood  . Syncope   . Umbilical hernia   . Urinary tract infection      Past Surgical History:  Procedure Laterality Date  . COLONOSCOPY    . DENTAL SURGERY    . HERNIA REPAIR     Umbilical & Inguinal  . left knee surgery    . MELANOMA EXCISION     facial  . neck and shoulder injection    . POLYPECTOMY    . WISDOM TOOTH EXTRACTION       Family History  Problem  Relation Age of Onset  . Colon cancer Mother   . Heart attack Father        Deceased-Early 23s  . Cancer Maternal Grandfather   . Dementia Maternal Grandmother   . Syncope episode Sister   . Healthy Sister        x1  . Healthy Daughter        x2  . Healthy Son        x2  . Colon polyps Neg Hx   . Rectal cancer Neg Hx   . Stomach cancer Neg Hx      Social History   Substance and Sexual Activity  Drug Use No     Social History   Substance and Sexual Activity  Alcohol Use Yes  . Alcohol/week: 14.0 standard drinks  . Types: 14 Cans of beer per week   Comment: 2 beers a day maybe      Social History   Tobacco Use  Smoking Status Former Smoker  . Packs/day: 2.00  . Years: 40.00  .  Pack years: 80.00  . Types: Cigarettes  . Last attempt to quit: 04/16/2014  . Years since quitting: 3.8  Smokeless Tobacco Former User     Outpatient Encounter Medications as of 02/24/2018  Medication Sig  . albuterol (PROVENTIL HFA;VENTOLIN HFA) 108 (90 BASE) MCG/ACT inhaler Inhale 2 puffs into the lungs every 6 (six) hours as needed for wheezing or shortness of breath.  . doxycycline (PERIOSTAT) 20 MG tablet Take 20 mg by mouth 2 (two) times daily.  Marland Kitchen ibuprofen (ADVIL,MOTRIN) 600 MG tablet Take 1 tablet (600 mg total) by mouth every 6 (six) hours as needed.  . latanoprost (XALATAN) 0.005 % ophthalmic solution 1 drop at bedtime.  . Probiotic Product (PROBIOTIC-10) CAPS Take by mouth.  . [DISCONTINUED] 0.9 %  sodium chloride infusion    No facility-administered encounter medications on file as of 02/24/2018.     Allergies: Patient has no known allergies.  Body mass index is 30.82 kg/m.  Blood pressure 137/80, pulse 74, temperature (!) 97.3 F (36.3 C), temperature source Oral, weight 221 lb (100.2 kg), SpO2 97 %.   Review of Systems  Constitutional: Negative for activity change, appetite change, chills, diaphoresis, fatigue, fever and unexpected weight change.  HENT: Positive for congestion, hearing loss and postnasal drip. Negative for sore throat, trouble swallowing and voice change.   Eyes: Negative for visual disturbance.  Respiratory:  Negative for wheezing and stridor.   Cardiovascular: Negative for chest pain and leg swelling.  Gastrointestinal: Negative for abdominal distention, nausea and vomiting.  Endocrine: Negative for cold intolerance, heat intolerance, polydipsia, polyphagia and polyuria.  Genitourinary: Positive: Dysuria, Urgency  Musculoskeletal: Positive for arthralgias, back pain, joint swelling, myalgias, neck pain and neck stiffness. Negative for gait problem.  Skin: Negative for color change, pallor, rash and wound.  Neurological: Negative for dizziness,  speech difficulty, weakness, light-headedness and headaches.  Psychiatric/Behavioral: Negative for agitation and behavioral problems. The patient is not nervous/anxious.        Objective:   Physical Exam  Constitutional: He is oriented to person, place, and time. He appears well-developed and well-nourished. He appears  HENT:  Head: Normocephalic and atraumatic.  HEARING LOSS-BILATERAL Right Ear: External ear normal.  Left Ear: External ear normal.  Eyes: Conjunctivae and EOM are normal. Pupils are equal, round, and reactive to light.  Neck: Normal range of motion. Neck supple. .  Cardiovascular: Normal rate, regular rhythm, normal heart sounds and intact distal pulses.  No murmur heard. Pulmonary/Chest: Effort normal and breath sounds normal. No respiratory distress. He has no wheezes. He exhibits no tenderness.  Abdominal: Soft. Bowel sounds are normal.  Musculoskeletal: He exhibits no tenderness.  Lymphadenopathy:    He has no cervical adenopathy.  Neurological: He is alert and oriented to person, place, and time. He has normal reflexes.  Skin: Skin is warm and dry. No rash noted. He is not diaphoretic. No erythema. No pallor.  Psychiatric: He has a normal mood and affect. His behavior is normal. Judgment and thought content normal.  Nursing note and vitals reviewed.    Assessment & Plan:   1. Urgency of urination   2. Healthcare maintenance     Healthcare maintenance Urinalysis is normal. We will call you when PSA lab work is back. Continue to drink plenty of water. Please bring Urologist information with you to you next appt next month- Complete physical with fasting labs.  Urgency of urination UA-normal    FOLLOW-UP:  Return in about 4 weeks (around 03/24/2018) for CPE, Fasting Labs.

## 2018-02-24 ENCOUNTER — Ambulatory Visit (INDEPENDENT_AMBULATORY_CARE_PROVIDER_SITE_OTHER): Payer: Medicare HMO | Admitting: Adult Health

## 2018-02-24 ENCOUNTER — Encounter: Payer: Self-pay | Admitting: Adult Health

## 2018-02-24 VITALS — BP 137/80 | HR 74 | Temp 97.3°F | Wt 221.0 lb

## 2018-02-24 DIAGNOSIS — Z0000 Encounter for general adult medical examination without abnormal findings: Secondary | ICD-10-CM

## 2018-02-24 DIAGNOSIS — Z122 Encounter for screening for malignant neoplasm of respiratory organs: Secondary | ICD-10-CM | POA: Insufficient documentation

## 2018-02-24 DIAGNOSIS — R3915 Urgency of urination: Secondary | ICD-10-CM | POA: Diagnosis not present

## 2018-02-24 DIAGNOSIS — Z87891 Personal history of nicotine dependence: Secondary | ICD-10-CM | POA: Insufficient documentation

## 2018-02-24 LAB — POCT URINALYSIS DIPSTICK
Bilirubin, UA: NEGATIVE
Glucose, UA: NEGATIVE
Ketones, UA: NEGATIVE
Leukocytes, UA: NEGATIVE
Nitrite, UA: NEGATIVE
PH UA: 7 (ref 5.0–8.0)
Protein, UA: NEGATIVE
Spec Grav, UA: 1.01 (ref 1.010–1.025)
UROBILINOGEN UA: 0.2 U/dL

## 2018-02-24 NOTE — Assessment & Plan Note (Signed)
Urinalysis is normal. We will call you when PSA lab work is back. Continue to drink plenty of water. Please bring Urologist information with you to you next appt next month- Complete physical with fasting labs.

## 2018-02-24 NOTE — Assessment & Plan Note (Signed)
UA normal.

## 2018-02-24 NOTE — Patient Instructions (Addendum)
Dysuria Dysuria is pain or discomfort while urinating. The pain or discomfort may be felt in the tube that carries urine out of the bladder (urethra) or in the surrounding tissue of the genitals. The pain may also be felt in the groin area, lower abdomen, and lower back. You may have to urinate frequently or have the sudden feeling that you have to urinate (urgency). Dysuria can affect both men and women, but is more common in women. Dysuria can be caused by many different things, including:  Urinary tract infection in women.  Infection of the kidney or bladder.  Kidney stones or bladder stones.  Certain sexually transmitted infections (STIs), such as chlamydia.  Dehydration.  Inflammation of the vagina.  Use of certain medicines.  Use of certain soaps or scented products that cause irritation.  Follow these instructions at home: Watch your dysuria for any changes. The following actions may help to reduce any discomfort you are feeling:  Drink enough fluid to keep your urine clear or pale yellow.  Empty your bladder often. Avoid holding urine for long periods of time.  After a bowel movement or urination, women should cleanse from front to back, using each tissue only once.  Empty your bladder after sexual intercourse.  Take medicines only as directed by your health care provider.  If you were prescribed an antibiotic medicine, finish it all even if you start to feel better.  Avoid caffeine, tea, and alcohol. They can irritate the bladder and make dysuria worse. In men, alcohol may irritate the prostate.  Keep all follow-up visits as directed by your health care provider. This is important.  If you had any tests done to find the cause of dysuria, it is your responsibility to obtain your test results. Ask the lab or department performing the test when and how you will get your results. Talk with your health care provider if you have any questions about your results.  Contact a  health care provider if:  You develop pain in your back or sides.  You have a fever.  You have nausea or vomiting.  You have blood in your urine.  You are not urinating as often as you usually do. Get help right away if:  You pain is severe and not relieved with medicines.  You are unable to hold down any fluids.  You or someone else notices a change in your mental function.  You have a rapid heartbeat at rest.  You have shaking or chills.  You feel extremely weak. This information is not intended to replace advice given to you by your health care provider. Make sure you discuss any questions you have with your health care provider. Document Released: 12/30/2003 Document Revised: 09/08/2015 Document Reviewed: 11/26/2013 Elsevier Interactive Patient Education  [employer].  Urinalysis is normal. We will call you when PSA lab work is back. Continue to drink plenty of water. Please bring Urologist information with you to you next appt next month- Complete physical with fasting labs. NICE TO SEE YOU!

## 2018-02-25 LAB — PSA: PROSTATE SPECIFIC AG, SERUM: 0.8 ng/mL (ref 0.0–4.0)

## 2018-02-27 DIAGNOSIS — H903 Sensorineural hearing loss, bilateral: Secondary | ICD-10-CM | POA: Diagnosis not present

## 2018-03-25 NOTE — Progress Notes (Addendum)
Subjective:    Patient ID: Brandon Schultz, male    DOB: [birth year], 67 y.o.   MRN: [id number]  HPI:  Brandon Schultz is here for CPE. He reports productive cough (yellow/green mucus), clear nasal drainage and increase in fatigue the last three weeks. He denies current tobacco use. 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 He denies following any sort of diet He denies CP/dyspnea/dizziness/palpititations He reports chronic urinary sx's- Difficulty getting a stream and urinary dribbling He reports seeing a Urologist >10 years ago and was told "I have a big prostate" He is adopted and does not have any family medical hx He denies hematuria  Unable to obtain fasting labs today  Healthcare Maintenance: Colonoscopy-UTD LDCT-Ordered AAA Screening-Ordered  Immunizations-UTD  Patient Care Team    Relationship Specialty Notifications Start End  Danford, Berna Spare, NP PCP - General Family Medicine  05/16/16   Devra Dopp, MD Referring Physician Dermatology  05/16/16     Patient Active Problem List   Diagnosis Date Noted  . Bronchitis 03/27/2018  . Urinary dribbling 03/27/2018  . History of BPH 03/27/2018  . Urgency of urination 02/24/2018  . Healthcare maintenance 02/24/2018  . Colon polyps   . Persistent cough 05/16/2016  . Elevated blood sugar 05/16/2016  . Elevated cholesterol 05/16/2016  . Decreased hearing 03/29/2016  . Routine general medical examination at a health care facility 08/18/2014  . Medicare annual wellness visit, subsequent 08/18/2014  . Sore throat 07/15/2014  . Abdominal pain, unspecified site 02/20/2013  . Encounter to establish care with new doctor 02/20/2013  . Hemorrhoids 07/08/2012  . Mixed hyperlipidemia 07/28/2008  . DEPRESSION 07/28/2008  . Chronic bronchitis ([place]) 07/28/2008  . GERD 07/28/2008  . Osteoarthritis 07/28/2008  . SYNCOPE AND COLLAPSE 07/28/2008     Past Medical History:  Diagnosis  Date  . Allergy    seasonal  . Arthritis   . Asthma   . Colon polyps   . Colon polyps   . COPD (chronic obstructive pulmonary disease) ([place])   . Diverticulitis   . Emphysema   . GERD (gastroesophageal reflux disease)   . Gum disease   . H/O degenerative disc disease   . Hemorrhoids   . Inguinal hernia   . Melanoma ([place])    facial  . Renal disease    Bright's Disease-childhood  . Syncope   . Umbilical hernia   . Urinary tract infection      Past Surgical History:  Procedure Laterality Date  . COLONOSCOPY    . DENTAL SURGERY    . HERNIA REPAIR     Umbilical & Inguinal  . left knee surgery    . MELANOMA EXCISION     facial  . neck and shoulder injection    . POLYPECTOMY    . WISDOM TOOTH EXTRACTION       Family History  Problem Relation Age of Onset  . Colon cancer Mother   . Heart attack Father        Deceased-Early 57s  . Cancer Maternal Grandfather   . Dementia Maternal Grandmother   . Syncope episode Sister   . Healthy Sister        x1  . Healthy Daughter        x2  . Healthy Son        x2  . Colon polyps Neg Hx   . Rectal cancer Neg Hx   . Stomach cancer Neg Hx  Social History   Substance and Sexual Activity  Drug Use No     Social History   Substance and Sexual Activity  Alcohol Use Yes  . Alcohol/week: 14.0 standard drinks  . Types: 14 Cans of beer per week   Comment: 2 beers a day maybe      Social History   Tobacco Use  Smoking Status Former Smoker  . Packs/day: 2.00  . Years: 40.00  . Pack years: 80.00  . Types: Cigarettes  . Last attempt to quit: 04/16/2014  . Years since quitting: 3.9  Smokeless Tobacco Former User     Outpatient Encounter Medications as of 03/27/2018  Medication Sig  . doxycycline (PERIOSTAT) 20 MG tablet Take 20 mg by mouth 2 (two) times daily.  Marland Kitchen ibuprofen (ADVIL,MOTRIN) 600 MG tablet Take 1 tablet (600 mg total) by mouth every 6 (six) hours as needed.  . latanoprost (XALATAN) 0.005 %  ophthalmic solution 1 drop at bedtime.  . Probiotic Product (PROBIOTIC-10) CAPS Take by mouth.  Marland Kitchen azithromycin (ZITHROMAX) 250 MG tablet 2 tabs day one. 1 tab day two-five  . Zoster Vaccine Adjuvanted [facility]) injection Inject 0.5 mLs into the muscle once for 1 dose.  . [DISCONTINUED] albuterol (PROVENTIL HFA;VENTOLIN HFA) 108 (90 BASE) MCG/ACT inhaler Inhale 2 puffs into the lungs every 6 (six) hours as needed for wheezing or shortness of breath.   No facility-administered encounter medications on file as of 03/27/2018.     Allergies: Patient has no known allergies.  Body mass index is 31.26 kg/m.  Blood pressure 108/66, pulse 77, temperature (!) 97.5 F (36.4 C), temperature source Oral, height 5' 9.75" (1.772 m), weight 216 lb 4.8 oz (98.1 kg), SpO2 96 %.  Review of Systems  Constitutional: Positive for fatigue. Negative for activity change, appetite change, chills, diaphoresis, fever and unexpected weight change.  HENT: Positive for congestion, postnasal drip, sinus pressure and voice change. Negative for ear discharge, ear pain and sinus pain.   Eyes: Negative for visual disturbance.  Respiratory: Positive for cough. Negative for chest tightness, shortness of breath, wheezing and stridor.   Cardiovascular: Negative for chest pain, palpitations and leg swelling.  Gastrointestinal: Negative for abdominal distention, abdominal pain, blood in stool, constipation, diarrhea, nausea and vomiting.  Endocrine: Negative for cold intolerance, heat intolerance, polydipsia, polyphagia and polyuria.  Genitourinary: Positive for difficulty urinating. Negative for flank pain.       Difficulty getting a stream and dribbling   Musculoskeletal: Positive for arthralgias and myalgias. Negative for back pain.  Skin: Negative for color change, pallor, rash and wound.  Neurological: Negative for dizziness and headaches.  Hematological: Does not bruise/bleed easily.  Psychiatric/Behavioral: Positive  for dysphoric mood. Negative for agitation, behavioral problems, confusion, decreased concentration, hallucinations, self-injury, sleep disturbance and suicidal ideas. The patient is not nervous/anxious and is not hyperactive.        Objective:   Physical Exam Vitals signs and nursing note reviewed. Exam conducted with a chaperone present.  Constitutional:      General: He is not in acute distress.    Appearance: Normal appearance. He is normal weight. He is not ill-appearing, toxic-appearing or diaphoretic.  HENT:     Head: Normocephalic and atraumatic.     Right Ear: Tympanic membrane, ear canal and external ear normal.     Left Ear: Tympanic membrane, ear canal and external ear normal.     Ears:     Comments: Bil hearing aids    Nose: Congestion present.  Mouth/Throat:     Mouth: Mucous membranes are moist.     Pharynx: Posterior oropharyngeal erythema present.  Eyes:     Extraocular Movements: Extraocular movements intact.     Conjunctiva/sclera: Conjunctivae normal.     Pupils: Pupils are equal, round, and reactive to light.  Neck:     Musculoskeletal: Normal range of motion and neck supple.  Cardiovascular:     Rate and Rhythm: Normal rate.     Pulses: Normal pulses.     Heart sounds: No murmur. No friction rub. No gallop.   Pulmonary:     Effort: Pulmonary effort is normal. No respiratory distress.     Breath sounds: Normal breath sounds. No stridor. No wheezing, rhonchi or rales.  Chest:     Chest wall: No tenderness.  Abdominal:     General: Abdomen is flat. Bowel sounds are normal. There is no distension.     Palpations: Abdomen is soft. There is no mass.     Tenderness: There is no abdominal tenderness. There is no right CVA tenderness, left CVA tenderness, guarding or rebound.     Hernia: No hernia is present.  Genitourinary:    Rectum: Guaiac result negative.     Comments: Slightly enlarged prostate He has known Hx of BPH Musculoskeletal: Normal range of  motion.  Skin:    General: Skin is warm.     Capillary Refill: Capillary refill takes less than 2 seconds.          Comments: Well healed 12cm linear laceration  Neurological:     General: No focal deficit present.     Mental Status: He is alert and oriented to person, place, and time. Mental status is at baseline.  Psychiatric:        Mood and Affect: Mood normal.        Behavior: Behavior normal.        Thought Content: Thought content normal.        Judgment: Judgment normal.       Assessment & Plan:   1. Screening for AAA (abdominal aortic aneurysm)   2. Encounter for screening for malignant neoplasm of respiratory organs   3. Elevated cholesterol   4. Elevated blood sugar   5. Healthcare maintenance   6. Need for pneumococcal vaccination   7. Need for zoster vaccination   8. Mixed hyperlipidemia   9. Bronchitis   10. History of BPH   11. Urinary dribbling     Healthcare maintenance Please take Azithromycin as directed. Continue to drink plenty of water and follow Mediterranean diet.  Low Dose CT and AAA screening ordered today. Please schedule fasting lab appt next week- no sexual contact or biking 48 hrs prior to labs (since we are checking PSA level). .  Mixed hyperlipidemia The 10-year ASCVD risk score Mikey Bussing DC Jr., et al., 2013) is: 12%   Values used to calculate the score:     Age: 53 years     Sex: Male     Is Non-Hispanic African American: No     Diabetic: No     Tobacco smoker: No     Systolic Blood Pressure: 962 mmHg     Is BP treated: No     HDL Cholesterol: 50.3 mg/dL     Total Cholesterol: 252 mg/dL  Fasting labs ordered Not currently on a statin  Bronchitis Azithromycin Increase fluids  Urinary dribbling Known hx of BPH He is adopted, unknown family medical hx Slightly enlarged prostate upon exam-  last contact with Urologist >10 years ago  History of BPH If urinary symptoms worsen, please call us and we will refer you to  Urologist. Recommend annual physical with fasting labs    FOLLOW-UP:  Return in about 1 year (around 03/28/2019) for CPE, Fasting Labs.

## 2018-03-27 ENCOUNTER — Ambulatory Visit (INDEPENDENT_AMBULATORY_CARE_PROVIDER_SITE_OTHER): Payer: Medicare HMO | Admitting: Adult Health

## 2018-03-27 ENCOUNTER — Encounter: Payer: Self-pay | Admitting: Adult Health

## 2018-03-27 VITALS — BP 108/66 | HR 77 | Temp 97.5°F | Ht 69.75 in | Wt 216.3 lb

## 2018-03-27 DIAGNOSIS — Z23 Encounter for immunization: Secondary | ICD-10-CM

## 2018-03-27 DIAGNOSIS — Z87438 Personal history of other diseases of male genital organs: Secondary | ICD-10-CM | POA: Diagnosis not present

## 2018-03-27 DIAGNOSIS — E7800 Pure hypercholesterolemia, unspecified: Secondary | ICD-10-CM

## 2018-03-27 DIAGNOSIS — J40 Bronchitis, not specified as acute or chronic: Secondary | ICD-10-CM

## 2018-03-27 DIAGNOSIS — N3943 Post-void dribbling: Secondary | ICD-10-CM

## 2018-03-27 DIAGNOSIS — Z122 Encounter for screening for malignant neoplasm of respiratory organs: Secondary | ICD-10-CM

## 2018-03-27 DIAGNOSIS — Z136 Encounter for screening for cardiovascular disorders: Secondary | ICD-10-CM

## 2018-03-27 DIAGNOSIS — Z0000 Encounter for general adult medical examination without abnormal findings: Secondary | ICD-10-CM | POA: Diagnosis not present

## 2018-03-27 DIAGNOSIS — E782 Mixed hyperlipidemia: Secondary | ICD-10-CM

## 2018-03-27 DIAGNOSIS — R739 Hyperglycemia, unspecified: Secondary | ICD-10-CM | POA: Diagnosis not present

## 2018-03-27 MED ORDER — ZOSTER VAC RECOMB ADJUVANTED 50 MCG/0.5ML IM SUSR: 0.5000 mL | Freq: Once | INTRAMUSCULAR | 0 refills | Status: AC

## 2018-03-27 MED ORDER — AZITHROMYCIN 250 MG PO TABS: ORAL_TABLET | ORAL | 0 refills | Status: DC

## 2018-03-27 NOTE — Patient Instructions (Addendum)
Preventive Care for Adults, Male A healthy lifestyle and preventive care can promote health and wellness. Preventive health guidelines for men include the following key practices:  A routine yearly physical is a good way to check with your health care provider about your health and preventative screening. It is a chance to share any concerns and updates on your health and to receive a thorough exam.  Visit your dentist for a routine exam and preventative care every 6 months. Brush your teeth twice a day and floss once a day. Good oral hygiene prevents tooth decay and gum disease.  The frequency of eye exams is based on your age, health, family medical history, use of contact lenses, and other factors. Follow your health care provider's recommendations for frequency of eye exams.  Eat a healthy diet. Foods such as vegetables, fruits, whole grains, low-fat dairy products, and lean protein foods contain the nutrients you need without too many calories. Decrease your intake of foods high in solid fats, added sugars, and salt. Eat the right amount of calories for you.Get information about a proper diet from your health care provider, if necessary.  Regular physical exercise is one of the most important things you can do for your health. Most adults should get at least 150 minutes of moderate-intensity exercise (any activity that increases your heart rate and causes you to sweat) each week. In addition, most adults need muscle-strengthening exercises on 2 or more days a week.  Maintain a healthy weight. The body mass index (BMI) is a screening tool to identify possible weight problems. It provides an estimate of body fat based on height and weight. Your health care provider can find your BMI and can help you achieve or maintain a healthy weight.For adults 20 years and older:  A BMI below 18.5 is considered underweight.  A BMI of 18.5 to 24.9 is normal.  A BMI of 25 to 29.9 is considered  overweight.  A BMI of 30 and above is considered obese.  Maintain normal blood lipids and cholesterol levels by exercising and minimizing your intake of saturated fat. Eat a balanced diet with plenty of fruit and vegetables. Blood tests for lipids and cholesterol should begin at age 55 and be repeated every 5 years. If your lipid or cholesterol levels are high, you are over 50, or you are at high risk for heart disease, you may need your cholesterol levels checked more frequently.Ongoing high lipid and cholesterol levels should be treated with medicines if diet and exercise are not working.  If you smoke, find out from your health care provider how to quit. If you do not use tobacco, do not start.  Lung cancer screening is recommended for adults aged 90-80 years who are at high risk for developing lung cancer because of a history of smoking. A yearly low-dose CT scan of the lungs is recommended for people who have at least a 30-pack-year history of smoking and are a current smoker or have quit within the past 15 years. A pack year of smoking is smoking an average of 1 pack of cigarettes a day for 1 year (for example: 1 pack a day for 30 years or 2 packs a day for 15 years). Yearly screening should continue until the smoker has stopped smoking for at least 15 years. Yearly screening should be stopped for people who develop a health problem that would prevent them from having lung cancer treatment.  If you choose to drink alcohol, do not have more  than 2 drinks per day. One drink is considered to be 12 ounces (355 mL) of beer, 5 ounces (148 mL) of wine, or 1.5 ounces (44 mL) of liquor.  Avoid use of street drugs. Do not share needles with anyone. Ask for help if you need support or instructions about stopping the use of drugs.  High blood pressure causes heart disease and increases the risk of stroke. Your blood pressure should be checked at least every 1-2 years. Ongoing high blood pressure should be  treated with medicines, if weight loss and exercise are not effective.  If you are 34-90 years old, ask your health care provider if you should take aspirin to prevent heart disease.  Diabetes screening is done by taking a blood sample to check your blood glucose level after you have not eaten for a certain period of time (fasting). If you are not overweight and you do not have risk factors for diabetes, you should be screened once every 3 years starting at age 35. If you are overweight or obese and you are 70-84 years of age, you should be screened for diabetes every year as part of your cardiovascular risk assessment.  Colorectal cancer can be detected and often prevented. Most routine colorectal cancer screening begins at the age of 18 and continues through age 69. However, your health care provider may recommend screening at an earlier age if you have risk factors for colon cancer. On a yearly basis, your health care provider may provide home test kits to check for hidden blood in the stool. Use of a small camera at the end of a tube to directly examine the colon (sigmoidoscopy or colonoscopy) can detect the earliest forms of colorectal cancer. Talk to your health care provider about this at age 71, when routine screening begins. Direct exam of the colon should be repeated every 5-10 years through age 18, unless early forms of precancerous polyps or small growths are found.  People who are at an increased risk for hepatitis B should be screened for this virus. You are considered at high risk for hepatitis B if:  You were born in a country where hepatitis B occurs often. Talk with your health care provider about which countries are considered high risk.  Your parents were born in a high-risk country and you have not received a shot to protect against hepatitis B (hepatitis B vaccine).  You have HIV or AIDS.  You use needles to inject street drugs.  You live with, or have sex with, someone who  has hepatitis B.  You are a man who has sex with other men (MSM).  You get hemodialysis treatment.  You take certain medicines for conditions such as cancer, organ transplantation, and autoimmune conditions.  Hepatitis C blood testing is recommended for all people born from [birth year] through 1965 and any individual with known risks for hepatitis C.  Practice safe sex. Use condoms and avoid high-risk sexual practices to reduce the spread of sexually transmitted infections (STIs). STIs include gonorrhea, chlamydia, syphilis, trichomonas, herpes, HPV, and human immunodeficiency virus (HIV). Herpes, HIV, and HPV are viral illnesses that have no cure. They can result in disability, cancer, and death.  If you are a man who has sex with other men, you should be screened at least once per year for:  HIV.  Urethral, rectal, and pharyngeal infection of gonorrhea, chlamydia, or both.  If you are at risk of being infected with HIV, it is recommended that you take a  prescription medicine daily to prevent HIV infection. This is called preexposure prophylaxis (PrEP). You are considered at risk if:  You are a man who has sex with other men (MSM) and have other risk factors.  You are a heterosexual man, are sexually active, and are at increased risk for HIV infection.  You take drugs by injection.  You are sexually active with a partner who has HIV.  Talk with your health care provider about whether you are at high risk of being infected with HIV. If you choose to begin PrEP, you should first be tested for HIV. You should then be tested every 3 months for as long as you are taking PrEP.  A one-time screening for abdominal aortic aneurysm (AAA) and surgical repair of large AAAs by ultrasound are recommended for men ages 44 to 66 years who are current or former smokers.  Healthy men should no longer receive prostate-specific antigen (PSA) blood tests as part of routine cancer screening. Talk with your health  care provider about prostate cancer screening.  Testicular cancer screening is not recommended for adult males who have no symptoms. Screening includes self-exam, a health care provider exam, and other screening tests. Consult with your health care provider about any symptoms you have or any concerns you have about testicular cancer.  Use sunscreen. Apply sunscreen liberally and repeatedly throughout the day. You should seek shade when your shadow is shorter than you. Protect yourself by wearing long sleeves, pants, a wide-brimmed hat, and sunglasses year round, whenever you are outdoors.  Once a month, do a whole-body skin exam, using a mirror to look at the skin on your back. Tell your health care provider about new moles, moles that have irregular borders, moles that are larger than a pencil eraser, or moles that have changed in shape or color.  Stay current with required vaccines (immunizations).  Influenza vaccine. All adults should be immunized every year.  Tetanus, diphtheria, and acellular pertussis (Td, Tdap) vaccine. An adult who has not previously received Tdap or who does not know his vaccine status should receive 1 dose of Tdap. This initial dose should be followed by tetanus and diphtheria toxoids (Td) booster doses every 10 years. Adults with an unknown or incomplete history of completing a 3-dose immunization series with Td-containing vaccines should begin or complete a primary immunization series including a Tdap dose. Adults should receive a Td booster every 10 years.  Varicella vaccine. An adult without evidence of immunity to varicella should receive 2 doses or a second dose if he has previously received 1 dose.  Human papillomavirus (HPV) vaccine. Males aged 11-21 years who have not received the vaccine previously should receive the 3-dose series. Males aged 22-26 years may be immunized. Immunization is recommended through the age of 23 years for any male who has sex with males  and did not get any or all doses earlier. Immunization is recommended for any person with an immunocompromised condition through the age of 72 years if he did not get any or all doses earlier. During the 3-dose series, the second dose should be obtained 4-8 weeks after the first dose. The third dose should be obtained 24 weeks after the first dose and 16 weeks after the second dose.  Zoster vaccine. One dose is recommended for adults aged 23 years or older unless certain conditions are present.  Measles, mumps, and rubella (MMR) vaccine. Adults born before [birth year] generally are considered immune to measles and mumps. Adults born in [birth year]  or later should have 1 or more doses of MMR vaccine unless there is a contraindication to the vaccine or there is laboratory evidence of immunity to each of the three diseases. A routine second dose of MMR vaccine should be obtained at least 28 days after the first dose for students attending postsecondary schools, health care workers, or international travelers. People who received inactivated measles vaccine or an unknown type of measles vaccine during 1963-1967 should receive 2 doses of MMR vaccine. People who received inactivated mumps vaccine or an unknown type of mumps vaccine before 1979 and are at high risk for mumps infection should consider immunization with 2 doses of MMR vaccine. Unvaccinated health care workers born before [birth year] who lack laboratory evidence of measles, mumps, or rubella immunity or laboratory confirmation of disease should consider measles and mumps immunization with 2 doses of MMR vaccine or rubella immunization with 1 dose of MMR vaccine.  Pneumococcal 13-valent conjugate (PCV13) vaccine. When indicated, a person who is uncertain of his immunization history and has no record of immunization should receive the PCV13 vaccine. All adults 9 years of age and older should receive this vaccine. An adult aged 69 years or older who has certain medical  conditions and has not been previously immunized should receive 1 dose of PCV13 vaccine. This PCV13 should be followed with a dose of pneumococcal polysaccharide (PPSV23) vaccine. Adults who are at high risk for pneumococcal disease should obtain the PPSV23 vaccine at least 8 weeks after the dose of PCV13 vaccine. Adults older than 66 years of age who have normal immune system function should obtain the PPSV23 vaccine dose at least 1 year after the dose of PCV13 vaccine.  Pneumococcal polysaccharide (PPSV23) vaccine. When PCV13 is also indicated, PCV13 should be obtained first. All adults aged 79 years and older should be immunized. An adult younger than age 43 years who has certain medical conditions should be immunized. Any person who resides in a nursing home or long-term care facility should be immunized. An adult smoker should be immunized. People with an immunocompromised condition and certain other conditions should receive both PCV13 and PPSV23 vaccines. People with human immunodeficiency virus (HIV) infection should be immunized as soon as possible after diagnosis. Immunization during chemotherapy or radiation therapy should be avoided. Routine use of PPSV23 vaccine is not recommended for American Indians, [place] Natives, or people younger than 65 years unless there are medical conditions that require PPSV23 vaccine. When indicated, people who have unknown immunization and have no record of immunization should receive PPSV23 vaccine. One-time revaccination 5 years after the first dose of PPSV23 is recommended for people aged 19-64 years who have chronic kidney failure, nephrotic syndrome, asplenia, or immunocompromised conditions. People who received 1-2 doses of PPSV23 before age 70 years should receive another dose of PPSV23 vaccine at age 79 years or later if at least 5 years have passed since the previous dose. Doses of PPSV23 are not needed for people immunized with PPSV23 at or after age 55  years.  Meningococcal vaccine. Adults with asplenia or persistent complement component deficiencies should receive 2 doses of quadrivalent meningococcal conjugate (MenACWY-D) vaccine. The doses should be obtained at least 2 months apart. Microbiologists working with certain meningococcal bacteria, [employer] recruits, people at risk during an outbreak, and people who travel to or live in countries with a high rate of meningitis should be immunized. A first-year college student up through age 64 years who is living in a residence hall should receive a  dose if he did not receive a dose on or after his 16th birthday. Adults who have certain high-risk conditions should receive one or more doses of vaccine.  Hepatitis A vaccine. Adults who wish to be protected from this disease, have chronic liver disease, work with hepatitis A-infected animals, work in hepatitis A research labs, or travel to or work in countries with a high rate of hepatitis A should be immunized. Adults who were previously unvaccinated and who anticipate close contact with an international adoptee during the first 60 days after arrival in the [place] from a country with a high rate of hepatitis A should be immunized.  Hepatitis B vaccine. Adults should be immunized if they wish to be protected from this disease, are under age 34 years and have diabetes, have chronic liver disease, have had more than one sex partner in the past 6 months, may be exposed to blood or other infectious body fluids, are household contacts or sex partners of hepatitis B positive people, are clients or workers in certain care facilities, or travel to or work in countries with a high rate of hepatitis B.  Haemophilus influenzae type b (Hib) vaccine. A previously unvaccinated person with asplenia or sickle cell disease or having a scheduled splenectomy should receive 1 dose of Hib vaccine. Regardless of previous immunization, a recipient of a hematopoietic stem cell  transplant should receive a 3-dose series 6-12 months after his successful transplant. Hib vaccine is not recommended for adults with HIV infection. Preventive Service / Frequency Ages 77 to 55  Blood pressure check.** / Every 3-5 years.  Lipid and cholesterol check.** / Every 5 years beginning at age 66.  Hepatitis C blood test.** / For any individual with known risks for hepatitis C.  Skin self-exam. / Monthly.  Influenza vaccine. / Every year.  Tetanus, diphtheria, and acellular pertussis (Tdap, Td) vaccine.** / Consult your health care provider. 1 dose of Td every 10 years.  Varicella vaccine.** / Consult your health care provider.  HPV vaccine. / 3 doses over 6 months, if 45 or younger.  Measles, mumps, rubella (MMR) vaccine.** / You need at least 1 dose of MMR if you were born in 1957 or later. You may also need a second dose.  Pneumococcal 13-valent conjugate (PCV13) vaccine.** / Consult your health care provider.  Pneumococcal polysaccharide (PPSV23) vaccine.** / 1 to 2 doses if you smoke cigarettes or if you have certain conditions.  Meningococcal vaccine.** / 1 dose if you are age 81 to 79 years and a Market researcher living in a residence hall, or have one of several medical conditions. You may also need additional booster doses.  Hepatitis A vaccine.** / Consult your health care provider.  Hepatitis B vaccine.** / Consult your health care provider.  Haemophilus influenzae type b (Hib) vaccine.** / Consult your health care provider. Ages 6 to 58  Blood pressure check.** / Every year.  Lipid and cholesterol check.** / Every 5 years beginning at age 89.  Lung cancer screening. / Every year if you are aged 84-80 years and have a 30-pack-year history of smoking and currently smoke or have quit within the past 15 years. Yearly screening is stopped once you have quit smoking for at least 15 years or develop a health problem that would prevent you from having  lung cancer treatment.  Fecal occult blood test (FOBT) of stool. / Every year beginning at age 90 and continuing until age 73. You may not have to do  this test if you get a colonoscopy every 10 years.  Flexible sigmoidoscopy** or colonoscopy.** / Every 5 years for a flexible sigmoidoscopy or every 10 years for a colonoscopy beginning at age 50 and continuing until age 75.  Hepatitis C blood test.** / For all people born from 1945 through 1965 and any individual with known risks for hepatitis C.  Skin self-exam. / Monthly.  Influenza vaccine. / Every year.  Tetanus, diphtheria, and acellular pertussis (Tdap/Td) vaccine.** / Consult your health care provider. 1 dose of Td every 10 years.  Varicella vaccine.** / Consult your health care provider.  Zoster vaccine.** / 1 dose for adults aged 60 years or older.  Measles, mumps, rubella (MMR) vaccine.** / You need at least 1 dose of MMR if you were born in 1957 or later. You may also need a second dose.  Pneumococcal 13-valent conjugate (PCV13) vaccine.** / Consult your health care provider.  Pneumococcal polysaccharide (PPSV23) vaccine.** / 1 to 2 doses if you smoke cigarettes or if you have certain conditions.  Meningococcal vaccine.** / Consult your health care provider.  Hepatitis A vaccine.** / Consult your health care provider.  Hepatitis B vaccine.** / Consult your health care provider.  Haemophilus influenzae type b (Hib) vaccine.** / Consult your health care provider. Ages 65 and over  Blood pressure check.** / Every year.  Lipid and cholesterol check.**/ Every 5 years beginning at age 20.  Lung cancer screening. / Every year if you are aged 55-80 years and have a 30-pack-year history of smoking and currently smoke or have quit within the past 15 years. Yearly screening is stopped once you have quit smoking for at least 15 years or develop a health problem that would prevent you from having lung cancer treatment.  Fecal  occult blood test (FOBT) of stool. / Every year beginning at age 50 and continuing until age 75. You may not have to do this test if you get a colonoscopy every 10 years.  Flexible sigmoidoscopy** or colonoscopy.** / Every 5 years for a flexible sigmoidoscopy or every 10 years for a colonoscopy beginning at age 50 and continuing until age 75.  Hepatitis C blood test.** / For all people born from 1945 through 1965 and any individual with known risks for hepatitis C.  Abdominal aortic aneurysm (AAA) screening.** / A one-time screening for ages 65 to 75 years who are current or former smokers.  Skin self-exam. / Monthly.  Influenza vaccine. / Every year.  Tetanus, diphtheria, and acellular pertussis (Tdap/Td) vaccine.** / 1 dose of Td every 10 years.  Varicella vaccine.** / Consult your health care provider.  Zoster vaccine.** / 1 dose for adults aged 60 years or older.  Pneumococcal 13-valent conjugate (PCV13) vaccine.** / 1 dose for all adults aged 65 years and older.  Pneumococcal polysaccharide (PPSV23) vaccine.** / 1 dose for all adults aged 65 years and older.  Meningococcal vaccine.** / Consult your health care provider.  Hepatitis A vaccine.** / Consult your health care provider.  Hepatitis B vaccine.** / Consult your health care provider.  Haemophilus influenzae type b (Hib) vaccine.** / Consult your health care provider. **Family history and personal history of risk and conditions may change your health care provider's recommendations.   This information is not intended to replace advice given to you by your health care provider. Make sure you discuss any questions you have with your health care provider.   Document Released: 05/29/2001 Document Revised: 04/23/2014 Document Reviewed: 08/28/2010 Elsevier Interactive Patient Education 2016   [place] refers to food and lifestyle choices that are based on the traditions of  countries located on the [employer]. This way of eating has been shown to help prevent certain conditions and improve outcomes for people who have chronic diseases, like kidney disease and heart disease. What are tips for following this plan? Lifestyle  Cook and eat meals together with your family, when possible.  Drink enough fluid to keep your urine clear or pale yellow.  Be physically active every day. This includes: ? Aerobic exercise like running or swimming. ? Leisure activities like gardening, walking, or housework.  Get 7-8 hours of sleep each night.  If recommended by your health care provider, drink red wine in moderation. This means 1 glass a day for nonpregnant women and 2 glasses a day for men. A glass of wine equals 5 oz (150 mL). Reading food labels  Check the serving size of packaged foods. For foods such as rice and pasta, the serving size refers to the amount of cooked product, not dry.  Check the total fat in packaged foods. Avoid foods that have saturated fat or trans fats.  Check the ingredients list for added sugars, such as corn syrup. Shopping  At the grocery store, buy most of your food from the areas near the walls of the store. This includes: ? Fresh fruits and vegetables (produce). ? Grains, beans, nuts, and seeds. Some of these may be available in unpackaged forms or large amounts (in bulk). ? Fresh seafood. ? Poultry and eggs. ? Low-fat dairy products.  Buy whole ingredients instead of prepackaged foods.  Buy fresh fruits and vegetables in-season from local farmers markets.  Buy frozen fruits and vegetables in resealable bags.  If you do not have access to quality fresh seafood, buy precooked frozen shrimp or canned fish, such as tuna, salmon, or sardines.  Buy small amounts of raw or cooked vegetables, salads, or olives from the deli or salad bar at your store.  Stock your pantry so you always have certain foods on hand, such as olive  oil, canned tuna, canned tomatoes, rice, pasta, and beans. Cooking  Cook foods with extra-virgin olive oil instead of using butter or other vegetable oils.  Have meat as a side dish, and have vegetables or grains as your main dish. This means having meat in small portions or adding small amounts of meat to foods like pasta or stew.  Use beans or vegetables instead of meat in common dishes like chili or lasagna.  Experiment with different cooking methods. Try roasting or broiling vegetables instead of steaming or sauteing them.  Add frozen vegetables to soups, stews, pasta, or rice.  Add nuts or seeds for added healthy fat at each meal. You can add these to yogurt, salads, or vegetable dishes.  Marinate fish or vegetables using olive oil, lemon juice, garlic, and fresh herbs. Meal planning  Plan to eat 1 vegetarian meal one day each week. Try to work up to 2 vegetarian meals, if possible.  Eat seafood 2 or more times a week.  Have healthy snacks readily available, such as: ? Vegetable sticks with hummus. ? [place] yogurt. ? Fruit and nut trail mix.  Eat balanced meals throughout the week. This includes: ? Fruit: 2-3 servings a day ? Vegetables: 4-5 servings a day ? Low-fat dairy: 2 servings a day ? Fish, poultry, or lean meat: 1 serving a day ? Beans and legumes: 2 or more  servings a week ? Nuts and seeds: 1-2 servings a day ? Whole grains: 6-8 servings a day ? Extra-virgin olive oil: 3-4 servings a day  Limit red meat and sweets to only a few servings a month What are my food choices?  Mediterranean diet ? Recommended ? Grains: Whole-grain pasta. Brown rice. Bulgar wheat. Polenta. Couscous. Whole-wheat bread. [place]. ? Vegetables: Artichokes. Beets. Broccoli. Cabbage. Carrots. Eggplant. Green beans. Chard. Kale. Spinach. Onions. Leeks. Peas. Squash. Tomatoes. Peppers. Radishes. ? Fruits: Apples. Apricots. Avocado. Berries. Bananas. Cherries. Dates. Figs. Grapes.  Lemons. Melon. Oranges. Peaches. Plums. Pomegranate. ? Meats and other protein foods: Beans. Almonds. Sunflower seeds. Pine nuts. Peanuts. [place]. Salmon. Scallops. Shrimp. [place]. Tilapia. Clams. Oysters. Eggs. ? Dairy: Low-fat milk. Cheese. Greek yogurt. ? Beverages: Water. Red wine. Herbal tea. ? Fats and oils: Extra virgin olive oil. Avocado oil. Grape seed oil. ? Sweets and desserts: [place] yogurt with honey. Baked apples. Poached pears. Trail mix. ? Seasoning and other foods: Basil. Cilantro. Coriander. Cumin. Mint. Parsley. Sage. Rosemary. Tarragon. Garlic. Oregano. Thyme. Pepper. Balsalmic vinegar. Tahini. Hummus. Tomato sauce. Olives. Mushrooms. ? Limit these ? Grains: Prepackaged pasta or rice dishes. Prepackaged cereal with added sugar. ? Vegetables: Deep fried potatoes (french fries). ? Fruits: Fruit canned in syrup. ? Meats and other protein foods: Beef. Pork. Lamb. Poultry with skin. Hot dogs. [place] Salines. ? Dairy: Ice cream. Sour cream. Whole milk. ? Beverages: Juice. Sugar-sweetened soft drinks. Beer. Liquor and spirits. ? Fats and oils: Butter. Canola oil. Vegetable oil. Beef fat (tallow). Lard. ? Sweets and desserts: Cookies. Cakes. Pies. Candy. ? Seasoning and other foods: Mayonnaise. Premade sauces and marinades. ? The items listed may not be a complete list. Talk with your dietitian about what dietary choices are right for you. Summary  The Mediterranean diet includes both food and lifestyle choices.  Eat a variety of fresh fruits and vegetables, beans, nuts, seeds, and whole grains.  Limit the amount of red meat and sweets that you eat.  Talk with your health care provider about whether it is safe for you to drink red wine in moderation. This means 1 glass a day for nonpregnant women and 2 glasses a day for men. A glass of wine equals 5 oz (150 mL). This information is not intended to replace advice given to you by your health care provider. Make sure you discuss any questions  you have with your health care provider. Document Released: 11/24/2015 Document Revised: 12/27/2015 Document Reviewed: 11/24/2015 Elsevier Interactive Patient Education  [employer].   Please take Azithromycin as directed. Continue to drink plenty of water and follow Mediterranean diet.  Low Dose CT and AAA screening ordered today. Please schedule fasting lab appt next week- no sexual contact or biking 48 hrs prior to labs (since we are checking PSA level). If urinary symptoms worsen, please call us and we will refer you to Urologist. Recommend annual physical with fasting labs. NICE TO SEE YOU!

## 2018-03-27 NOTE — Assessment & Plan Note (Signed)
>>  ASSESSMENT AND PLAN FOR BRONCHITIS WRITTEN ON 03/27/2018 11:44 AM BY DANFORD, KATY D, NP  Azithromycin Increase fluids

## 2018-03-27 NOTE — Assessment & Plan Note (Signed)
Azithromycin Increase fluids

## 2018-03-27 NOTE — Assessment & Plan Note (Addendum)
Please take Azithromycin as directed. Continue to drink plenty of water and follow Mediterranean diet.  Low Dose CT and AAA screening ordered today. Please schedule fasting lab appt next week- no sexual contact or biking 48 hrs prior to labs (since we are checking PSA level). Brandon Schultz

## 2018-03-27 NOTE — Assessment & Plan Note (Signed)
The 10-year ASCVD risk score Brandon Schultz., et al., 2013) is: 12%   Values used to calculate the score:     Age: 66 years     Sex: Male     Is Non-Hispanic African American: No     Diabetic: No     Tobacco smoker: No     Systolic Blood Pressure: 388 mmHg     Is BP treated: No     HDL Cholesterol: 50.3 mg/dL     Total Cholesterol: 252 mg/dL  Fasting labs ordered Not currently on a statin

## 2018-03-27 NOTE — Assessment & Plan Note (Signed)
If urinary symptoms worsen, please call us and we will refer you to Urologist. Recommend annual physical with fasting labs

## 2018-03-27 NOTE — Assessment & Plan Note (Signed)
Known hx of BPH He is adopted, unknown family medical hx Slightly enlarged prostate upon exam- last contact with Urologist >10 years ago

## 2018-03-28 ENCOUNTER — Telehealth: Payer: Self-pay | Admitting: Adult Health

## 2018-03-28 DIAGNOSIS — Z23 Encounter for immunization: Secondary | ICD-10-CM

## 2018-03-28 NOTE — Telephone Encounter (Signed)
Patient had Zoster and pneumonia vaccine shot done at [facility] in [place] on 02/21/15. Patient wants to discuss with the nurse the next steps of what needs to happen to be up to date on these items. Please advise

## 2018-03-31 MED ORDER — ZOSTER VAC RECOMB ADJUVANTED 50 MCG/0.5ML IM SUSR: 0.5000 mL | Freq: Once | INTRAMUSCULAR | 0 refills | Status: AC

## 2018-03-31 MED ORDER — PNEUMOCOCCAL VAC POLYVALENT 25 MCG/0.5ML IJ INJ: 0.5000 mL | INJECTION | INTRAMUSCULAR | 0 refills | Status: AC

## 2018-03-31 NOTE — Addendum Note (Signed)
Addended by: Fonnie Mu on: 03/31/2018 04:57 PM   Modules accepted: Orders

## 2018-04-02 ENCOUNTER — Other Ambulatory Visit: Payer: Medicare HMO

## 2018-04-02 DIAGNOSIS — Z0000 Encounter for general adult medical examination without abnormal findings: Secondary | ICD-10-CM | POA: Diagnosis not present

## 2018-04-02 DIAGNOSIS — R739 Hyperglycemia, unspecified: Secondary | ICD-10-CM | POA: Diagnosis not present

## 2018-04-02 DIAGNOSIS — R3915 Urgency of urination: Secondary | ICD-10-CM | POA: Diagnosis not present

## 2018-04-02 DIAGNOSIS — Z87438 Personal history of other diseases of male genital organs: Secondary | ICD-10-CM | POA: Diagnosis not present

## 2018-04-02 DIAGNOSIS — E7800 Pure hypercholesterolemia, unspecified: Secondary | ICD-10-CM | POA: Diagnosis not present

## 2018-04-02 DIAGNOSIS — R69 Illness, unspecified: Secondary | ICD-10-CM | POA: Diagnosis not present

## 2018-04-02 DIAGNOSIS — N3943 Post-void dribbling: Secondary | ICD-10-CM | POA: Diagnosis not present

## 2018-04-03 ENCOUNTER — Ambulatory Visit
Admission: RE | Admit: 2018-04-03 | Discharge: 2018-04-03 | Disposition: A | Discharge status: Home / Self Care | Payer: Medicare HMO | Source: Ambulatory Visit | Attending: Adult Health | Admitting: Adult Health

## 2018-04-03 ENCOUNTER — Other Ambulatory Visit: Payer: Self-pay | Admitting: Adult Health

## 2018-04-03 ENCOUNTER — Encounter: Payer: Self-pay | Admitting: Adult Health

## 2018-04-03 DIAGNOSIS — Z79899 Other long term (current) drug therapy: Secondary | ICD-10-CM

## 2018-04-03 DIAGNOSIS — Z0000 Encounter for general adult medical examination without abnormal findings: Secondary | ICD-10-CM

## 2018-04-03 DIAGNOSIS — Z122 Encounter for screening for malignant neoplasm of respiratory organs: Secondary | ICD-10-CM

## 2018-04-03 DIAGNOSIS — Z136 Encounter for screening for cardiovascular disorders: Secondary | ICD-10-CM | POA: Diagnosis not present

## 2018-04-03 DIAGNOSIS — Z87891 Personal history of nicotine dependence: Secondary | ICD-10-CM | POA: Diagnosis not present

## 2018-04-03 DIAGNOSIS — E7800 Pure hypercholesterolemia, unspecified: Secondary | ICD-10-CM

## 2018-04-03 LAB — LIPID PANEL
CHOL/HDL RATIO: 4.6 ratio (ref 0.0–5.0)
Cholesterol, Total: 228 mg/dL — ABNORMAL HIGH (ref 100–199)
HDL: 50 mg/dL (ref >39)
LDL Calculated: 158 mg/dL — ABNORMAL HIGH (ref 0–99)
Triglycerides: 100 mg/dL (ref 0–149)
VLDL Cholesterol Cal: 20 mg/dL (ref 5–40)

## 2018-04-03 LAB — COMPREHENSIVE METABOLIC PANEL
ALT: 14 IU/L (ref 0–44)
AST: 13 IU/L (ref 0–40)
Albumin/Globulin Ratio: 2 (ref 1.2–2.2)
Albumin: 4.4 g/dL (ref 3.6–4.8)
Alkaline Phosphatase: 56 IU/L (ref 39–117)
BUN/Creatinine Ratio: 16 (ref 10–24)
BUN: 16 mg/dL (ref 8–27)
Bilirubin Total: 0.5 mg/dL (ref 0.0–1.2)
CO2: 22 mmol/L (ref 20–29)
Calcium: 9.5 mg/dL (ref 8.6–10.2)
Chloride: 105 mmol/L (ref 96–106)
Creatinine, Ser: 0.99 mg/dL (ref 0.76–1.27)
GFR calc Af Amer: 91 mL/min/{1.73_m2} (ref >59)
GFR, EST NON AFRICAN AMERICAN: 79 mL/min/{1.73_m2} (ref >59)
Globulin, Total: 2.2 g/dL (ref 1.5–4.5)
Glucose: 95 mg/dL (ref 65–99)
Potassium: 4.4 mmol/L (ref 3.5–5.2)
Sodium: 141 mmol/L (ref 134–144)
Total Protein: 6.6 g/dL (ref 6.0–8.5)

## 2018-04-03 LAB — CBC WITH DIFFERENTIAL/PLATELET
BASOS: 1 %
Basophils Absolute: 0 10*3/uL (ref 0.0–0.2)
EOS (ABSOLUTE): 0.2 10*3/uL (ref 0.0–0.4)
Eos: 3 %
Hematocrit: 44.6 % (ref 37.5–51.0)
Hemoglobin: 15.2 g/dL (ref 13.0–17.7)
IMMATURE GRANULOCYTES: 0 %
Immature Grans (Abs): 0 10*3/uL (ref 0.0–0.1)
Lymphocytes Absolute: 2.3 10*3/uL (ref 0.7–3.1)
Lymphs: 29 %
MCH: 30.1 pg (ref 26.6–33.0)
MCHC: 34.1 g/dL (ref 31.5–35.7)
MCV: 88 fL (ref 79–97)
Monocytes Absolute: 0.6 10*3/uL (ref 0.1–0.9)
Monocytes: 7 %
NEUTROS PCT: 60 %
Neutrophils Absolute: 4.8 10*3/uL (ref 1.4–7.0)
Platelets: 279 10*3/uL (ref 150–450)
RBC: 5.05 x10E6/uL (ref 4.14–5.80)
RDW: 13 % (ref 12.3–15.4)
WBC: 7.9 10*3/uL (ref 3.4–10.8)

## 2018-04-03 LAB — HEMOGLOBIN A1C
ESTIMATED AVERAGE GLUCOSE: 114 mg/dL
Hgb A1c MFr Bld: 5.6 % (ref 4.8–5.6)

## 2018-04-03 LAB — PSA: Prostate Specific Ag, Serum: 0.5 ng/mL (ref 0.0–4.0)

## 2018-04-03 LAB — TSH: TSH: 0.967 u[IU]/mL (ref 0.450–4.500)

## 2018-04-03 MED ORDER — ATORVASTATIN CALCIUM 10 MG PO TABS: 10.0000 mg | ORAL_TABLET | Freq: Every day | ORAL | 1 refills | Status: DC

## 2018-04-07 ENCOUNTER — Other Ambulatory Visit: Payer: Self-pay | Admitting: Adult Health

## 2018-04-07 NOTE — Progress Notes (Signed)
Pt refuses statin, prefers TLC

## 2018-05-12 ENCOUNTER — Encounter: Payer: Self-pay | Admitting: Adult Health

## 2018-05-12 ENCOUNTER — Telehealth: Payer: Self-pay | Admitting: Adult Health

## 2018-05-12 NOTE — Telephone Encounter (Signed)
Patient and wife got MyChart message with imaging results [name] ordered and they would like to speak a little more in depth about these results. Please contact them at [phone].  Also patient did sch f/u lab visit for next week as indicated by [facility]

## 2018-05-12 NOTE — Telephone Encounter (Signed)
Pt's wife states that pt has questions regarding low dose CT results.  Advised pt's spouse that pt would need an OV to discuss the findings with [facility].  Pt's wife declined OV at this time.  Charyl Bigger, CMA

## 2018-05-19 ENCOUNTER — Other Ambulatory Visit: Payer: Medicare HMO

## 2018-05-19 DIAGNOSIS — E7800 Pure hypercholesterolemia, unspecified: Secondary | ICD-10-CM

## 2018-05-19 DIAGNOSIS — Z79899 Other long term (current) drug therapy: Secondary | ICD-10-CM | POA: Diagnosis not present

## 2018-05-19 NOTE — Progress Notes (Signed)
Subjective:    Patient ID: Brandon Schultz, male    DOB: 11-22-1950, 68 y.o.   MRN: [id number]  HPI:  Brandon Schultz presents with lumbar back pain that developed three days ago. 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  He denies pain a rest. He reports constipation the last 2 days, but did have small, well formed BM this morning. He denies hematuria/hematochezia He denies abdominal pain He denies numbness/tingling in lower extremities He reports hx og low back pain, "when I over do it" He has hx of known DDD, previously treated by Orthopedic Specialist to include cortisone injections He has been using heating pad and OTC Acetaminophen  Patient Care Team    Relationship Specialty Notifications Start End  Esaw Grandchild, NP PCP - General Family Medicine  05/16/16   Devra Dopp, MD Referring Physician Dermatology  05/16/16     Patient Active Problem List   Diagnosis Date Noted  . Acute bilateral low back pain without sciatica 05/20/2018  . Healthcare maintenance 05/20/2018  . Bronchitis 03/27/2018  . Urinary dribbling 03/27/2018  . History of BPH 03/27/2018  . Urgency of urination 02/24/2018  . Encounter for screening for malignant neoplasm of lung in former smoker who quit in past 15 years with 30 pack year history or greater 02/24/2018  . Colon polyps   . Persistent cough 05/16/2016  . Elevated blood sugar 05/16/2016  . Elevated cholesterol 05/16/2016  . Decreased hearing 03/29/2016  . Routine general medical examination at a health care facility 08/18/2014  . Medicare annual wellness visit, subsequent 08/18/2014  . Sore throat 07/15/2014  . Abdominal pain, unspecified site 02/20/2013  . Encounter to  establish care with new doctor 02/20/2013  . Hemorrhoids 07/08/2012  . Mixed hyperlipidemia 07/28/2008  . DEPRESSION 07/28/2008  . Chronic bronchitis ([place]) 07/28/2008  . GERD 07/28/2008  . Osteoarthritis 07/28/2008  . SYNCOPE AND COLLAPSE 07/28/2008     Past Medical History:  Diagnosis Date  . Allergy    seasonal  . Arthritis   . Asthma   . Colon polyps   . Colon polyps   . COPD (chronic obstructive pulmonary disease) ([place])   . Diverticulitis   . Emphysema   . GERD (gastroesophageal reflux disease)   . Gum disease   . H/O degenerative disc disease   . Hemorrhoids   . Inguinal hernia   . Melanoma ([place])    facial  . Renal disease    Bright's Disease-childhood  . Syncope   . Umbilical hernia   . Urinary tract infection      Past Surgical History:  Procedure Laterality Date  . COLONOSCOPY    . DENTAL SURGERY    . HERNIA REPAIR     Umbilical & Inguinal  . left knee surgery    . MELANOMA EXCISION     facial  . neck and shoulder injection    . POLYPECTOMY    . WISDOM TOOTH EXTRACTION       Family History  Problem Relation Age of Onset  . Colon cancer Mother   . Heart attack Father        Deceased-Early 73s  . Cancer Maternal Grandfather   . Dementia Maternal Grandmother   . Syncope episode Sister   . Healthy Sister  x1  . Healthy Daughter        x2  . Healthy Son        x2  . Colon polyps Neg Hx   . Rectal cancer Neg Hx   . Stomach cancer Neg Hx      Social History   Substance and Sexual Activity  Drug Use No     Social History   Substance and Sexual Activity  Alcohol Use Yes  . Alcohol/week: 14.0 standard drinks  . Types: 14 Cans of beer per week   Comment: 2 beers a day maybe      Social History   Tobacco Use  Smoking Status Former Smoker  . Packs/day: 2.00  . Years: 40.00  . Pack years: 80.00  . Types: Cigarettes  . Last attempt to quit: 04/16/2014  . Years since quitting: 4.0  Smokeless Tobacco Former User      Outpatient Encounter Medications as of 05/20/2018  Medication Sig  . ibuprofen (ADVIL,MOTRIN) 600 MG tablet Take 1 tablet (600 mg total) by mouth every 6 (six) hours as needed.  . latanoprost (XALATAN) 0.005 % ophthalmic solution 1 drop at bedtime.  . Probiotic Product (PROBIOTIC-10) CAPS Take by mouth.  . cyclobenzaprine (FLEXERIL) 10 MG tablet 1 tablet twice daily as needed for muscle spasm  . predniSONE (DELTASONE) 20 MG tablet 1 tab every 12 hrs for 3 days, then one tablet daily for 3 days  . [DISCONTINUED] azithromycin (ZITHROMAX) 250 MG tablet 2 tabs day one. 1 tab day two-five  . [DISCONTINUED] doxycycline (PERIOSTAT) 20 MG tablet Take 20 mg by mouth 2 (two) times daily.   No facility-administered encounter medications on file as of 05/20/2018.     Allergies: Patient has no known allergies.  Body mass index is 31.22 kg/m.  Blood pressure 129/73, pulse 74, temperature 98.4 F (36.9 C), temperature source Oral, height 5' 9.75" (1.772 m), weight 216 lb (98 kg), SpO2 97 %.  Review of Systems  Constitutional: Positive for activity change and fatigue. Negative for appetite change, chills, diaphoresis, fever and unexpected weight change.  Respiratory: Negative for cough, chest tightness, shortness of breath, wheezing and stridor.   Cardiovascular: Negative for chest pain, palpitations and leg swelling.  Gastrointestinal: Positive for constipation. Negative for abdominal distention, abdominal pain, blood in stool, diarrhea, nausea and vomiting.  Endocrine: Negative for cold intolerance, heat intolerance, polydipsia, polyphagia and polyuria.  Genitourinary: Negative for difficulty urinating, flank pain and hematuria.  Musculoskeletal: Positive for arthralgias, back pain and myalgias. Negative for gait problem, joint swelling, neck pain and neck stiffness.  Neurological: Negative for dizziness and headaches.  Hematological: Does not bruise/bleed easily.  Psychiatric/Behavioral:  Positive for sleep disturbance.       Objective:   Physical Exam Vitals signs and nursing note reviewed.  Constitutional:      General: He is not in acute distress.    Appearance: He is not ill-appearing, toxic-appearing or diaphoretic.  HENT:     Head: Normocephalic and atraumatic.  Cardiovascular:     Rate and Rhythm: Normal rate.     Pulses: Normal pulses.     Heart sounds: Normal heart sounds.  Pulmonary:     Effort: Pulmonary effort is normal. No respiratory distress.     Breath sounds: Normal breath sounds. No stridor. No wheezing, rhonchi or rales.  Chest:     Chest wall: No tenderness.  Musculoskeletal:        General: Tenderness present. No swelling, deformity or signs of injury.  Left hip: Normal.     Thoracic back: Normal.     Lumbar back: He exhibits tenderness and spasm. He exhibits normal range of motion, no bony tenderness and no pain.     Right lower leg: No edema.     Left lower leg: No edema.  Skin:    General: Skin is warm and dry.     Capillary Refill: Capillary refill takes less than 2 seconds.  Neurological:     Mental Status: He is alert and oriented to person, place, and time.  Psychiatric:        Mood and Affect: Mood normal.        Behavior: Behavior normal.        Thought Content: Thought content normal.        Judgment: Judgment normal.       Assessment & Plan:   1. Acute bilateral low back pain without sciatica   2. Healthcare maintenance     Acute bilateral low back pain without sciatica 1) Please take Prednisone taper as directed. 2) Please take Cyclobenzaprine as needed. 3) Stretch prior to physical activity and use good body mechanics when performing vigorous activity. 4) OTC Miralax for constipation, increase water intake.   Healthcare maintenance Discussed at length AAA Screening and LDCT results  5) Avoid all tobacco use.  6) Recent lab work as normal. 03/2019- annual physical with fasting labs  Pt was in the office  today for 25+ minutes, I spent >75% of time  in face to face counseling of patient's various medical conditions and in coordination of care FOLLOW-UP:  Return in about 10 months (around 03/20/2019) for Fasting Labs, CPE.

## 2018-05-20 ENCOUNTER — Encounter: Payer: Self-pay | Admitting: Adult Health

## 2018-05-20 ENCOUNTER — Ambulatory Visit (INDEPENDENT_AMBULATORY_CARE_PROVIDER_SITE_OTHER): Payer: Medicare HMO | Admitting: Adult Health

## 2018-05-20 VITALS — BP 129/73 | HR 74 | Temp 98.4°F | Ht 69.75 in | Wt 216.0 lb

## 2018-05-20 DIAGNOSIS — Z0000 Encounter for general adult medical examination without abnormal findings: Secondary | ICD-10-CM | POA: Diagnosis not present

## 2018-05-20 DIAGNOSIS — M545 Low back pain, unspecified: Secondary | ICD-10-CM | POA: Insufficient documentation

## 2018-05-20 LAB — HEPATIC FUNCTION PANEL
ALT: 19 IU/L (ref 0–44)
AST: 17 IU/L (ref 0–40)
Albumin: 4.6 g/dL (ref 3.8–4.8)
Alkaline Phosphatase: 65 IU/L (ref 39–117)
BILIRUBIN, DIRECT: 0.14 mg/dL (ref 0.00–0.40)
Bilirubin Total: 0.7 mg/dL (ref 0.0–1.2)
Total Protein: 6.8 g/dL (ref 6.0–8.5)

## 2018-05-20 MED ORDER — PREDNISONE 20 MG PO TABS: ORAL_TABLET | ORAL | 0 refills | Status: DC

## 2018-05-20 MED ORDER — CYCLOBENZAPRINE HCL 10 MG PO TABS: ORAL_TABLET | ORAL | 0 refills | Status: DC

## 2018-05-20 NOTE — Patient Instructions (Addendum)
Acute Back Pain, Adult Acute back pain is sudden and usually short-lived. It is often caused by an injury to the muscles and tissues in the back. The injury may result from:  A muscle or ligament getting overstretched or torn (strained). Ligaments are tissues that connect bones to each other. Lifting something improperly can cause a back strain.  Wear and tear (degeneration) of the spinal disks. Spinal disks are circular tissue that provides cushioning between the bones of the spine (vertebrae).  Twisting motions, such as while playing sports or doing yard work.  A hit to the back.  Arthritis. You may have a physical exam, lab tests, and imaging tests to find the cause of your pain. Acute back pain usually goes away with rest and home care. Follow these instructions at home: Managing pain, stiffness, and swelling  Take over-the-counter and prescription medicines only as told by your health care provider.  Your health care provider may recommend applying ice during the first 24-48 hours after your pain starts. To do this: ? Put ice in a plastic bag. ? Place a towel between your skin and the bag. ? Leave the ice on for 20 minutes, 2-3 times a day.  If directed, apply heat to the affected area as often as told by your health care provider. Use the heat source that your health care provider recommends, such as a moist heat pack or a heating pad. ? Place a towel between your skin and the heat source. ? Leave the heat on for 20-30 minutes. ? Remove the heat if your skin turns bright red. This is especially important if you are unable to feel pain, heat, or cold. You have a greater risk of getting burned. Activity   Do not stay in bed. Staying in bed for more than 1-2 days can delay your recovery.  Sit up and stand up straight. Avoid leaning forward when you sit, or hunching over when you stand. ? If you work at a desk, sit close to it so you do not need to lean over. Keep your chin tucked  in. Keep your neck drawn back, and keep your elbows bent at a right angle. Your arms should look like the letter "L." ? Sit high and close to the steering wheel when you drive. Add lower back (lumbar) support to your car seat, if needed.  Take short walks on even surfaces as soon as you are able. Try to increase the length of time you walk each day.  Do not sit, drive, or stand in one place for more than 30 minutes at a time. Sitting or standing for long periods of time can put stress on your back.  Do not drive or use heavy machinery while taking prescription pain medicine.  Use proper lifting techniques. When you bend and lift, use positions that put less stress on your back: ? Bend your knees. ? Keep the load close to your body. ? Avoid twisting.  Exercise regularly as told by your health care provider. Exercising helps your back heal faster and helps prevent back injuries by keeping muscles strong and flexible.  Work with a physical therapist to make a safe exercise program, as recommended by your health care provider. Do any exercises as told by your physical therapist. Lifestyle  Maintain a healthy weight. Extra weight puts stress on your back and makes it difficult to have good posture.  Avoid activities or situations that make you feel anxious or stressed. Stress and anxiety increase muscle   tension and can make back pain worse. Learn ways to manage anxiety and stress, such as through exercise. General instructions  Sleep on a firm mattress in a comfortable position. Try lying on your side with your knees slightly bent. If you lie on your back, put a pillow under your knees.  Follow your treatment plan as told by your health care provider. This may include: ? Cognitive or behavioral therapy. ? Acupuncture or massage therapy. ? Meditation or yoga. Contact a health care provider if:  You have pain that is not relieved with rest or medicine.  You have increasing pain going down  into your legs or buttocks.  Your pain does not improve after 2 weeks.  You have pain at night.  You lose weight without trying.  You have a fever or chills. Get help right away if:  You develop new bowel or bladder control problems.  You have unusual weakness or numbness in your arms or legs.  You develop nausea or vomiting.  You develop abdominal pain.  You feel faint. Summary  Acute back pain is sudden and usually short-lived.  Use proper lifting techniques. When you bend and lift, use positions that put less stress on your back.  Take over-the-counter and prescription medicines and apply heat or ice as directed by your health care provider. This information is not intended to replace advice given to you by your health care provider. Make sure you discuss any questions you have with your health care provider. Document Released: 04/02/2005 Document Revised: 11/07/2017 Document Reviewed: 11/14/2016 Elsevier Interactive Patient Education  2019 [place].   1) Please take Prednisone taper as directed. 2) Please take Cyclobenzaprine as needed. 3) Stretch prior to physical activity and use good body mechanics when performing vigorous activity. 4) OTC Miralax for constipation, increase water intake. 5) Avoid all tobacco use. 6) Recent lab work as normal. Follow-up Dec 2020 for physical with fasting labs.

## 2018-05-20 NOTE — Assessment & Plan Note (Signed)
Discussed at length AAA Screening and LDCT results  5) Avoid all tobacco use.  6) Recent lab work as normal. 03/2019- annual physical with fasting labs

## 2018-05-20 NOTE — Assessment & Plan Note (Signed)
1) Please take Prednisone taper as directed. 2) Please take Cyclobenzaprine as needed. 3) Stretch prior to physical activity and use good body mechanics when performing vigorous activity. 4) OTC Miralax for constipation, increase water intake.

## 2019-01-28 DIAGNOSIS — R69 Illness, unspecified: Secondary | ICD-10-CM | POA: Diagnosis not present

## 2019-01-29 DIAGNOSIS — H401131 Primary open-angle glaucoma, bilateral, mild stage: Secondary | ICD-10-CM | POA: Diagnosis not present

## 2019-03-03 DIAGNOSIS — D485 Neoplasm of uncertain behavior of skin: Secondary | ICD-10-CM | POA: Diagnosis not present

## 2019-03-03 DIAGNOSIS — L814 Other melanin hyperpigmentation: Secondary | ICD-10-CM | POA: Diagnosis not present

## 2019-03-03 DIAGNOSIS — L821 Other seborrheic keratosis: Secondary | ICD-10-CM | POA: Diagnosis not present

## 2019-03-03 DIAGNOSIS — D225 Melanocytic nevi of trunk: Secondary | ICD-10-CM | POA: Diagnosis not present

## 2019-03-03 DIAGNOSIS — L905 Scar conditions and fibrosis of skin: Secondary | ICD-10-CM | POA: Diagnosis not present

## 2019-03-03 DIAGNOSIS — C44629 Squamous cell carcinoma of skin of left upper limb, including shoulder: Secondary | ICD-10-CM | POA: Diagnosis not present

## 2019-03-04 NOTE — Progress Notes (Signed)
Subjective:    Patient ID: Brandon Schultz, male    DOB: 03/16/1951, 68 y.o.   MRN: [id number]  HPI:  Brandon Schultz presents with complaints of nocturia 3-4 times/night. He reports occasional urinary frequency a few months ago- none at present. He denies abdominal pain. He denies hematuria/hematochezia. He denies fever/N/V/D. He thinks he has an appt with Urologist in jan 2021- new urgent referral placed today  Needs fasting labs, plus PSA- denies  PSA added to labs- last 48hrs- no motocycle use, intercourse   04/03/2018 LDCT IMPRESSION: 1. Lung-RADS 2, benign appearance or behavior. Continue annual screening with low-dose chest CT without contrast in 12 months. 2. Two-vessel coronary atherosclerosis.  Aortic Atherosclerosis (ICD10-I70.0) and Emphysema (ICD10-J43.9). WILL DISCUSS CARDS REFERRAL AT F/U NEXT MONTH Patient Care Team    Relationship Specialty Notifications Start End  Esaw Grandchild, NP PCP - General Family Medicine  05/16/16   Devra Dopp, MD Referring Physician Dermatology  05/16/16     Patient Active Problem List   Diagnosis Date Noted  . Nocturia 03/05/2019  . Urinary frequency 03/05/2019  . Acute bilateral low back pain without sciatica 05/20/2018  . Healthcare maintenance 05/20/2018  . Bronchitis 03/27/2018  . Urinary dribbling 03/27/2018  . History of BPH 03/27/2018  . Urgency of urination 02/24/2018  . Encounter for screening for malignant neoplasm of lung in former smoker who quit in past 15 years with 30 pack year history or greater 02/24/2018  . Colon polyps   . Persistent cough 05/16/2016  . Elevated blood sugar 05/16/2016  . Elevated cholesterol 05/16/2016  . Decreased hearing 03/29/2016  . Routine general medical examination at a health care facility 08/18/2014  . Medicare annual wellness visit, subsequent 08/18/2014  . Sore throat 07/15/2014  . Abdominal pain, unspecified site 02/20/2013  . Encounter to establish care with new  doctor 02/20/2013  . Hemorrhoids 07/08/2012  . Mixed hyperlipidemia 07/28/2008  . DEPRESSION 07/28/2008  . Chronic bronchitis ([place]) 07/28/2008  . GERD 07/28/2008  . Osteoarthritis 07/28/2008  . SYNCOPE AND COLLAPSE 07/28/2008     Past Medical History:  Diagnosis Date  . Allergy    seasonal  . Arthritis   . Asthma   . Colon polyps   . Colon polyps   . COPD (chronic obstructive pulmonary disease) ([place])   . Diverticulitis   . Emphysema   . GERD (gastroesophageal reflux disease)   . Gum disease   . H/O degenerative disc disease   . Hemorrhoids   . Inguinal hernia   . Melanoma ([place])    facial  . Renal disease    Bright's Disease-childhood  . Syncope   . Umbilical hernia   . Urinary tract infection      Past Surgical History:  Procedure Laterality Date  . COLONOSCOPY    . DENTAL SURGERY    . HERNIA REPAIR     Umbilical & Inguinal  . left knee surgery    . MELANOMA EXCISION     facial  . neck and shoulder injection    . POLYPECTOMY    . WISDOM TOOTH EXTRACTION       Family History  Problem Relation Age of Onset  . Colon cancer Mother   . Heart attack Father        Deceased-Early 57s  . Cancer Maternal Grandfather   . Dementia Maternal Grandmother   . Syncope episode Sister   . Healthy Sister        x1  . Healthy  Daughter        x2  . Healthy Son        x2  . Colon polyps Neg Hx   . Rectal cancer Neg Hx   . Stomach cancer Neg Hx      Social History   Substance and Sexual Activity  Drug Use No     Social History   Substance and Sexual Activity  Alcohol Use Yes  . Alcohol/week: 14.0 standard drinks  . Types: 14 Cans of beer per week   Comment: 2 beers a day maybe      Social History   Tobacco Use  Smoking Status Former Smoker  . Packs/day: 2.00  . Years: 40.00  . Pack years: 80.00  . Types: Cigarettes  . Quit date: 04/16/2014  . Years since quitting: 4.8  Smokeless Tobacco Former User     Outpatient Encounter Medications as of  03/05/2019  Medication Sig  . latanoprost (XALATAN) 0.005 % ophthalmic solution 1 drop at bedtime.  . [DISCONTINUED] cyclobenzaprine (FLEXERIL) 10 MG tablet 1 tablet twice daily as needed for muscle spasm  . [DISCONTINUED] ibuprofen (ADVIL,MOTRIN) 600 MG tablet Take 1 tablet (600 mg total) by mouth every 6 (six) hours as needed.  . [DISCONTINUED] predniSONE (DELTASONE) 20 MG tablet 1 tab every 12 hrs for 3 days, then one tablet daily for 3 days  . [DISCONTINUED] Probiotic Product (PROBIOTIC-10) CAPS Take by mouth.   No facility-administered encounter medications on file as of 03/05/2019.      Allergies: Patient has no known allergies.  Body mass index is 31.76 kg/m.  Blood pressure 124/73, pulse 68, temperature (!) 97.3 F (36.3 C), temperature source Oral, height 5' 9.75" (1.772 m), weight 219 lb 12.8 oz (99.7 kg), SpO2 98 %.  Review of Systems  Constitutional: Positive for fatigue. Negative for activity change, appetite change, chills, diaphoresis, fever and unexpected weight change.  Eyes: Negative for visual disturbance.  Respiratory: Negative for cough, chest tightness, shortness of breath, wheezing and stridor.   Cardiovascular: Negative for chest pain, palpitations and leg swelling.  Gastrointestinal: Negative for abdominal distention, abdominal pain, blood in stool, constipation and nausea.  Endocrine: Negative for polydipsia, polyphagia and polyuria.  Genitourinary: Positive for urgency. Negative for difficulty urinating and flank pain.       Nocturia +  Musculoskeletal: Positive for back pain.  Neurological: Negative for dizziness and headaches.  Hematological: Negative for adenopathy. Does not bruise/bleed easily.       Objective:   Physical Exam Constitutional:      Appearance: Normal appearance.  HENT:     Head: Normocephalic and atraumatic.  Cardiovascular:     Rate and Rhythm: Normal rate and regular rhythm.     Pulses: Normal pulses.     Heart sounds:  Normal heart sounds.  Pulmonary:     Effort: Pulmonary effort is normal. No respiratory distress.     Breath sounds: Normal breath sounds. No stridor. No wheezing, rhonchi or rales.  Chest:     Chest wall: No tenderness.  Skin:    Capillary Refill: Capillary refill takes less than 2 seconds.     Comments: Clubbing of all nails   Neurological:     Mental Status: He is alert and oriented to person, place, and time.  Psychiatric:        Mood and Affect: Mood normal.        Behavior: Behavior normal.        Thought Content: Thought content normal.  Judgment: Judgment normal.           Assessment & Plan:   1. Urinary frequency   2. Elevated cholesterol   3. Mixed hyperlipidemia   4. Elevated blood sugar   5. Healthcare maintenance   6. On statin therapy   7. Urinary dribbling   8. Encounter for screening for malignant neoplasm of respiratory organs   9. Nocturia   10. History of BPH     Nocturia Urgent referral placed to Urology. PSA added to labs- last 48hrs- no motocycle use, intercourse   Mixed hyperlipidemia Lipids drawn   History of BPH Urgent referral to Urology   Urinary frequency UA Blood-Trace Nit- Neg Leu-Neg    FOLLOW-UP:  Return in about 3 weeks (around 03/26/2019) for CPE.

## 2019-03-05 ENCOUNTER — Other Ambulatory Visit: Payer: Self-pay

## 2019-03-05 ENCOUNTER — Encounter: Payer: Self-pay | Admitting: Adult Health

## 2019-03-05 ENCOUNTER — Ambulatory Visit (INDEPENDENT_AMBULATORY_CARE_PROVIDER_SITE_OTHER): Payer: Medicare HMO | Admitting: Adult Health

## 2019-03-05 VITALS — BP 124/73 | HR 68 | Temp 97.3°F | Ht 69.75 in | Wt 219.8 lb

## 2019-03-05 DIAGNOSIS — R739 Hyperglycemia, unspecified: Secondary | ICD-10-CM | POA: Diagnosis not present

## 2019-03-05 DIAGNOSIS — Z87438 Personal history of other diseases of male genital organs: Secondary | ICD-10-CM

## 2019-03-05 DIAGNOSIS — N3943 Post-void dribbling: Secondary | ICD-10-CM

## 2019-03-05 DIAGNOSIS — Z0000 Encounter for general adult medical examination without abnormal findings: Secondary | ICD-10-CM | POA: Diagnosis not present

## 2019-03-05 DIAGNOSIS — Z79899 Other long term (current) drug therapy: Secondary | ICD-10-CM | POA: Diagnosis not present

## 2019-03-05 DIAGNOSIS — R351 Nocturia: Secondary | ICD-10-CM | POA: Diagnosis not present

## 2019-03-05 DIAGNOSIS — E782 Mixed hyperlipidemia: Secondary | ICD-10-CM

## 2019-03-05 DIAGNOSIS — Z122 Encounter for screening for malignant neoplasm of respiratory organs: Secondary | ICD-10-CM

## 2019-03-05 DIAGNOSIS — R350 Frequency of micturition: Secondary | ICD-10-CM | POA: Diagnosis not present

## 2019-03-05 DIAGNOSIS — E7800 Pure hypercholesterolemia, unspecified: Secondary | ICD-10-CM

## 2019-03-05 LAB — POCT URINALYSIS DIPSTICK
Bilirubin, UA: NEGATIVE
Glucose, UA: NEGATIVE
Ketones, UA: NEGATIVE
Leukocytes, UA: NEGATIVE
Nitrite, UA: NEGATIVE
Protein, UA: NEGATIVE
Spec Grav, UA: >=1.03 — AB (ref 1.010–1.025)
Urobilinogen, UA: 0.2 E.U./dL
pH, UA: 6 (ref 5.0–8.0)

## 2019-03-05 NOTE — Assessment & Plan Note (Signed)
Urgent referral placed to Urology. PSA added to labs- last 48hrs- no motocycle use, intercourse

## 2019-03-05 NOTE — Patient Instructions (Signed)
Urinary Frequency, Adult Urinary frequency means urinating more often than usual. You may urinate every 1-2 hours even though you drink a normal amount of fluid and do not have a bladder infection or condition. Although you urinate more often than normal, the total amount of urine produced in a day is normal. With urinary frequency, you may have an urgent need to urinate often. The stress and anxiety of needing to find a bathroom quickly can make this urge worse. This condition may go away on its own or you may need treatment at home. Home treatment may include bladder training, exercises, taking medicines, or making changes to your diet. Follow these instructions at home: Bladder health   Keep a bladder diary if told by your health care provider. Keep track of: ? What you eat and drink. ? How often you urinate. ? How much you urinate.  Follow a bladder training program if told by your health care provider. This may include: ? Learning to delay going to the bathroom. ? Double urinating (voiding). This helps if you are not completely emptying your bladder. ? Scheduled voiding.  Do Kegel exercises as told by your health care provider. Kegel exercises strengthen the muscles that help control urination, which may help the condition. Eating and drinking  If told by your health care provider, make diet changes, such as: ? Avoiding caffeine. ? Drinking fewer fluids, especially alcohol. ? Not drinking in the evening. ? Avoiding foods or drinks that may irritate the bladder. These include coffee, tea, soda, artificial sweeteners, citrus, tomato-based foods, and chocolate. ? Eating foods that help prevent or ease constipation. Constipation can make this condition worse. Your health care provider may recommend that you:  Drink enough fluid to keep your urine pale yellow.  Take over-the-counter or prescription medicines.  Eat foods that are high in fiber, such as beans, whole grains, and fresh  fruits and vegetables.  Limit foods that are high in fat and processed sugars, such as fried or sweet foods. General instructions  Take over-the-counter and prescription medicines only as told by your health care provider.  Keep all follow-up visits as told by your health care provider. This is important. Contact a health care provider if:  You start urinating more often.  You feel pain or irritation when you urinate.  You notice blood in your urine.  Your urine looks cloudy.  You develop a fever.  You begin vomiting. Get help right away if:  You are unable to urinate. Summary  Urinary frequency means urinating more often than usual. With urinary frequency, you may urinate every 1-2 hours even though you drink a normal amount of fluid and do not have a bladder infection or other bladder condition.  Your health care provider may recommend that you keep a bladder diary, follow a bladder training program, or make dietary changes.  If told by your health care provider, do Kegel exercises to strengthen the muscles that help control urination.  Take over-the-counter and prescription medicines only as told by your health care provider.  Contact a health care provider if your symptoms do not improve or get worse. This information is not intended to replace advice given to you by your health care provider. Make sure you discuss any questions you have with your health care provider. Document Released: 01/27/2009 Document Revised: 10/10/2017 Document Reviewed: 10/10/2017 Elsevier Patient Education  2020 [employer].  Urgent referral placed to Urology. We will call you when lab results are available. Low Dose CT  ordered- recommended to repeat annually. Avoid all tobacco use. Continue to social distance and wear a mask when in public. Follow-up 3 weeks for physical. GREAT TO SEE YOU!

## 2019-03-05 NOTE — Assessment & Plan Note (Signed)
Lipids drawn

## 2019-03-05 NOTE — Assessment & Plan Note (Signed)
UA Blood-Trace Nit- Neg Leu-Neg

## 2019-03-05 NOTE — Assessment & Plan Note (Addendum)
Urgent referral to Urology  PSA added to labs- last 48hrs- no motocycle use, intercourse

## 2019-03-05 NOTE — Addendum Note (Signed)
Addended by: Fonnie Mu on: 03/05/2019 10:13 AM   Modules accepted: Orders

## 2019-03-06 LAB — CBC WITH DIFFERENTIAL/PLATELET
Basophils Absolute: 0.1 10*3/uL (ref 0.0–0.2)
Basos: 0 %
EOS (ABSOLUTE): 0.4 10*3/uL (ref 0.0–0.4)
Eos: 4 %
Hematocrit: 46.2 % (ref 37.5–51.0)
Hemoglobin: 15.9 g/dL (ref 13.0–17.7)
Immature Grans (Abs): 0 10*3/uL (ref 0.0–0.1)
Immature Granulocytes: 0 %
Lymphocytes Absolute: 2.9 10*3/uL (ref 0.7–3.1)
Lymphs: 25 %
MCH: 29.3 pg (ref 26.6–33.0)
MCHC: 34.4 g/dL (ref 31.5–35.7)
MCV: 85 fL (ref 79–97)
Monocytes Absolute: 0.8 10*3/uL (ref 0.1–0.9)
Monocytes: 7 %
Neutrophils Absolute: 7.4 10*3/uL — ABNORMAL HIGH (ref 1.4–7.0)
Neutrophils: 64 %
Platelets: 333 10*3/uL (ref 150–450)
RBC: 5.42 x10E6/uL (ref 4.14–5.80)
RDW: 12.6 % (ref 11.6–15.4)
WBC: 11.5 10*3/uL — ABNORMAL HIGH (ref 3.4–10.8)

## 2019-03-06 LAB — LIPID PANEL
Chol/HDL Ratio: 5 ratio (ref 0.0–5.0)
Cholesterol, Total: 222 mg/dL — ABNORMAL HIGH (ref 100–199)
HDL: 44 mg/dL (ref >39)
LDL Chol Calc (NIH): 149 mg/dL — ABNORMAL HIGH (ref 0–99)
Triglycerides: 162 mg/dL — ABNORMAL HIGH (ref 0–149)
VLDL Cholesterol Cal: 29 mg/dL (ref 5–40)

## 2019-03-06 LAB — COMPREHENSIVE METABOLIC PANEL
ALT: 22 IU/L (ref 0–44)
AST: 18 IU/L (ref 0–40)
Albumin/Globulin Ratio: 2 (ref 1.2–2.2)
Albumin: 4.7 g/dL (ref 3.8–4.8)
Alkaline Phosphatase: 63 IU/L (ref 39–117)
BUN/Creatinine Ratio: 13 (ref 10–24)
BUN: 12 mg/dL (ref 8–27)
Bilirubin Total: 0.6 mg/dL (ref 0.0–1.2)
CO2: 23 mmol/L (ref 20–29)
Calcium: 9.5 mg/dL (ref 8.6–10.2)
Chloride: 103 mmol/L (ref 96–106)
Creatinine, Ser: 0.94 mg/dL (ref 0.76–1.27)
GFR calc Af Amer: 97 mL/min/{1.73_m2} (ref >59)
GFR calc non Af Amer: 84 mL/min/{1.73_m2} (ref >59)
Globulin, Total: 2.3 g/dL (ref 1.5–4.5)
Glucose: 98 mg/dL (ref 65–99)
Potassium: 4.5 mmol/L (ref 3.5–5.2)
Sodium: 139 mmol/L (ref 134–144)
Total Protein: 7 g/dL (ref 6.0–8.5)

## 2019-03-06 LAB — HEMOGLOBIN A1C
Est. average glucose Bld gHb Est-mCnc: 111 mg/dL
Hgb A1c MFr Bld: 5.5 % (ref 4.8–5.6)

## 2019-03-06 LAB — TSH: TSH: 1.35 u[IU]/mL (ref 0.450–4.500)

## 2019-03-06 LAB — PSA: Prostate Specific Ag, Serum: 0.5 ng/mL (ref 0.0–4.0)

## 2019-03-19 ENCOUNTER — Other Ambulatory Visit: Payer: Medicare HMO

## 2019-03-25 ENCOUNTER — Ambulatory Visit
Admission: RE | Admit: 2019-03-25 | Discharge: 2019-03-25 | Disposition: A | Discharge status: Home / Self Care | Payer: Medicare HMO | Source: Ambulatory Visit | Attending: Adult Health | Admitting: Adult Health

## 2019-03-25 DIAGNOSIS — R300 Dysuria: Secondary | ICD-10-CM | POA: Diagnosis not present

## 2019-03-25 DIAGNOSIS — N401 Enlarged prostate with lower urinary tract symptoms: Secondary | ICD-10-CM | POA: Diagnosis not present

## 2019-03-25 DIAGNOSIS — Z87891 Personal history of nicotine dependence: Secondary | ICD-10-CM | POA: Diagnosis not present

## 2019-03-25 DIAGNOSIS — Z122 Encounter for screening for malignant neoplasm of respiratory organs: Secondary | ICD-10-CM

## 2019-03-25 DIAGNOSIS — R3911 Hesitancy of micturition: Secondary | ICD-10-CM | POA: Diagnosis not present

## 2019-03-26 ENCOUNTER — Other Ambulatory Visit: Payer: Self-pay

## 2019-03-26 ENCOUNTER — Ambulatory Visit (INDEPENDENT_AMBULATORY_CARE_PROVIDER_SITE_OTHER): Payer: Medicare HMO | Admitting: Adult Health

## 2019-03-26 ENCOUNTER — Encounter: Payer: Self-pay | Admitting: Adult Health

## 2019-03-26 VITALS — BP 124/75 | HR 64 | Temp 98.5°F | Ht 68.75 in | Wt 213.5 lb

## 2019-03-26 DIAGNOSIS — E782 Mixed hyperlipidemia: Secondary | ICD-10-CM

## 2019-03-26 DIAGNOSIS — D72829 Elevated white blood cell count, unspecified: Secondary | ICD-10-CM

## 2019-03-26 DIAGNOSIS — Z0000 Encounter for general adult medical examination without abnormal findings: Secondary | ICD-10-CM | POA: Diagnosis not present

## 2019-03-26 DIAGNOSIS — E7800 Pure hypercholesterolemia, unspecified: Secondary | ICD-10-CM | POA: Diagnosis not present

## 2019-03-26 DIAGNOSIS — R918 Other nonspecific abnormal finding of lung field: Secondary | ICD-10-CM

## 2019-03-26 DIAGNOSIS — Z79899 Other long term (current) drug therapy: Secondary | ICD-10-CM | POA: Diagnosis not present

## 2019-03-26 MED ORDER — ROSUVASTATIN CALCIUM 10 MG PO TABS: ORAL_TABLET | ORAL | 0 refills | Status: DC

## 2019-03-26 NOTE — Progress Notes (Signed)
Subjective:   Brandon Schultz is a 68 y.o. male who presents for Medicare Annual/Subsequent preventive examination.  Review of Systems: General:   Denies fever, chills, unexplained weight loss.  Optho/Auditory:   Denies visual changes, blurred vision/LOV Respiratory:   Denies SOB, DOE more than baseline levels.  Cardiovascular:   Denies chest pain, palpitations, new onset peripheral edema  Gastrointestinal:   Denies nausea, vomiting, diarrhea.  Genitourinary: Denies dysuria, freq/ urgency, flank pain or discharge from genitals.  Endocrine:     Denies hot or cold intolerance, polyuria, polydipsia. Musculoskeletal:   Denies unexplained myalgias, joint swelling, unexplained arthralgias, gait problems.  Skin:  Denies rash, suspicious lesions Neurological:     Denies dizziness, unexplained weakness, numbness  Psychiatric/Behavioral:   Denies mood changes, suicidal or homicidal ideations, hallucinations       Objective:    Vitals: BP 124/75   Pulse 64   Temp 98.5 F (36.9 C) (Oral)   Ht 5' 8.75" (1.746 m)   Wt 213 lb 8 oz (96.8 kg)   SpO2 97% Comment: on RA  BMI 31.76 kg/m   Body mass index is 31.76 kg/m.  Advanced Directives 07/27/2016 07/06/2016  Does Patient Have a Medical Advance Directive? Yes Yes  Type of Paramedic of [place];Living will [place];Living will    Tobacco Social History   Tobacco Use  Smoking Status Former Smoker  . Packs/day: 2.00  . Years: 40.00  . Pack years: 80.00  . Types: Cigarettes  . Quit date: 04/16/2014  . Years since quitting: 4.9  Smokeless Tobacco Former Engineer, structural given: Not Answered   The 10-year ASCVD risk score Brandon Bussing DC Jr., et al., 2013) is: 15.9%   Values used to calculate the score:     Age: 75 years     Sex: Male     Is Non-Hispanic African American: No     Diabetic: No     Tobacco smoker: No     Systolic Blood Pressure: [id number] mmHg     Is BP treated: No     HDL  Cholesterol: 44 mg/dL     Total Cholesterol: 222 mg/dL  LDL-149 LFTs - normal Agreeable to starting statin therapy twice weekly  Past Medical History:  Diagnosis Date  . Allergy    seasonal  . Arthritis   . Asthma   . Colon polyps   . Colon polyps   . COPD (chronic obstructive pulmonary disease) ([place])   . Diverticulitis   . Emphysema   . GERD (gastroesophageal reflux disease)   . Gum disease   . H/O degenerative disc disease   . Hemorrhoids   . Inguinal hernia   . Melanoma ([place])    facial  . Renal disease    Bright's Disease-childhood  . Syncope   . Umbilical hernia   . Urinary tract infection    Past Surgical History:  Procedure Laterality Date  . COLONOSCOPY    . DENTAL SURGERY    . HERNIA REPAIR     Umbilical & Inguinal  . left knee surgery    . MELANOMA EXCISION     facial  . neck and shoulder injection    . POLYPECTOMY    . WISDOM TOOTH EXTRACTION     Family History  Problem Relation Age of Onset  . Colon cancer Mother   . Heart attack Father        Deceased-Early 4s  . Cancer Maternal Grandfather   .  Dementia Maternal Grandmother   . Syncope episode Sister   . Healthy Sister        x1  . Healthy Daughter        x2  . Healthy Son        x2  . Colon polyps Neg Hx   . Rectal cancer Neg Hx   . Stomach cancer Neg Hx    Social History   Socioeconomic History  . Marital status: Married    Spouse name: Not on file  . Number of children: 4  . Years of education: 67  . Highest education level: Not on file  Occupational History  . Occupation: Disability  Tobacco Use  . Smoking status: Former Smoker    Packs/day: 2.00    Years: 40.00    Pack years: 80.00    Types: Cigarettes    Quit date: 04/16/2014    Years since quitting: 4.9  . Smokeless tobacco: Former Network engineer and Sexual Activity  . Alcohol use: Yes    Alcohol/week: 14.0 standard drinks    Types: 14 Cans of beer per week    Comment: 2 beers a day maybe   . Drug use: No  .  Sexual activity: Yes    Birth control/protection: None  Other Topics Concern  . Not on file  Social History Narrative  . Not on file   Social Determinants of Health   Financial Resource Strain:   . Difficulty of Paying Living Expenses: Not on file  Food Insecurity:   . Worried About Charity fundraiser in the Last Year: Not on file  . Ran Out of Food in the Last Year: Not on file  Transportation Needs:   . Lack of Transportation (Medical): Not on file  . Lack of Transportation (Non-Medical): Not on file  Physical Activity:   . Days of Exercise per Week: Not on file  . Minutes of Exercise per Session: Not on file  Stress:   . Feeling of Stress : Not on file  Social Connections:   . Frequency of Communication with Friends and Family: Not on file  . Frequency of Social Gatherings with Friends and Family: Not on file  . Attends Religious Services: Not on file  . Active Member of Clubs or Organizations: Not on file  . Attends Archivist Meetings: Not on file  . Marital Status: Not on file    Outpatient Encounter Medications as of 03/26/2019  Medication Sig  . latanoprost (XALATAN) 0.005 % ophthalmic solution 1 drop at bedtime.  . tamsulosin (FLOMAX) 0.4 MG CAPS capsule Take 0.4 mg by mouth daily.   No facility-administered encounter medications on file as of 03/26/2019.    Activities of Daily Living In your present state of health, do you have any difficulty performing the following activities: 03/26/2019 03/27/2018  Hearing? Brandon Schultz  Vision? Y Y  Difficulty concentrating or making decisions? Y N  Walking or climbing stairs? N Y  Dressing or bathing? N N  Doing errands, shopping? N N  Some recent data might be hidden    Patient Care Team: Brandon Grandchild, NP as PCP - General (Family Medicine) Brandon Schultz, Brandon Bravo, MD as Referring Physician (Dermatology)   Assessment:   This is a routine wellness examination for Brandon Schultz.  Exercise Activities and Dietary  recommendations Low saturated fat diet, remain as active as possible  Fall Risk Fall Risk  03/26/2019 03/27/2018 03/29/2016 08/18/2014  Falls in the past year? 0 0 No  No  Follow up Falls evaluation completed - - -   Is the patient's home free of loose throw rugs in walkways, pet beds, electrical cords, etc?   yes      Grab bars in the bathroom? no      Handrails on the stairs?   yes      Adequate lighting?   yes  Timed Get Up and Go Performed: WNL, 10 seconds   Depression Screen PHQ 2/9 Scores 03/26/2019 03/05/2019 05/20/2018 03/27/2018  PHQ - 2 Score 0 0 0 0  PHQ- 9 Score 3 0 1 1    Cognitive Function-WNL     6CIT Screen 03/26/2019  What Year? 0 points  What month? 0 points  What time? 3 points  Count back from 20 2 points  Months in reverse 4 points  Repeat phrase 4 points  Total Score 13    Immunization History  Administered Date(s) Administered  . Influenza, High Dose Seasonal PF 01/28/2019  . Influenza,inj,quad, With Preservative 01/30/2018  . Influenza-Unspecified 02/24/2016  . Pneumococcal Conjugate-13 02/18/2013, 02/19/2015  . Pneumococcal Polysaccharide-23 04/02/2018  . Tdap 04/16/2016  . Zoster 02/19/2015  . Zoster Recombinat (Shingrix) 04/02/2018, 06/25/2018    Qualifies for Shingles Vaccine? Completed  Screening Tests Health Maintenance  Topic Date Due  . COLONOSCOPY  08/06/2021  . TETANUS/TDAP  04/16/2026  . INFLUENZA VACCINE  Completed  . Hepatitis C Screening  Completed  . PNA vac Low Risk Adult  Completed   Cancer Screenings: Lung: Low Dose CT Chest recommended if Age 25-80 years, 30 pack-year currently smoking OR have quit w/in 15years. Patient does qualify.-Discussed recent results- repeat 03/2020- order placed  Colorectal: UTD, colonoscopy- repeat 5 years  Additional Screenings:  Hepatitis C Screening:  Completed 2017-negative    Plan:  Agreeable to twice weekly statin- started on Rosuvastatin 10mg  take- Wed/Sunday evening. Check  hepatic fx panel/lipids 6 weeks- please schedule fasting lab appt in 6 weeks. Heart healthy diet, increase water intake. Remain as active as possible. CBC re-checked today due to elevated WBC/neutrophils at last check- we will contact you when results are available. Continue with Urologist as directed- recently started on Tamsulosin 0.4mg . Annual medical wellness Regular office visit 6 months Continue to social distance and wear a mask when in public.  I have personally reviewed and noted the following in the patient's chart:   . Medical and social history . Use of alcohol, tobacco or illicit drugs  . Current medications and supplements . Functional ability and status . Nutritional status . Physical activity . Advanced directives . List of other physicians . Hospitalizations, surgeries, and ER visits in previous 12 months . Vitals . Screenings to include cognitive, depression, and falls . Referrals and appointments  In addition, I have reviewed and discussed with patient certain preventive protocols, quality metrics, and best practice recommendations. A written personalized care plan for preventive services as well as general preventive health recommendations were provided to patient.     Jennet Maduro, [place]  03/26/2019

## 2019-03-26 NOTE — Patient Instructions (Addendum)
  Brandon Schultz , Thank you for taking time to come for your Medicare Wellness Visit. I appreciate your ongoing commitment to your health goals. Please review the following plan we discussed and let me know if I can assist you in the future.   These are the goals we discussed: Agreeable to twice weekly statin- started on Rosuvastatin 10mg  take- Wed/Sunday evening. Check hepatic fx panel/lipids 6 weeks- please schedule fasting lab appt in 6 weeks. Heart healthy diet, increase water intake. Remain as active as possible. CBC re-checked today due to elevated WBC/neutrophils at last check- we will contact you when results are available. Continue with Urologist as directed- recently started on Tamsulosin 0.4mg . Annual medical wellness Regular office visit 6 months Continue to social distance and wear a mask when in public.  This is a list of the screening recommended for you and due dates:  Health Maintenance  Topic Date Due  . Colon Cancer Screening  08/06/2021  . Tetanus Vaccine  04/16/2026  . Flu Shot  Completed  .  Hepatitis C: One time screening is recommended by Center for Disease Control  (CDC) for  adults born from [birth year] through 1964.   Completed  . Pneumonia vaccines  Completed

## 2019-03-26 NOTE — Assessment & Plan Note (Signed)
Agreeable to twice weekly statin- started on Rosuvastatin 10mg  take- Wed/Sunday evening. Check hepatic fx panel/lipids 6 weeks- please schedule fasting lab appt in 6 weeks. Heart healthy diet, increase water intake. Remain as active as possible. CBC re-checked today due to elevated WBC/neutrophils at last check- we will contact you when results are available. Continue with Urologist as directed- recently started on Tamsulosin 0.4mg . Annual medical wellness Regular office visit 6 months Continue to social distance and wear a mask when in public.

## 2019-03-27 LAB — CBC WITH DIFFERENTIAL/PLATELET
Basophils Absolute: 0 10*3/uL (ref 0.0–0.2)
Basos: 0 %
EOS (ABSOLUTE): 0.3 10*3/uL (ref 0.0–0.4)
Eos: 3 %
Hematocrit: 46.1 % (ref 37.5–51.0)
Hemoglobin: 16 g/dL (ref 13.0–17.7)
Immature Grans (Abs): 0 10*3/uL (ref 0.0–0.1)
Immature Granulocytes: 0 %
Lymphocytes Absolute: 3 10*3/uL (ref 0.7–3.1)
Lymphs: 25 %
MCH: 30.1 pg (ref 26.6–33.0)
MCHC: 34.7 g/dL (ref 31.5–35.7)
MCV: 87 fL (ref 79–97)
Monocytes Absolute: 0.8 10*3/uL (ref 0.1–0.9)
Monocytes: 7 %
Neutrophils Absolute: 7.7 10*3/uL — ABNORMAL HIGH (ref 1.4–7.0)
Neutrophils: 65 %
Platelets: 288 10*3/uL (ref 150–450)
RBC: 5.32 x10E6/uL (ref 4.14–5.80)
RDW: 12.5 % (ref 11.6–15.4)
WBC: 11.9 10*3/uL — ABNORMAL HIGH (ref 3.4–10.8)

## 2019-03-30 ENCOUNTER — Other Ambulatory Visit: Payer: Self-pay | Admitting: Adult Health

## 2019-03-30 DIAGNOSIS — D72829 Elevated white blood cell count, unspecified: Secondary | ICD-10-CM

## 2019-04-20 DIAGNOSIS — L814 Other melanin hyperpigmentation: Secondary | ICD-10-CM | POA: Diagnosis not present

## 2019-04-20 DIAGNOSIS — Z85828 Personal history of other malignant neoplasm of skin: Secondary | ICD-10-CM | POA: Diagnosis not present

## 2019-04-20 DIAGNOSIS — D229 Melanocytic nevi, unspecified: Secondary | ICD-10-CM | POA: Diagnosis not present

## 2019-04-20 DIAGNOSIS — D1801 Hemangioma of skin and subcutaneous tissue: Secondary | ICD-10-CM | POA: Diagnosis not present

## 2019-04-20 DIAGNOSIS — L821 Other seborrheic keratosis: Secondary | ICD-10-CM | POA: Diagnosis not present

## 2019-04-21 ENCOUNTER — Ambulatory Visit: Payer: Self-pay | Admitting: Urology

## 2019-05-06 ENCOUNTER — Other Ambulatory Visit: Payer: Self-pay

## 2019-05-06 ENCOUNTER — Other Ambulatory Visit: Payer: Medicare HMO

## 2019-05-06 DIAGNOSIS — E7800 Pure hypercholesterolemia, unspecified: Secondary | ICD-10-CM | POA: Diagnosis not present

## 2019-05-06 DIAGNOSIS — E782 Mixed hyperlipidemia: Secondary | ICD-10-CM

## 2019-05-06 DIAGNOSIS — Z79899 Other long term (current) drug therapy: Secondary | ICD-10-CM

## 2019-05-07 LAB — HEPATIC FUNCTION PANEL
ALT: 19 IU/L (ref 0–44)
AST: 16 IU/L (ref 0–40)
Albumin: 4.4 g/dL (ref 3.8–4.8)
Alkaline Phosphatase: 60 IU/L (ref 39–117)
Bilirubin Total: 0.5 mg/dL (ref 0.0–1.2)
Bilirubin, Direct: 0.13 mg/dL (ref 0.00–0.40)
Total Protein: 6.6 g/dL (ref 6.0–8.5)

## 2019-05-07 LAB — LIPID PANEL
Chol/HDL Ratio: 3.7 ratio (ref 0.0–5.0)
Cholesterol, Total: 176 mg/dL (ref 100–199)
HDL: 47 mg/dL (ref >39)
LDL Chol Calc (NIH): 105 mg/dL — ABNORMAL HIGH (ref 0–99)
Triglycerides: 138 mg/dL (ref 0–149)
VLDL Cholesterol Cal: 24 mg/dL (ref 5–40)

## 2019-05-15 ENCOUNTER — Ambulatory Visit: Payer: Medicare HMO

## 2019-05-21 ENCOUNTER — Ambulatory Visit: Payer: Medicare HMO | Attending: Internal Medicine

## 2019-05-21 DIAGNOSIS — Z23 Encounter for immunization: Secondary | ICD-10-CM | POA: Insufficient documentation

## 2019-05-21 NOTE — Progress Notes (Signed)
   Covid-19 Vaccination Clinic  Name:  Brandon Schultz    MRN: [id number] DOB: February 22, 1951  05/21/2019  [name] was observed post Covid-19 immunization for 15 minutes without incidence. He was provided with Vaccine Information Sheet and instruction to access the V-Safe system.   [name] was instructed to call 911 with any severe reactions post vaccine: Marland Kitchen Difficulty breathing  . Swelling of your face and throat  . A fast heartbeat  . A bad rash all over your body  . Dizziness and weakness    Immunizations Administered    Name Date Dose VIS Date Route   Pfizer COVID-19 Vaccine 05/21/2019  8:23 AM 0.3 mL 03/27/2019 Intramuscular   Manufacturer: [place]   Lot: [id number]   [place]: [id number]

## 2019-05-26 ENCOUNTER — Ambulatory Visit: Payer: Medicare HMO

## 2019-06-15 ENCOUNTER — Ambulatory Visit: Payer: Medicare HMO | Attending: Internal Medicine

## 2019-06-15 DIAGNOSIS — Z23 Encounter for immunization: Secondary | ICD-10-CM

## 2019-06-15 NOTE — Progress Notes (Signed)
   Covid-19 Vaccination Clinic  Name:  DARNELLE LOEWY    MRN: [id number] DOB: 06-02-1950  06/15/2019  Mr. Lappe was observed post Covid-19 immunization for 15 minutes without incidence. He was provided with Vaccine Information Sheet and instruction to access the V-Safe system.   Mr. Takemura was instructed to call 911 with any severe reactions post vaccine: Marland Kitchen Difficulty breathing  . Swelling of your face and throat  . A fast heartbeat  . A bad rash all over your body  . Dizziness and weakness    Immunizations Administered    Name Date Dose VIS Date Route   Pfizer COVID-19 Vaccine 06/15/2019 12:43 PM 0.3 mL 03/27/2019 Intramuscular   Manufacturer: [place]   Lot: [id number]   [place]: [id number]

## 2020-01-04 ENCOUNTER — Other Ambulatory Visit: Payer: Self-pay | Admitting: Adult Health

## 2020-01-18 DIAGNOSIS — R69 Illness, unspecified: Secondary | ICD-10-CM | POA: Diagnosis not present

## 2020-01-26 ENCOUNTER — Telehealth: Payer: Self-pay | Admitting: Physician Assistant

## 2020-01-26 ENCOUNTER — Other Ambulatory Visit: Payer: Self-pay | Admitting: Physician Assistant

## 2020-01-26 MED ORDER — ROSUVASTATIN CALCIUM 10 MG PO TABS: ORAL_TABLET | ORAL | 0 refills | Status: DC

## 2020-01-26 NOTE — Telephone Encounter (Signed)
Please call patient to schedule apt for further refills. AS< CMA

## 2020-02-15 DIAGNOSIS — R69 Illness, unspecified: Secondary | ICD-10-CM | POA: Diagnosis not present

## 2020-04-19 DIAGNOSIS — Z85828 Personal history of other malignant neoplasm of skin: Secondary | ICD-10-CM | POA: Diagnosis not present

## 2020-04-19 DIAGNOSIS — L819 Disorder of pigmentation, unspecified: Secondary | ICD-10-CM | POA: Diagnosis not present

## 2020-04-19 DIAGNOSIS — L814 Other melanin hyperpigmentation: Secondary | ICD-10-CM | POA: Diagnosis not present

## 2020-04-19 DIAGNOSIS — L821 Other seborrheic keratosis: Secondary | ICD-10-CM | POA: Diagnosis not present

## 2020-04-19 DIAGNOSIS — D225 Melanocytic nevi of trunk: Secondary | ICD-10-CM | POA: Diagnosis not present

## 2020-04-19 DIAGNOSIS — L905 Scar conditions and fibrosis of skin: Secondary | ICD-10-CM | POA: Diagnosis not present

## 2020-04-19 DIAGNOSIS — L820 Inflamed seborrheic keratosis: Secondary | ICD-10-CM | POA: Diagnosis not present

## 2020-05-09 ENCOUNTER — Telehealth: Payer: Self-pay | Admitting: Physician Assistant

## 2020-05-09 ENCOUNTER — Other Ambulatory Visit: Payer: Self-pay | Admitting: Physician Assistant

## 2020-05-09 DIAGNOSIS — E7800 Pure hypercholesterolemia, unspecified: Secondary | ICD-10-CM

## 2020-05-09 DIAGNOSIS — E782 Mixed hyperlipidemia: Secondary | ICD-10-CM

## 2020-05-09 DIAGNOSIS — Z0000 Encounter for general adult medical examination without abnormal findings: Secondary | ICD-10-CM

## 2020-05-09 DIAGNOSIS — R739 Hyperglycemia, unspecified: Secondary | ICD-10-CM

## 2020-05-09 NOTE — Telephone Encounter (Signed)
Please contact patient to schedule apt for med refills per last AVS. AS, CMA

## 2020-05-09 NOTE — Telephone Encounter (Signed)
Patient is scheduled for a MWW. Spoke with wife earlier and they stated they have their labs and med refills done at [facility].

## 2020-05-09 NOTE — Telephone Encounter (Signed)
Patient's wife called in requesting a referral for chest ct. Thanks. She did not mention where.

## 2020-05-09 NOTE — Telephone Encounter (Signed)
Why does patient need chest ct? Patient has apt with Herb Grays in February and has never seen patient.

## 2020-05-09 NOTE — Telephone Encounter (Signed)
Chest ct is for emphysema. Former doctor scheduled a yearly one according to patient's wife.

## 2020-05-09 NOTE — Telephone Encounter (Signed)
Spoke with pts wife Otila Kluver and advised that we would like to collect labs on patient before MW so Herb Grays can review labs with both patients. Otila Kluver was agreeable once she was explained why this needs to be done. Lab apt scheduled for both patients.  Also, advised Otila Kluver to speak with Herb Grays about Chest CT at [facility] since no acute issues are happening. Otila Kluver was agreeable. AS, CMA

## 2020-05-23 ENCOUNTER — Other Ambulatory Visit: Payer: Self-pay | Admitting: Physician Assistant

## 2020-05-23 DIAGNOSIS — Z0000 Encounter for general adult medical examination without abnormal findings: Secondary | ICD-10-CM

## 2020-05-23 DIAGNOSIS — Z87438 Personal history of other diseases of male genital organs: Secondary | ICD-10-CM

## 2020-05-23 DIAGNOSIS — E782 Mixed hyperlipidemia: Secondary | ICD-10-CM

## 2020-05-23 DIAGNOSIS — E7800 Pure hypercholesterolemia, unspecified: Secondary | ICD-10-CM

## 2020-05-27 ENCOUNTER — Other Ambulatory Visit: Payer: Medicare HMO

## 2020-05-27 ENCOUNTER — Other Ambulatory Visit: Payer: Self-pay

## 2020-05-27 DIAGNOSIS — Z0000 Encounter for general adult medical examination without abnormal findings: Secondary | ICD-10-CM | POA: Diagnosis not present

## 2020-05-27 DIAGNOSIS — Z87438 Personal history of other diseases of male genital organs: Secondary | ICD-10-CM

## 2020-05-27 DIAGNOSIS — E7800 Pure hypercholesterolemia, unspecified: Secondary | ICD-10-CM

## 2020-05-27 DIAGNOSIS — E782 Mixed hyperlipidemia: Secondary | ICD-10-CM

## 2020-05-28 LAB — CBC
Hematocrit: 43.6 % (ref 37.5–51.0)
Hemoglobin: 14.8 g/dL (ref 13.0–17.7)
MCH: 29.6 pg (ref 26.6–33.0)
MCHC: 33.9 g/dL (ref 31.5–35.7)
MCV: 87 fL (ref 79–97)
Platelets: 270 10*3/uL (ref 150–450)
RBC: 5 x10E6/uL (ref 4.14–5.80)
RDW: 12.6 % (ref 11.6–15.4)
WBC: 7.1 10*3/uL (ref 3.4–10.8)

## 2020-05-28 LAB — COMPREHENSIVE METABOLIC PANEL
ALT: 14 IU/L (ref 0–44)
AST: 14 IU/L (ref 0–40)
Albumin/Globulin Ratio: 2.1 (ref 1.2–2.2)
Albumin: 4.6 g/dL (ref 3.8–4.8)
Alkaline Phosphatase: 53 IU/L (ref 44–121)
BUN/Creatinine Ratio: 17 (ref 10–24)
BUN: 16 mg/dL (ref 8–27)
Bilirubin Total: 0.5 mg/dL (ref 0.0–1.2)
CO2: 21 mmol/L (ref 20–29)
Calcium: 9.5 mg/dL (ref 8.6–10.2)
Chloride: 102 mmol/L (ref 96–106)
Creatinine, Ser: 0.93 mg/dL (ref 0.76–1.27)
GFR calc Af Amer: 97 mL/min/{1.73_m2} (ref >59)
GFR calc non Af Amer: 84 mL/min/{1.73_m2} (ref >59)
Globulin, Total: 2.2 g/dL (ref 1.5–4.5)
Glucose: 96 mg/dL (ref 65–99)
Potassium: 4.2 mmol/L (ref 3.5–5.2)
Sodium: 140 mmol/L (ref 134–144)
Total Protein: 6.8 g/dL (ref 6.0–8.5)

## 2020-05-28 LAB — LIPID PANEL
Chol/HDL Ratio: 3.4 ratio (ref 0.0–5.0)
Cholesterol, Total: 150 mg/dL (ref 100–199)
HDL: 44 mg/dL (ref >39)
LDL Chol Calc (NIH): 89 mg/dL (ref 0–99)
Triglycerides: 89 mg/dL (ref 0–149)
VLDL Cholesterol Cal: 17 mg/dL (ref 5–40)

## 2020-05-28 LAB — TSH: TSH: 0.482 u[IU]/mL (ref 0.450–4.500)

## 2020-05-28 LAB — PSA: Prostate Specific Ag, Serum: 0.5 ng/mL (ref 0.0–4.0)

## 2020-05-28 LAB — HEMOGLOBIN A1C
Est. average glucose Bld gHb Est-mCnc: 105 mg/dL
Hgb A1c MFr Bld: 5.3 % (ref 4.8–5.6)

## 2020-06-03 ENCOUNTER — Encounter: Payer: Self-pay | Admitting: Physician Assistant

## 2020-06-03 ENCOUNTER — Ambulatory Visit (INDEPENDENT_AMBULATORY_CARE_PROVIDER_SITE_OTHER): Payer: Medicare HMO | Admitting: Physician Assistant

## 2020-06-03 ENCOUNTER — Other Ambulatory Visit: Payer: Self-pay

## 2020-06-03 VITALS — BP 133/74 | HR 70 | Temp 97.3°F | Ht 68.75 in | Wt 220.7 lb

## 2020-06-03 DIAGNOSIS — R69 Illness, unspecified: Secondary | ICD-10-CM | POA: Diagnosis not present

## 2020-06-03 DIAGNOSIS — Z0000 Encounter for general adult medical examination without abnormal findings: Secondary | ICD-10-CM | POA: Diagnosis not present

## 2020-06-03 DIAGNOSIS — E782 Mixed hyperlipidemia: Secondary | ICD-10-CM | POA: Diagnosis not present

## 2020-06-03 DIAGNOSIS — F17210 Nicotine dependence, cigarettes, uncomplicated: Secondary | ICD-10-CM | POA: Diagnosis not present

## 2020-06-03 NOTE — Patient Instructions (Signed)
Preventive Care 69 Years and Older, Male Preventive care refers to lifestyle choices and visits with your health care provider that can promote health and wellness. This includes:  A yearly physical exam. This is also called an annual wellness visit.  Regular dental and eye exams.  Immunizations.  Screening for certain conditions.  Healthy lifestyle choices, such as: ? Eating a healthy diet. ? Getting regular exercise. ? Not using drugs or products that contain nicotine and tobacco. ? Limiting alcohol use. What can I expect for my preventive care visit? Physical exam Your health care provider will check your:  Height and weight. These may be used to calculate your BMI (body mass index). BMI is a measurement that tells if you are at a healthy weight.  Heart rate and blood pressure.  Body temperature.  Skin for abnormal spots. Counseling Your health care provider may ask you questions about your:  Past medical problems.  Family's medical history.  Alcohol, tobacco, and drug use.  Emotional well-being.  Home life and relationship well-being.  Sexual activity.  Diet, exercise, and sleep habits.  History of falls.  Memory and ability to understand (cognition).  Work and work environment.  Access to firearms. What immunizations do I need? Vaccines are usually given at various ages, according to a schedule. Your health care provider will recommend vaccines for you based on your age, medical history, and lifestyle or other factors, such as travel or where you work.   What tests do I need? Blood tests  Lipid and cholesterol levels. These may be checked every 5 years, or more often depending on your overall health.  Hepatitis C test.  Hepatitis B test. Screening  Lung cancer screening. You may have this screening every year starting at age 55 if you have a 30-pack-year history of smoking and currently smoke or have quit within the past 15 years.  Colorectal  cancer screening. ? All adults should have this screening starting at age 50 and continuing until age 75. ? Your health care provider may recommend screening at age 45 if you are at increased risk. ? You will have tests every 1-10 years, depending on your results and the type of screening test.  Prostate cancer screening. Recommendations will vary depending on your family history and other risks.  Genital exam to check for testicular cancer or hernias.  Diabetes screening. ? This is done by checking your blood sugar (glucose) after you have not eaten for a while (fasting). ? You may have this done every 1-3 years.  Abdominal aortic aneurysm (AAA) screening. You may need this if you are a current or former smoker.  STD (sexually transmitted disease) testing, if you are at risk. Follow these instructions at home: Eating and drinking  Eat a diet that includes fresh fruits and vegetables, whole grains, lean protein, and low-fat dairy products. Limit your intake of foods with high amounts of sugar, saturated fats, and salt.  Take vitamin and mineral supplements as recommended by your health care provider.  Do not drink alcohol if your health care provider tells you not to drink.  If you drink alcohol: ? Limit how much you have to 0-2 drinks a day. ? Be aware of how much alcohol is in your drink. In the [place] one drink equals one 12 oz bottle of beer (355 mL), one 5 oz glass of wine (148 mL), or one 1 oz glass of hard liquor (44 mL).   Lifestyle  Take daily care of your teeth   and gums. Brush your teeth every morning and night with fluoride toothpaste. Floss one time each day.  Stay active. Exercise for at least 30 minutes 5 or more days each week.  Do not use any products that contain nicotine or tobacco, such as cigarettes, e-cigarettes, and chewing tobacco. If you need help quitting, ask your health care provider.  Do not use drugs.  If you are sexually active, practice safe sex.  Use a condom or other form of protection to prevent STIs (sexually transmitted infections).  Talk with your health care provider about taking a low-dose aspirin or statin.  Find healthy ways to cope with stress, such as: ? Meditation, yoga, or listening to music. ? Journaling. ? Talking to a trusted person. ? Spending time with friends and family. Safety  Always wear your seat belt while driving or riding in a vehicle.  Do not drive: ? If you have been drinking alcohol. Do not ride with someone who has been drinking. ? When you are tired or distracted. ? While texting.  Wear a helmet and other protective equipment during sports activities.  If you have firearms in your house, make sure you follow all gun safety procedures. What's next?  Visit your health care provider once a year for an annual wellness visit.  Ask your health care provider how often you should have your eyes and teeth checked.  Stay up to date on all vaccines. This information is not intended to replace advice given to you by your health care provider. Make sure you discuss any questions you have with your health care provider. Document Revised: 12/30/2018 Document Reviewed: 03/27/2018 Elsevier Patient Education  2021 Elsevier Inc.  

## 2020-06-03 NOTE — Progress Notes (Signed)
Subjective:   Brandon Schultz is a 70 y.o. male who presents for Medicare Annual/Subsequent preventive examination.  Review of Systems    General:   No F/C, wt loss Pulm:   No DIB, SOB, pleuritic chest pain Card:  No CP, palpitations Abd:  No n/v/d or pain Ext:  No inc edema from baseline  Objective:    Today's Vitals   06/03/20 0914  BP: 133/74  Pulse: 70  Temp: (!) 97.3 F (36.3 C)  SpO2: 98%  Weight: 220 lb 11.2 oz (100.1 kg)  Height: 5' 8.75" (1.746 m)   Body mass index is 32.83 kg/m.  Advanced Directives 07/27/2016 07/06/2016  Does Patient Have a Medical Advance Directive? Yes Yes  Type of Paramedic of [place];Living will [place];Living will    Current Medications (verified) Outpatient Encounter Medications as of 06/03/2020  Medication Sig  . latanoprost (XALATAN) 0.005 % ophthalmic solution 1 drop at bedtime.  . rosuvastatin (CRESTOR) 10 MG tablet Take 1 tablet (10 mg total) by mouth 2 (two) times a week. TAKE 1 TABLET BY MOUTH TWICE A WEEK ON WEDNESDAY AND SUNDAY EVENING**NEEDS APT FOR REFILLS**  . tamsulosin (FLOMAX) 0.4 MG CAPS capsule Take 0.4 mg by mouth daily.   No facility-administered encounter medications on file as of 06/03/2020.    Allergies (verified) Patient has no known allergies.   History: Past Medical History:  Diagnosis Date  . Allergy    seasonal  . Arthritis   . Asthma   . Cataract    Phreesia 05/31/2020  . Chronic kidney disease    Phreesia 05/31/2020  . Colon polyps   . Colon polyps   . COPD (chronic obstructive pulmonary disease) ([place])   . Diverticulitis   . Emphysema   . Emphysema of lung ([place])    Phreesia 05/31/2020  . GERD (gastroesophageal reflux disease)   . Glaucoma    Phreesia 05/31/2020  . Gum disease   . H/O degenerative disc disease   . Hemorrhoids   . Hyperlipidemia    Phreesia 05/31/2020  . Inguinal hernia   . Melanoma ([place])    facial  . Renal disease     Bright's Disease-childhood  . Syncope   . Umbilical hernia   . Urinary tract infection    Past Surgical History:  Procedure Laterality Date  . COLONOSCOPY    . DENTAL SURGERY    . HERNIA REPAIR     Umbilical & Inguinal  . left knee surgery    . MELANOMA EXCISION     facial  . neck and shoulder injection    . POLYPECTOMY    . WISDOM TOOTH EXTRACTION     Family History  Problem Relation Age of Onset  . Colon cancer Mother   . Heart attack Father        Deceased-Early 15s  . Cancer Maternal Grandfather   . Dementia Maternal Grandmother   . Syncope episode Sister   . Healthy Sister        x1  . Healthy Daughter        x2  . Healthy Son        x2  . Colon polyps Neg Hx   . Rectal cancer Neg Hx   . Stomach cancer Neg Hx    Social History   Socioeconomic History  . Marital status: Married    Spouse name: Not on file  . Number of children: 4  . Years of education: 24  .  Highest education level: Not on file  Occupational History  . Occupation: Disability  Tobacco Use  . Smoking status: Former Smoker    Packs/day: 2.00    Years: 40.00    Pack years: 80.00    Types: Cigarettes    Quit date: 04/16/2014    Years since quitting: 6.1  . Smokeless tobacco: Former Network engineer and Sexual Activity  . Alcohol use: Yes    Alcohol/week: 14.0 standard drinks    Types: 14 Cans of beer per week    Comment: 2 beers a day maybe   . Drug use: No  . Sexual activity: Yes    Birth control/protection: None  Other Topics Concern  . Not on file  Social History Narrative  . Not on file   Social Determinants of Health   Financial Resource Strain: Not on file  Food Insecurity: Not on file  Transportation Needs: Not on file  Physical Activity: Not on file  Stress: Not on file  Social Connections: Not on file    Tobacco Counseling Counseling given: Not Answered    Diabetic? no         Activities of Daily Living In your present state of health, do you have any  difficulty performing the following activities: 06/03/2020  Hearing? Y  Vision? Y  Difficulty concentrating or making decisions? N  Walking or climbing stairs? N  Dressing or bathing? N  Doing errands, shopping? N  Some recent data might be hidden    Patient Care Team: Lorrene Reid, PA-C as PCP - General (Physician Assistant) Renda Rolls, Jennefer Bravo, MD as Referring Physician (Dermatology)  Indicate any recent Medical Services you may have received from other than [facility] providers in the past year (date may be approximate).     Assessment:   This is a routine wellness examination for [place].  Hearing/Vision screen No exam data present  Dietary issues and exercise activities discussed: -Recommend to follow a heart healthy diet and stay as active as possible.   Goals   None    Depression Screen PHQ 2/9 Scores 06/03/2020 03/26/2019 03/05/2019 05/20/2018 03/27/2018 02/24/2018 03/29/2016  PHQ - 2 Score 0 0 0 0 0 0 0  PHQ- 9 Score 0 3 0 1 1 1  -    Fall Risk Fall Risk  06/03/2020 03/26/2019 03/27/2018 03/29/2016 08/18/2014  Falls in the past year? 1 0 0 No No  Number falls in past yr: 0 - - - -  Injury with Fall? 0 - - - -  Follow up Falls evaluation completed Falls evaluation completed - - -    FALL RISK PREVENTION PERTAINING TO THE HOME:  Any stairs in or around the home? Yes  If so, are there any without handrails? Yes  Home free of loose throw rugs in walkways, pet beds, electrical cords, etc? No  Adequate lighting in your home to reduce risk of falls? Yes   ASSISTIVE DEVICES UTILIZED TO PREVENT FALLS:  Life alert? No  Use of a cane, walker or w/c? No  Grab bars in the bathroom? No  Shower chair or bench in shower? No  Elevated toilet seat or a handicapped toilet? No   TIMED UP AND GO:  Was the test performed? Yes .  Length of time to ambulate 10 feet: 8 sec.   Gait steady and fast without use of assistive device  Cognitive Function: stable     6CIT Screen  06/03/2020 03/26/2019  What Year? 4 points 0 points  What month?  0 points 0 points  What time? 0 points 3 points  Count back from 20 0 points 2 points  Months in reverse 4 points 4 points  Repeat phrase 0 points 4 points  Total Score 8 13    Immunizations Immunization History  Administered Date(s) Administered  . Influenza, High Dose Seasonal PF 01/28/2019  . Influenza,inj,quad, With Preservative 01/30/2018  . Influenza-Unspecified 02/24/2016, 01/20/2020  . PFIZER(Purple Top)SARS-COV-2 Vaccination 05/21/2019, 06/15/2019, 02/02/2020  . Pneumococcal Conjugate-13 02/18/2013, 02/19/2015  . Pneumococcal Polysaccharide-23 04/02/2018  . Tdap 04/16/2016  . Zoster 02/19/2015  . Zoster Recombinat (Shingrix) 04/02/2018, 06/25/2018    TDAP status: Up to date  Flu Vaccine status: Up to date  Pneumococcal vaccine status: Up to date  Covid-19 vaccine status: Completed vaccines  Qualifies for Shingles Vaccine? Yes   Zostavax completed Yes   Shingrix Completed?: Yes  Screening Tests Health Maintenance  Topic Date Due  . COLONOSCOPY (Pts 45-38yrs Insurance coverage will need to be confirmed)  08/06/2021  . TETANUS/TDAP  04/16/2026  . INFLUENZA VACCINE  Completed  . COVID-19 Vaccine  Completed  . Hepatitis C Screening  Completed  . PNA vac Low Risk Adult  Completed    Health Maintenance  There are no preventive care reminders to display for this patient.  Colorectal cancer screening: Type of screening: Colonoscopy. Completed 5. Repeat every 5 years  Lung Cancer Screening: (Low Dose CT Chest recommended if Age 30-80 years, 30 pack-year currently smoking OR have quit w/in 15years.) does qualify.   Lung Cancer Screening Referral:   Additional Screening:  Hepatitis C Screening: does qualify; Completed 03/29/16  Vision Screening: Recommended annual ophthalmology exams for early detection of glaucoma and other disorders of the eye. Is the patient up to date with their annual eye  exam?  Yes  Who is the provider or what is the name of the office in which the patient attends annual eye exams?  If pt is not established with a provider, would they like to be referred to a provider to establish care? No .   Dental Screening: Recommended annual dental exams for proper oral hygiene  Community Resource Referral / Chronic Care Management: CRR required this visit?  No   CCM required this visit?  No      Plan:  -Continue current medication regimen. -Discussed most recent lab results which are all essentially within normal limits.  -Stay well hydrated. -Follow up in 1 year for [facility] and [facility] or sooner if needed.  I have personally reviewed and noted the following in the patient's chart:   . Medical and social history . Use of alcohol, tobacco or illicit drugs  . Current medications and supplements . Functional ability and status . Nutritional status . Physical activity . Advanced directives . List of other physicians . Hospitalizations, surgeries, and ER visits in previous 12 months . Vitals . Screenings to include cognitive, depression, and falls . Referrals and appointments  In addition, I have reviewed and discussed with patient certain preventive protocols, quality metrics, and best practice recommendations. A written personalized care plan for preventive services as well as general preventive health recommendations were provided to patient.

## 2020-06-30 ENCOUNTER — Other Ambulatory Visit: Payer: Self-pay | Admitting: Physician Assistant

## 2020-08-29 DIAGNOSIS — H401131 Primary open-angle glaucoma, bilateral, mild stage: Secondary | ICD-10-CM | POA: Diagnosis not present

## 2020-10-18 ENCOUNTER — Other Ambulatory Visit: Payer: Self-pay | Admitting: Physician Assistant

## 2020-10-18 MED ORDER — ROSUVASTATIN CALCIUM 10 MG PO TABS: 10.0000 mg | ORAL_TABLET | ORAL | 0 refills | Status: DC

## 2020-11-14 HISTORY — PX: OTHER SURGICAL HISTORY: SHX169

## 2020-11-28 DIAGNOSIS — H2513 Age-related nuclear cataract, bilateral: Secondary | ICD-10-CM | POA: Diagnosis not present

## 2020-11-28 DIAGNOSIS — H401131 Primary open-angle glaucoma, bilateral, mild stage: Secondary | ICD-10-CM | POA: Diagnosis not present

## 2020-11-28 DIAGNOSIS — H43813 Vitreous degeneration, bilateral: Secondary | ICD-10-CM | POA: Diagnosis not present

## 2020-11-30 DIAGNOSIS — Z20822 Contact with and (suspected) exposure to covid-19: Secondary | ICD-10-CM | POA: Diagnosis not present

## 2020-12-03 DIAGNOSIS — Z20822 Contact with and (suspected) exposure to covid-19: Secondary | ICD-10-CM | POA: Diagnosis not present

## 2020-12-03 DIAGNOSIS — Z6830 Body mass index (BMI) 30.0-30.9, adult: Secondary | ICD-10-CM | POA: Diagnosis not present

## 2020-12-05 DIAGNOSIS — H401111 Primary open-angle glaucoma, right eye, mild stage: Secondary | ICD-10-CM | POA: Diagnosis not present

## 2020-12-05 DIAGNOSIS — Z79899 Other long term (current) drug therapy: Secondary | ICD-10-CM | POA: Diagnosis not present

## 2020-12-05 DIAGNOSIS — E7800 Pure hypercholesterolemia, unspecified: Secondary | ICD-10-CM | POA: Diagnosis not present

## 2020-12-05 DIAGNOSIS — H2511 Age-related nuclear cataract, right eye: Secondary | ICD-10-CM | POA: Diagnosis not present

## 2020-12-05 DIAGNOSIS — H401131 Primary open-angle glaucoma, bilateral, mild stage: Secondary | ICD-10-CM | POA: Diagnosis not present

## 2020-12-07 DIAGNOSIS — H401121 Primary open-angle glaucoma, left eye, mild stage: Secondary | ICD-10-CM | POA: Diagnosis not present

## 2020-12-07 DIAGNOSIS — H401132 Primary open-angle glaucoma, bilateral, moderate stage: Secondary | ICD-10-CM | POA: Diagnosis not present

## 2020-12-07 DIAGNOSIS — H2512 Age-related nuclear cataract, left eye: Secondary | ICD-10-CM | POA: Diagnosis not present

## 2021-01-10 ENCOUNTER — Other Ambulatory Visit: Payer: Self-pay

## 2021-01-10 DIAGNOSIS — E782 Mixed hyperlipidemia: Secondary | ICD-10-CM

## 2021-01-10 MED ORDER — ROSUVASTATIN CALCIUM 10 MG PO TABS: 10.0000 mg | ORAL_TABLET | ORAL | 0 refills | Status: DC

## 2021-02-06 DIAGNOSIS — H401131 Primary open-angle glaucoma, bilateral, mild stage: Secondary | ICD-10-CM | POA: Diagnosis not present

## 2021-02-20 ENCOUNTER — Other Ambulatory Visit: Payer: Self-pay

## 2021-02-20 ENCOUNTER — Encounter: Payer: Self-pay | Admitting: Physician Assistant

## 2021-02-20 ENCOUNTER — Ambulatory Visit (INDEPENDENT_AMBULATORY_CARE_PROVIDER_SITE_OTHER): Payer: Medicare HMO | Admitting: Physician Assistant

## 2021-02-20 VITALS — BP 114/72 | HR 69 | Temp 98.2°F | Ht 69.0 in | Wt 210.0 lb

## 2021-02-20 DIAGNOSIS — K649 Unspecified hemorrhoids: Secondary | ICD-10-CM | POA: Diagnosis not present

## 2021-02-20 DIAGNOSIS — F17210 Nicotine dependence, cigarettes, uncomplicated: Secondary | ICD-10-CM

## 2021-02-20 DIAGNOSIS — R490 Dysphonia: Secondary | ICD-10-CM

## 2021-02-20 DIAGNOSIS — R69 Illness, unspecified: Secondary | ICD-10-CM | POA: Diagnosis not present

## 2021-02-20 DIAGNOSIS — R194 Change in bowel habit: Secondary | ICD-10-CM

## 2021-02-20 DIAGNOSIS — K219 Gastro-esophageal reflux disease without esophagitis: Secondary | ICD-10-CM | POA: Diagnosis not present

## 2021-02-20 DIAGNOSIS — R61 Generalized hyperhidrosis: Secondary | ICD-10-CM | POA: Diagnosis not present

## 2021-02-20 DIAGNOSIS — R109 Unspecified abdominal pain: Secondary | ICD-10-CM

## 2021-02-20 MED ORDER — PANTOPRAZOLE SODIUM 20 MG PO TBEC: 20.0000 mg | DELAYED_RELEASE_TABLET | Freq: Every day | ORAL | 2 refills | Status: DC

## 2021-02-20 NOTE — Progress Notes (Signed)
Established Patient Office Visit  Subjective:  Patient ID: Brandon Schultz, male    DOB: 02/12/1951  Age: 70 y.o. MRN: [id number]  CC:  Chief Complaint  Patient presents with   Hemorrhoids    HPI [name] presents for multiple c/o including bleeding hemorrhoids, hoarseness, indigestion and altered bowel habits. Patient states in the past used to have constipation but most recently has about 2-3 bowel movements daily and cannot clean himself good without having to wipe himself multiple times. Patient does report having to strain. Patient states indigestion is intermittent and pain is mostly left sided. Unsure of when pain started. Also reports nausea in the morning after breakfast for a couple of months. Has noticed some night sweats the past few weeks. Drinks 1 beer or cocktail drink per day. Denies dysphagia, hemoptysis, chest pain, palpitations, recent travel out of the country, cough or fatigue. Also states due for annual lung cancer screening.     Past Medical History:  Diagnosis Date   Allergy    seasonal   Arthritis    Asthma    Cataract    Phreesia 05/31/2020   Chronic kidney disease    Phreesia 05/31/2020   Colon polyps    Colon polyps    COPD (chronic obstructive pulmonary disease) ([facility])    Diverticulitis    Emphysema    Emphysema of lung ([place])    Phreesia 05/31/2020   GERD (gastroesophageal reflux disease)    Glaucoma    Phreesia 05/31/2020   Gum disease    H/O degenerative disc disease    Hemorrhoids    Hyperlipidemia    Phreesia 05/31/2020   Inguinal hernia    Melanoma ([place])    facial   Renal disease    Bright's Disease-childhood   Syncope    Umbilical hernia    Urinary tract infection     Past Surgical History:  Procedure Laterality Date   COLONOSCOPY     DENTAL SURGERY     HERNIA REPAIR     Umbilical & Inguinal   left knee surgery     MELANOMA EXCISION     facial   neck and shoulder injection     POLYPECTOMY     WISDOM TOOTH  EXTRACTION      Family History  Problem Relation Age of Onset   Colon cancer Mother    Heart attack Father        Deceased-Early 44s   Cancer Maternal Grandfather    Dementia Maternal Grandmother    Syncope episode Sister    Healthy Sister        x1   Healthy Daughter        x2   Healthy Son        x2   Colon polyps Neg Hx    Rectal cancer Neg Hx    Stomach cancer Neg Hx     Social History   Socioeconomic History   Marital status: Married    Spouse name: Not on file   Number of children: 4   Years of education: 12   Highest education level: Not on file  Occupational History   Occupation: Disability  Tobacco Use   Smoking status: Former    Packs/day: 2.00    Years: 40.00    Pack years: 80.00    Types: Cigarettes    Quit date: 04/16/2014    Years since quitting: 6.8   Smokeless tobacco: Former  Substance and Sexual Activity   Alcohol use: Yes  Alcohol/week: 14.0 standard drinks    Types: 14 Cans of beer per week    Comment: 2 beers a day maybe    Drug use: No   Sexual activity: Yes    Birth control/protection: None  Other Topics Concern   Not on file  Social History Narrative   Not on file   Social Determinants of Health   Financial Resource Strain: Not on file  Food Insecurity: Not on file  Transportation Needs: Not on file  Physical Activity: Not on file  Stress: Not on file  Social Connections: Not on file  Intimate Partner Violence: Not on file    Outpatient Medications Prior to Visit  Medication Sig Dispense Refill   rosuvastatin (CRESTOR) 10 MG tablet Take 1 tablet (10 mg total) by mouth 2 (two) times a week. 30 tablet 0   timolol (TIMOPTIC) 0.5 % ophthalmic solution Place 1 drop into the left eye every morning.     latanoprost (XALATAN) 0.005 % ophthalmic solution 1 drop at bedtime.     tamsulosin (FLOMAX) 0.4 MG CAPS capsule Take 0.4 mg by mouth daily.     No facility-administered medications prior to visit.    No Known  Allergies  ROS Review of Systems Review of Systems:  A fourteen system review of systems was performed and found to be positive as per HPI.   Objective:    Physical Exam General:  Pleasant and cooperative, in no acute distress  Neuro:  Alert and oriented,  extra-ocular muscles intact  HEENT:  Normocephalic, atraumatic, neck supple, no thyromegaly,   Skin:  no gross rash, warm, pink. Abdomen: Non-distended, non-tender, +BS, negative Murphy's sign and Rovsing's sign Cardiac:  RRR, S1 S2 Respiratory:  CTA B/L, Not using accessory muscles, speaking in full sentences- unlabored. GU: anal fissure and moderate external hemorrhoid noted, hemoccult negative Vascular:  Ext warm, no cyanosis apprec.; cap RF less 2 sec. Psych:  No HI/SI, judgement and insight good, Euthymic mood. Full Affect.  BP 114/72   Pulse 69   Temp 98.2 F (36.8 C)   Ht 5' 9"  (1.753 m)   Wt 210 lb (95.3 kg)   SpO2 97%   BMI 31.01 kg/m  Wt Readings from Last 3 Encounters:  02/20/21 210 lb (95.3 kg)  06/03/20 220 lb 11.2 oz (100.1 kg)  03/26/19 213 lb 8 oz (96.8 kg)     Health Maintenance Due  Topic Date Due   COVID-19 Vaccine (4 - Booster for Pfizer series) 03/29/2020    There are no preventive care reminders to display for this patient.  Lab Results  Component Value Date   TSH 2.220 02/20/2021   Lab Results  Component Value Date   WBC 9.0 02/20/2021   HGB 14.8 02/20/2021   HCT 43.9 02/20/2021   MCV 91 02/20/2021   PLT 336 02/20/2021   Lab Results  Component Value Date   NA 144 02/20/2021   K 4.5 02/20/2021   CO2 23 02/20/2021   GLUCOSE 94 02/20/2021   BUN 16 02/20/2021   CREATININE 0.93 02/20/2021   BILITOT 0.5 02/20/2021   ALKPHOS 60 02/20/2021   AST 14 02/20/2021   ALT 15 02/20/2021   PROT 6.7 02/20/2021   ALBUMIN 4.7 02/20/2021   CALCIUM 9.8 02/20/2021   EGFR 89 02/20/2021   GFR 78.95 03/29/2016   Lab Results  Component Value Date   CHOL 150 05/27/2020   Lab Results   Component Value Date   HDL 44 05/27/2020   Lab  Results  Component Value Date   LDLCALC 89 05/27/2020   Lab Results  Component Value Date   TRIG 89 05/27/2020   Lab Results  Component Value Date   CHOLHDL 3.4 05/27/2020   Lab Results  Component Value Date   HGBA1C 5.3 05/27/2020      Assessment & Plan:   Problem List Items Addressed This Visit       Cardiovascular and Mediastinum   Hemorrhoids - Primary   Relevant Orders   Ambulatory referral to Gastroenterology     Digestive   GERD   Relevant Medications   pantoprazole (PROTONIX) 20 MG tablet   Other Relevant Orders   Ambulatory referral to Gastroenterology   Other Visit Diagnoses     Night sweats       Relevant Orders   TSH (Completed)   CBC w/Diff (Completed)   T4, free (Completed)   T3 (Completed)   Smoking greater than 40 pack years       Relevant Orders   CT CHEST LUNG CA SCREEN LOW DOSE W/O CM   Abdominal pain, unspecified abdominal location       Relevant Orders   Comp Met (CMET) (Completed)   CBC w/Diff (Completed)   Lipase (Completed)   Hoarseness of voice       Relevant Orders   TSH (Completed)   T4, free (Completed)   T3 (Completed)   Altered bowel habits       Relevant Orders   Ambulatory referral to Gastroenterology      Will place referral to Gastroenterology for further evaluation of multiple GI complaints. Offered patient topical treatment options for hemorrhoid and declined at this time. Recommend to take stool softener. Will start empiric PPI therapy for possible GERD contributing to abdominal pain, hoarseness and nausea. Will collect labs to evaluate for other etiologies contributing to symptoms including night sweats such as thyroid disease, infection or possible malignancy.    Meds ordered this encounter  Medications   pantoprazole (PROTONIX) 20 MG tablet    Sig: Take 1 tablet (20 mg total) by mouth daily.    Dispense:  30 tablet    Refill:  2    Order Specific Question:    Supervising Provider    Answer:   Beatrice Lecher D [[id number]     Follow-up: Return in about 4 months (around 06/20/2021) for [place] and [place] .    Lorrene Reid, PA-C

## 2021-02-20 NOTE — Patient Instructions (Signed)
Hemorrhoids °Hemorrhoids are swollen veins in and around the rectum or anus. There are two types of hemorrhoids: °Internal hemorrhoids. These occur in the veins that are just inside the rectum. They may poke through to the outside and become irritated and painful. °External hemorrhoids. These occur in the veins that are outside the anus and can be felt as a painful swelling or hard lump near the anus. °Most hemorrhoids do not cause serious problems, and they can be managed with home treatments such as diet and lifestyle changes. If home treatments do not help the symptoms, procedures can be done to shrink or remove the hemorrhoids. °What are the causes? °This condition is caused by increased pressure in the anal area. This pressure may result from various things, including: °Constipation. °Straining to have a bowel movement. °Diarrhea. °Pregnancy. °Obesity. °Sitting for long periods of time. °Heavy lifting or other activity that causes you to strain. °Anal sex. °Riding a bike for a long period of time. °What are the signs or symptoms? °Symptoms of this condition include: °Pain. °Anal itching or irritation. °Rectal bleeding. °Leakage of stool (feces). °Anal swelling. °One or more lumps around the anus. °How is this diagnosed? °This condition can often be diagnosed through a visual exam. Other exams or tests may also be done, such as: °An exam that involves feeling the rectal area with a gloved hand (digital rectal exam). °An exam of the anal canal that is done using a small tube (anoscope). °A blood test, if you have lost a significant amount of blood. °A test to look inside the colon using a flexible tube with a camera on the end (sigmoidoscopy or colonoscopy). °How is this treated? °This condition can usually be treated at home. However, various procedures may be done if dietary changes, lifestyle changes, and other home treatments do not help your symptoms. These procedures can help make the hemorrhoids smaller or  remove them completely. Some of these procedures involve surgery, and others do not. Common procedures include: °Rubber band ligation. Rubber bands are placed at the base of the hemorrhoids to cut off their blood supply. °Sclerotherapy. Medicine is injected into the hemorrhoids to shrink them. °Infrared coagulation. A type of light energy is used to get rid of the hemorrhoids. °Hemorrhoidectomy surgery. The hemorrhoids are surgically removed, and the veins that supply them are tied off. °Stapled hemorrhoidopexy surgery. The surgeon staples the base of the hemorrhoid to the rectal wall. °Follow these instructions at home: °Eating and drinking ° °Eat foods that have a lot of fiber in them, such as whole grains, beans, nuts, fruits, and vegetables. °Ask your health care provider about taking products that have added fiber (fiber supplements). °Reduce the amount of fat in your diet. You can do this by eating low-fat dairy products, eating less red meat, and avoiding processed foods. °Drink enough fluid to keep your urine pale yellow. °Managing pain and swelling ° °Take warm sitz baths for 20 minutes, 3-4 times a day to ease pain and discomfort. You may do this in a bathtub or using a portable sitz bath that fits over the toilet. °If directed, apply ice to the affected area. Using ice packs between sitz baths may be helpful. °Put ice in a plastic bag. °Place a towel between your skin and the bag. °Leave the ice on for 20 minutes, 2-3 times a day. °General instructions °Take over-the-counter and prescription medicines only as told by your health care provider. °Use medicated creams or suppositories as told. °Get regular exercise.   Ask your health care provider how much and what kind of exercise is best for you. In general, you should do moderate exercise for at least 30 minutes on most days of the week (150 minutes each week). This can include activities such as walking, biking, or yoga. °Go to the bathroom when you have  the urge to have a bowel movement. Do not wait. °Avoid straining to have bowel movements. °Keep the anal area dry and clean. Use wet toilet paper or moist towelettes after a bowel movement. °Do not sit on the toilet for long periods of time. This increases blood pooling and pain. °Keep all follow-up visits as told by your health care provider. This is important. °Contact a health care provider if you have: °Increasing pain and swelling that are not controlled by treatment or medicine. °Difficulty having a bowel movement, or you are unable to have a bowel movement. °Pain or inflammation outside the area of the hemorrhoids. °Get help right away if you have: °Uncontrolled bleeding from your rectum. °Summary °Hemorrhoids are swollen veins in and around the rectum or anus. °Most hemorrhoids can be managed with home treatments such as diet and lifestyle changes. °Taking warm sitz baths can help ease pain and discomfort. °In severe cases, procedures or surgery can be done to shrink or remove the hemorrhoids. °This information is not intended to replace advice given to you by your health care provider. Make sure you discuss any questions you have with your health care provider. °Document Revised: 10/12/2020 Document Reviewed: 10/12/2020 °Elsevier Patient Education © 2022 Elsevier Inc. ° °

## 2021-02-21 LAB — CBC WITH DIFFERENTIAL/PLATELET
Basophils Absolute: 0 10*3/uL (ref 0.0–0.2)
Basos: 0 %
EOS (ABSOLUTE): 0.4 10*3/uL (ref 0.0–0.4)
Eos: 4 %
Hematocrit: 43.9 % (ref 37.5–51.0)
Hemoglobin: 14.8 g/dL (ref 13.0–17.7)
Immature Grans (Abs): 0 10*3/uL (ref 0.0–0.1)
Immature Granulocytes: 0 %
Lymphocytes Absolute: 3.7 10*3/uL — ABNORMAL HIGH (ref 0.7–3.1)
Lymphs: 41 %
MCH: 30.6 pg (ref 26.6–33.0)
MCHC: 33.7 g/dL (ref 31.5–35.7)
MCV: 91 fL (ref 79–97)
Monocytes Absolute: 0.7 10*3/uL (ref 0.1–0.9)
Monocytes: 7 %
Neutrophils Absolute: 4.2 10*3/uL (ref 1.4–7.0)
Neutrophils: 48 %
Platelets: 336 10*3/uL (ref 150–450)
RBC: 4.84 x10E6/uL (ref 4.14–5.80)
RDW: 12.5 % (ref 11.6–15.4)
WBC: 9 10*3/uL (ref 3.4–10.8)

## 2021-02-21 LAB — COMPREHENSIVE METABOLIC PANEL
ALT: 15 IU/L (ref 0–44)
AST: 14 IU/L (ref 0–40)
Albumin/Globulin Ratio: 2.4 — ABNORMAL HIGH (ref 1.2–2.2)
Albumin: 4.7 g/dL (ref 3.8–4.8)
Alkaline Phosphatase: 60 IU/L (ref 44–121)
BUN/Creatinine Ratio: 17 (ref 10–24)
BUN: 16 mg/dL (ref 8–27)
Bilirubin Total: 0.5 mg/dL (ref 0.0–1.2)
CO2: 23 mmol/L (ref 20–29)
Calcium: 9.8 mg/dL (ref 8.6–10.2)
Chloride: 105 mmol/L (ref 96–106)
Creatinine, Ser: 0.93 mg/dL (ref 0.76–1.27)
Globulin, Total: 2 g/dL (ref 1.5–4.5)
Glucose: 94 mg/dL (ref 70–99)
Potassium: 4.5 mmol/L (ref 3.5–5.2)
Sodium: 144 mmol/L (ref 134–144)
Total Protein: 6.7 g/dL (ref 6.0–8.5)
eGFR: 89 mL/min/{1.73_m2} (ref >59)

## 2021-02-21 LAB — T3: T3, Total: 136 ng/dL (ref 71–180)

## 2021-02-21 LAB — T4, FREE: Free T4: 1.33 ng/dL (ref 0.82–1.77)

## 2021-02-21 LAB — TSH: TSH: 2.22 u[IU]/mL (ref 0.450–4.500)

## 2021-02-21 LAB — LIPASE: Lipase: 21 U/L (ref 13–78)

## 2021-02-22 ENCOUNTER — Telehealth: Payer: Self-pay | Admitting: Physician Assistant

## 2021-02-22 NOTE — Telephone Encounter (Signed)
Contacted Cedtoria and she needed the previous office note to provide to insurance. She has started the approval process and got a Case ID number [id number]. Office note faxed via Epic.

## 2021-02-22 NOTE — Telephone Encounter (Signed)
Cedtoria with [facility] calling in regards to CT order for this patient. Please return her call at [phone]. AS, CMA

## 2021-02-23 ENCOUNTER — Encounter: Payer: Self-pay | Admitting: Gastroenterology

## 2021-02-28 ENCOUNTER — Ambulatory Visit: Payer: Medicare HMO | Admitting: Gastroenterology

## 2021-02-28 ENCOUNTER — Encounter: Payer: Self-pay | Admitting: Gastroenterology

## 2021-02-28 VITALS — BP 116/70 | HR 95 | Ht 69.0 in | Wt 208.1 lb

## 2021-02-28 DIAGNOSIS — R1013 Epigastric pain: Secondary | ICD-10-CM | POA: Diagnosis not present

## 2021-02-28 DIAGNOSIS — K582 Mixed irritable bowel syndrome: Secondary | ICD-10-CM

## 2021-02-28 DIAGNOSIS — R110 Nausea: Secondary | ICD-10-CM

## 2021-02-28 MED ORDER — HYDROCORTISONE ACETATE 25 MG RE SUPP: 25.0000 mg | Freq: Two times a day (BID) | RECTAL | 0 refills | Status: DC

## 2021-02-28 NOTE — Patient Instructions (Addendum)
Start Benefiber 1 tablespoon twice daily with meals.  Continue stool softeners as directed.  Start Pepcid 20mg  - Take 1 tablet by mouth twice daily. Pepcid can be purchased over the counter.   We have sent the following medications to your pharmacy for you to pick up at your convenience: Anusol Suppositories  Anusol Suppositories -Insert 1 suppository rectally  nightly for 2 weeks at bedtime.    Please keep follow up in 4-6 weeks on: 03/31/21 at 9:10am  If you are age 54 or older, your body mass index should be between 23-30. Your Body mass index is 30.73 kg/m. If this is out of the aforementioned range listed, please consider follow up with your Primary Care Provider.  ________________________________________________________  The [facility] providers would like to encourage you to use [facility] to communicate with providers for non-urgent requests or questions.  Due to long hold times on the telephone, sending your provider a message by [facility] may be a faster and more efficient way to get a response.  Please allow 48 business hours for a response.  Please remember that this is for non-urgent requests.   Thank you for choosing me and [facility].  Dr.Nandigam

## 2021-02-28 NOTE — Progress Notes (Signed)
Brandon Schultz    [id number]    1950-07-23  Primary Care Physician:Abonza, Samantha Crimes  Referring Physician: Lorrene Reid, PA-C [place],  [place] [address]   Chief complaint:  Nausea, abdominal pain, diarrhea, constipation  HPI: 70 yr old very pleasant gentleman here with c/o rectal bleeding from hemorrhoids, alternating, constipation and diarrhea, lower abdominal discomfort and nausea Hemorrhoids He is using stool softeners as needed He feels he bleeds excessively from hemorrhoids when his bowel habits are irregular and sometimes it bleeds even with out any straining   Colonoscopy July 27, 2016 - One 5 mm polyp in the ascending colon, removed with a cold snare. Resected and retrieved. - Moderate diverticulosis in the sigmoid colon, in the descending colon, in the transverse colon and in the ascending colon. There was narrowing of the colon in association with the diverticular opening. There was evidence of diverticular spasm. Peri-diverticular erythema was seen. There was evidence of an impacted diverticulum. There was no evidence of diverticular bleeding. - Non-bleeding internal hemorrhoids.   Outpatient Encounter Medications as of 02/28/2021  Medication Sig   pantoprazole (PROTONIX) 20 MG tablet Take 1 tablet (20 mg total) by mouth daily.   rosuvastatin (CRESTOR) 10 MG tablet Take 1 tablet (10 mg total) by mouth 2 (two) times a week.   timolol (TIMOPTIC) 0.5 % ophthalmic solution Place 1 drop into the left eye every morning.   No facility-administered encounter medications on file as of 02/28/2021.    Allergies as of 02/28/2021   (No Known Allergies)    Past Medical History:  Diagnosis Date   Allergy    seasonal   Arthritis    Asthma    Cataract    Phreesia 05/31/2020   Chronic kidney disease    Phreesia 05/31/2020   Colon polyps    Colon polyps    COPD (chronic obstructive pulmonary disease) (HCC)     Diverticulitis    Emphysema    Emphysema of lung ([place])    Phreesia 05/31/2020   GERD (gastroesophageal reflux disease)    Glaucoma    Phreesia 05/31/2020   Gum disease    H/O degenerative disc disease    Hemorrhoids    Hyperlipidemia    Phreesia 05/31/2020   Inguinal hernia    Melanoma ([place])    facial   Renal disease    Bright's Disease-childhood   Syncope    Umbilical hernia    Urinary tract infection     Past Surgical History:  Procedure Laterality Date   cataract surgery Bilateral 11/2020   COLONOSCOPY     DENTAL SURGERY     HERNIA REPAIR     Umbilical & Inguinal   left knee surgery     MELANOMA EXCISION     facial   neck and shoulder injection     POLYPECTOMY     WISDOM TOOTH EXTRACTION      Family History  Problem Relation Age of Onset   Colon cancer Mother    Heart attack Father        Deceased-Early 70s   Cancer Maternal Grandfather    Dementia Maternal Grandmother    Syncope episode Sister    Healthy Sister        x1   Healthy Daughter        x2   Healthy Son        x2   Colon polyps Neg Hx  Rectal cancer Neg Hx    Stomach cancer Neg Hx     Social History   Socioeconomic History   Marital status: Married    Spouse name: Not on file   Number of children: 4   Years of education: 12   Highest education level: Not on file  Occupational History   Occupation: Disability  Tobacco Use   Smoking status: Former    Packs/day: 2.00    Years: 40.00    Pack years: 80.00    Types: Cigarettes    Quit date: 04/16/2014    Years since quitting: 6.8   Smokeless tobacco: Former  Scientific laboratory technician Use: Never used  Substance and Sexual Activity   Alcohol use: Yes    Alcohol/week: 14.0 standard drinks    Types: 14 Cans of beer per week    Comment: 2 beers a day maybe    Drug use: No   Sexual activity: Yes    Birth control/protection: None  Other Topics Concern   Not on file  Social History Narrative   Not on file   Social Determinants of  Health   Financial Resource Strain: Not on file  Food Insecurity: Not on file  Transportation Needs: Not on file  Physical Activity: Not on file  Stress: Not on file  Social Connections: Not on file  Intimate Partner Violence: Not on file      Review of systems: All other review of systems negative except as mentioned in the HPI.   Physical Exam: Vitals:   02/28/21 1504  BP: 116/70  Pulse: 95   Body mass index is 30.73 kg/m. Gen:      No acute distress HEENT:  sclera anicteric Abd:      soft, non-tender; no palpable masses, no distension Ext:    No edema Neuro: alert and oriented x 3 Psych: normal mood and affect Rectal exam: small external hemorrhoids Anoscopy: internal hemorrhoids, no active bleeding, normal dentate line, no visible nodules  Data Reviewed:  Reviewed labs, radiology imaging, old records and pertinent past GI work up   Assessment and Plan/Recommendations:  58 yr very pleasant gentleman with c/o irregular bowel habits, alternating constipation and diarrhea and symptomatic bleeding hemorrhoids  IBS- constipation and diarrhea Start Benefiber 1 tablespoon 2-3 times daily with meals Increase water intake Continue daily stool softener  Symptomatic hemorrhoids: Use Anusol suppository daily at bedtime X 10-14 days Avoid excessive straining during defecation If he continues to have persistent bleeding, will consider hemorrhoidal band ligation  GERD, epigastric burning and intermittent nausea: Start Pepcid 20mg  twice daily as needed Anti reflux measures Will consider EGD if has no improvement  Return in 4-6 weeks  This visit required 55 minutes of patient care (this includes precharting, chart review, review of results, face-to-face time used for counseling as well as treatment plan and follow-up. The patient was provided an opportunity to ask questions and all were answered. The patient agreed with the plan and demonstrated an understanding of the  instructions.  Damaris Hippo , MD    CC: Lorrene Reid, PA-C

## 2021-03-02 ENCOUNTER — Other Ambulatory Visit: Payer: Self-pay

## 2021-03-02 ENCOUNTER — Ambulatory Visit
Admission: RE | Admit: 2021-03-02 | Discharge: 2021-03-02 | Disposition: A | Discharge status: Home / Self Care | Payer: Medicare HMO | Source: Ambulatory Visit | Attending: Physician Assistant | Admitting: Physician Assistant

## 2021-03-02 DIAGNOSIS — F17210 Nicotine dependence, cigarettes, uncomplicated: Secondary | ICD-10-CM

## 2021-03-02 DIAGNOSIS — Z87891 Personal history of nicotine dependence: Secondary | ICD-10-CM | POA: Diagnosis not present

## 2021-03-06 ENCOUNTER — Encounter: Payer: Self-pay | Admitting: Physician Assistant

## 2021-03-07 ENCOUNTER — Ambulatory Visit: Payer: Medicare HMO | Admitting: Gastroenterology

## 2021-03-14 ENCOUNTER — Ambulatory Visit: Payer: Medicare HMO | Admitting: Gastroenterology

## 2021-03-15 ENCOUNTER — Encounter: Payer: Self-pay | Admitting: Gastroenterology

## 2021-03-20 ENCOUNTER — Encounter: Payer: Self-pay | Admitting: Gastroenterology

## 2021-03-23 NOTE — Telephone Encounter (Signed)
Patient's wife called today asking how patient should proceed.  Please respond.  Thank you.

## 2021-03-31 ENCOUNTER — Ambulatory Visit: Payer: Medicare HMO | Admitting: Gastroenterology

## 2021-03-31 ENCOUNTER — Encounter: Payer: Self-pay | Admitting: Gastroenterology

## 2021-03-31 DIAGNOSIS — K219 Gastro-esophageal reflux disease without esophagitis: Secondary | ICD-10-CM | POA: Diagnosis not present

## 2021-03-31 DIAGNOSIS — R1013 Epigastric pain: Secondary | ICD-10-CM | POA: Diagnosis not present

## 2021-03-31 DIAGNOSIS — K582 Mixed irritable bowel syndrome: Secondary | ICD-10-CM

## 2021-03-31 DIAGNOSIS — K602 Anal fissure, unspecified: Secondary | ICD-10-CM | POA: Diagnosis not present

## 2021-03-31 DIAGNOSIS — K625 Hemorrhage of anus and rectum: Secondary | ICD-10-CM | POA: Diagnosis not present

## 2021-03-31 MED ORDER — BENEFIBER PO POWD: ORAL | 0 refills | Status: DC

## 2021-03-31 MED ORDER — POLYETHYLENE GLYCOL 3350 17 G PO PACK: PACK | ORAL | 0 refills | Status: DC

## 2021-03-31 MED ORDER — PANTOPRAZOLE SODIUM 40 MG PO TBEC: 40.0000 mg | DELAYED_RELEASE_TABLET | Freq: Every day | ORAL | 2 refills | Status: DC

## 2021-03-31 MED ORDER — DILTIAZEM GEL 2 %: CUTANEOUS | 0 refills | Status: DC

## 2021-03-31 NOTE — Progress Notes (Signed)
Brandon Schultz    [id number]    July 24, 1950  Primary Care Physician:Abonza, Samantha Crimes  Referring Physician: Lorrene Reid, PA-C [place],  [place] [address]   Chief complaint: Nausea, abdominal pain, bright red blood per rectum, constipation alternating with diarrhea  HPI: 70 yr old very pleasant gentleman here with c/o rectal bleeding from hemorrhoids, alternating, constipation and diarrhea, lower abdominal discomfort and nausea He is taking MiraLAX with improvement of bowel habits and he is not bleeding if he avoids straining and has regular bowel habits. He is experiencing lower abdominal pain mostly in the periumbilical area.  He also has on and off nausea. He is hesitant to take any medications, he has been healthy all along.  He is not taking pantoprazole daily.  Colonoscopy 07/27/2016 - One 5 mm polyp in the ascending colon, removed with a cold snare. Resected and retrieved. - Moderate diverticulosis in the sigmoid colon, in the descending colon, in the transverse colon and in the ascending colon. There was narrowing of the colon in association with the diverticular opening. There was evidence of diverticular spasm. Peri-diverticular erythema was seen. There was evidence of an impacted diverticulum. There was no evidence of diverticular bleeding. - Non-bleeding internal hemorrhoids.       Outpatient Encounter Medications as of 03/31/2021  Medication Sig   pantoprazole (PROTONIX) 20 MG tablet Take 1 tablet (20 mg total) by mouth daily.   rosuvastatin (CRESTOR) 10 MG tablet Take 1 tablet (10 mg total) by mouth 2 (two) times a week.   timolol (TIMOPTIC) 0.5 % ophthalmic solution Place 1 drop into the left eye every morning.   [DISCONTINUED] hydrocortisone (ANUSOL-HC) 25 MG suppository Place 1 suppository (25 mg total) rectally every 12 (twelve) hours.   No facility-administered encounter medications on file as of 03/31/2021.     Allergies as of 03/31/2021   (No Known Allergies)    Past Medical History:  Diagnosis Date   Allergy    seasonal   Arthritis    Asthma    Cataract    Phreesia 05/31/2020   Chronic kidney disease    Phreesia 05/31/2020   Colon polyps    Colon polyps    COPD (chronic obstructive pulmonary disease) (HCC)    Diverticulitis    Emphysema    Emphysema of lung ([place])    Phreesia 05/31/2020   GERD (gastroesophageal reflux disease)    Glaucoma    Phreesia 05/31/2020   Gum disease    H/O degenerative disc disease    Hemorrhoids    Hyperlipidemia    Phreesia 05/31/2020   Inguinal hernia    Melanoma ([place])    facial   Renal disease    Bright's Disease-childhood   Syncope    Umbilical hernia    Urinary tract infection     Past Surgical History:  Procedure Laterality Date   cataract surgery Bilateral 11/2020   COLONOSCOPY     DENTAL SURGERY     HERNIA REPAIR     Umbilical & Inguinal   left knee surgery     MELANOMA EXCISION     facial   neck and shoulder injection     POLYPECTOMY     WISDOM TOOTH EXTRACTION      Family History  Problem Relation Age of Onset   Colon cancer Mother    Heart attack Father        Deceased-Early 63s   Syncope episode  Sister    Healthy Sister        x1   Dementia Maternal Grandmother    Cancer Maternal Grandfather    Healthy Daughter        x2   Healthy Son        x2   Colon polyps Neg Hx    Rectal cancer Neg Hx    Stomach cancer Neg Hx     Social History   Socioeconomic History   Marital status: Married    Spouse name: Not on file   Number of children: 4   Years of education: 12   Highest education level: Not on file  Occupational History   Occupation: Disability  Tobacco Use   Smoking status: Former    Packs/day: 2.00    Years: 40.00    Pack years: 80.00    Types: Cigarettes    Quit date: 04/16/2014    Years since quitting: 6.9   Smokeless tobacco: Former  Scientific laboratory technician Use: Never used  Substance and  Sexual Activity   Alcohol use: Yes    Alcohol/week: 14.0 standard drinks    Types: 14 Cans of beer per week    Comment: 2 beers a day maybe    Drug use: No   Sexual activity: Yes    Birth control/protection: None  Other Topics Concern   Not on file  Social History Narrative   Not on file   Social Determinants of Health   Financial Resource Strain: Not on file  Food Insecurity: Not on file  Transportation Needs: Not on file  Physical Activity: Not on file  Stress: Not on file  Social Connections: Not on file  Intimate Partner Violence: Not on file      Review of systems: All other review of systems negative except as mentioned in the HPI.   Physical Exam: Vitals:   03/31/21 0921  BP: 110/70  Pulse: 68   Body mass index is 30.19 kg/m. Gen:      No acute distress HEENT:  sclera anicteric Abd:      soft, non-tender; no palpable masses, no distension Ext:    No edema Neuro: alert and oriented x 3 Psych: normal mood and affect  Data Reviewed:  Reviewed labs, radiology imaging, old records and pertinent past GI work up   Assessment and Plan/Recommendations:  80 yr very pleasant gentleman with c/o nausea, abdominal pain, irregular bowel habits, alternating constipation and diarrhea, symptomatic hemorrhoids and anal fissure  Anal fissure: Use diltiazem 2% gel small pea-sized amount per rectum 3-4 times daily for 6 to 8 weeks Avoid excessive straining Rectal bleeding secondary to anal fissure and hemorrhoids, he is anxious about possible polyps or mass lesions.  Plan to proceed with colonoscopy for further evaluation   IBS- constipation and diarrhea Continue MiraLAX and use Benefiber 1 tablespoon 2-3 times daily with meals Increase water intake   GERD, epigastric burning and intermittent nausea: Use pantoprazole 40 mg daily, 30 minutes before breakfast  Anti reflux measures Patient is anxious that he has persistent nausea, he did not notice any significant  improvement with low-dose pantoprazole.  We will plan to proceed with EGD for evaluation to exclude erosive esophagitis or any other etiology given persistent heartburn and reflux related symptoms   The risks and benefits as well as alternatives of endoscopic procedure(s) have been discussed and reviewed. All questions answered. The patient agrees to proceed.   Return in 3 months.   The patient was  provided an opportunity to ask questions and all were answered. The patient agreed with the plan and demonstrated an understanding of the instructions.  Damaris Hippo , MD    CC: Lorrene Reid, PA-C

## 2021-03-31 NOTE — Patient Instructions (Addendum)
If you are age 69 or older, your body mass index should be between 23-30. Your Body mass index is 30.19 kg/m. If this is out of the aforementioned range listed, please consider follow up with your Primary Care Provider. ________________________________________________________  The [facility] providers would like to encourage you to use [facility] to communicate with providers for non-urgent requests or questions.  Due to long hold times on the telephone, sending your provider a message by [facility] may be a faster and more efficient way to get a response.  Please allow 48 business hours for a response.  Please remember that this is for non-urgent requests.  _______________________________________________________  Brandon Schultz have been scheduled for an endoscopy and colonoscopy. Please follow the written instructions given to you at your visit today. Please pick up your prep supplies at the pharmacy within the next 1-3 days. If you use inhalers (even only as needed), please bring them with you on the day of your procedure.  We have sent the following medications to your pharmacy for you to pick up at your convenience:  INCREASE: Pantoprazole to 40mg  one tablet daily before breakfast meal each day.  We have sent a prescription for Diltiazem 2% gel to [facility] for you. Using your index finger, you should apply a small amount of medication inside the rectum up to your first knuckle/joint 3 to 4 times daily x 6 to 8 weeks.  [facility]'s information is below: Address: [address], [place], [place] [address]  [phone]  *Please DO NOT go directly from our office to pick up this medication! Give the pharmacy 1 day to process the prescription as this is compounded and takes time to make.   Due to recent changes in healthcare laws, you may see the results of your imaging and laboratory studies on MyChart before your provider has had a chance to review them.  We understand that in  some cases there may be results that are confusing or concerning to you. Not all laboratory results come back in the same time frame and the provider may be waiting for multiple results in order to interpret others.  Please give [place] 48 hours in order for your provider to thoroughly review all the results before contacting the office for clarification of your results.   You will need to follow up in our office in 4 months.  We do not have our schedules open at this time.  Please call our office at [phone] at the end of Jan 2023 to schedule this appointment.   Thank you for entrusting me with your care and choosing [facility].  Dr Silverio Decamp

## 2021-04-12 ENCOUNTER — Encounter: Payer: Self-pay | Admitting: Gastroenterology

## 2021-04-13 ENCOUNTER — Other Ambulatory Visit: Payer: Self-pay | Admitting: Physician Assistant

## 2021-04-13 DIAGNOSIS — E782 Mixed hyperlipidemia: Secondary | ICD-10-CM

## 2021-04-24 ENCOUNTER — Telehealth: Payer: Self-pay | Admitting: Physician Assistant

## 2021-04-24 ENCOUNTER — Encounter: Payer: Self-pay | Admitting: Physician Assistant

## 2021-04-24 ENCOUNTER — Other Ambulatory Visit: Payer: Self-pay

## 2021-04-24 DIAGNOSIS — I2510 Atherosclerotic heart disease of native coronary artery without angina pectoris: Secondary | ICD-10-CM

## 2021-04-24 NOTE — Telephone Encounter (Signed)
Patients wife left vm asking about referral to [facility] imaging. She stated in the message she has called there but they do not have an order. Can you please call her back? [phone]

## 2021-05-02 ENCOUNTER — Other Ambulatory Visit: Payer: Self-pay

## 2021-05-02 ENCOUNTER — Ambulatory Visit (HOSPITAL_BASED_OUTPATIENT_CLINIC_OR_DEPARTMENT_OTHER)
Admission: RE | Admit: 2021-05-02 | Discharge: 2021-05-02 | Disposition: A | Discharge status: Home / Self Care | Payer: Medicare PPO | Source: Ambulatory Visit | Attending: Physician Assistant | Admitting: Physician Assistant

## 2021-05-02 DIAGNOSIS — I2510 Atherosclerotic heart disease of native coronary artery without angina pectoris: Secondary | ICD-10-CM | POA: Insufficient documentation

## 2021-05-03 ENCOUNTER — Encounter: Payer: Self-pay | Admitting: Physician Assistant

## 2021-05-04 ENCOUNTER — Other Ambulatory Visit: Payer: Self-pay

## 2021-05-04 DIAGNOSIS — I2510 Atherosclerotic heart disease of native coronary artery without angina pectoris: Secondary | ICD-10-CM

## 2021-05-04 DIAGNOSIS — E782 Mixed hyperlipidemia: Secondary | ICD-10-CM

## 2021-05-04 DIAGNOSIS — I519 Heart disease, unspecified: Secondary | ICD-10-CM

## 2021-05-04 DIAGNOSIS — Z9189 Other specified personal risk factors, not elsewhere classified: Secondary | ICD-10-CM

## 2021-05-04 MED ORDER — ROSUVASTATIN CALCIUM 20 MG PO TABS: 20.0000 mg | ORAL_TABLET | Freq: Every day | ORAL | 1 refills | Status: DC

## 2021-05-04 NOTE — Telephone Encounter (Signed)
Patient contacted. Please see lab note.

## 2021-05-05 ENCOUNTER — Encounter: Payer: Self-pay | Admitting: Physician Assistant

## 2021-05-08 ENCOUNTER — Encounter: Payer: Self-pay | Admitting: Cardiology

## 2021-05-08 ENCOUNTER — Other Ambulatory Visit: Payer: Self-pay

## 2021-05-08 ENCOUNTER — Ambulatory Visit: Payer: Medicare PPO | Admitting: Cardiology

## 2021-05-08 VITALS — BP 128/75 | HR 68 | Ht 71.0 in | Wt 211.4 lb

## 2021-05-08 DIAGNOSIS — Z01810 Encounter for preprocedural cardiovascular examination: Secondary | ICD-10-CM

## 2021-05-08 DIAGNOSIS — E785 Hyperlipidemia, unspecified: Secondary | ICD-10-CM | POA: Diagnosis not present

## 2021-05-08 DIAGNOSIS — R079 Chest pain, unspecified: Secondary | ICD-10-CM | POA: Diagnosis not present

## 2021-05-08 DIAGNOSIS — R931 Abnormal findings on diagnostic imaging of heart and coronary circulation: Secondary | ICD-10-CM | POA: Diagnosis not present

## 2021-05-08 DIAGNOSIS — Z01818 Encounter for other preprocedural examination: Secondary | ICD-10-CM

## 2021-05-08 NOTE — Progress Notes (Signed)
Cardiology Office Note:    Date:  05/08/2021   ID:  Brandon Schultz, DOB 03/02/1951, MRN [id number]  PCP:  Lorrene Reid, PA-C  Cardiologist:  Donato Heinz, MD  Electrophysiologist:  None   Referring MD: Lorrene Reid, PA-C   Chief Complaint  Patient presents with   Coronary Artery Disease    History of Present Illness:    Brandon Schultz is a 71 y.o. male with a hx of COPD, asthma, CKD, GERD, hyperlipidemia, melanoma who is referred by Lorrene Reid, PA for evaluation of CAD and hyperlipidemia.  Calcium score on 05/02/2021 was 1733 (93rd percentile).  He reports intermittent chest pain.  Describes as pressure in center of his chest, occurs about 2 times per week.  Has not noted relationship with exertion, but does report he gets dyspnea with exertion.  He has a history of syncopal episodes, though none for years.  Has not been vasovagal syncope.  He denies any lower extremity edema or palpitations.  He smoked 2 packs/day x 40 years, quit in 2016.  Family history includes father died of MI in late 56s or early 27s (he does not know details as he is adopted).  Past Medical History:  Diagnosis Date   Allergy    seasonal   Arthritis    Asthma    Cataract    Phreesia 05/31/2020   Chronic kidney disease    Phreesia 05/31/2020   Colon polyps    Colon polyps    COPD (chronic obstructive pulmonary disease) (HCC)    Diverticulitis    Emphysema    Emphysema of lung ([place])    Phreesia 05/31/2020   GERD (gastroesophageal reflux disease)    Glaucoma    Phreesia 05/31/2020   Gum disease    H/O degenerative disc disease    Hemorrhoids    Hyperlipidemia    Phreesia 05/31/2020   Inguinal hernia    Melanoma ([place])    facial   Renal disease    Bright's Disease-childhood   Syncope    Umbilical hernia    Urinary tract infection     Past Surgical History:  Procedure Laterality Date   cataract surgery Bilateral 11/2020   COLONOSCOPY     DENTAL SURGERY     HERNIA  REPAIR     Umbilical & Inguinal   left knee surgery     MELANOMA EXCISION     facial   neck and shoulder injection     POLYPECTOMY     WISDOM TOOTH EXTRACTION      Current Medications: Current Meds  Medication Sig   diltiazem 2 % GEL Using your index finger, you should apply a small amount of medication inside the rectum up to your first knuckle/joint 3 to 4 times daily x 6 to 8 weeks.   pantoprazole (PROTONIX) 40 MG tablet Take 1 tablet (40 mg total) by mouth daily.   polyethylene glycol (MIRALAX) 17 g packet 1 - 2 capfuls daily   rosuvastatin (CRESTOR) 20 MG tablet Take 1 tablet (20 mg total) by mouth daily.   timolol (TIMOPTIC) 0.5 % ophthalmic solution Place 1 drop into the left eye every morning.   Wheat Dextrin (BENEFIBER) POWD 1 tablespoons three times daily with meals     Allergies:   Patient has no known allergies.   Social History   Socioeconomic History   Marital status: Married    Spouse name: Not on file   Number of children: 4   Years of education: 28  Highest education level: Not on file  Occupational History   Occupation: Disability  Tobacco Use   Smoking status: Former    Packs/day: 2.00    Years: 40.00    Pack years: 80.00    Types: Cigarettes    Quit date: 04/16/2014    Years since quitting: 7.0   Smokeless tobacco: Former  Scientific laboratory technician Use: Never used  Substance and Sexual Activity   Alcohol use: Yes    Alcohol/week: 14.0 standard drinks    Types: 14 Cans of beer per week    Comment: 2 beers a day maybe    Drug use: No   Sexual activity: Yes    Birth control/protection: None  Other Topics Concern   Not on file  Social History Narrative   Not on file   Social Determinants of Health   Financial Resource Strain: Not on file  Food Insecurity: Not on file  Transportation Needs: Not on file  Physical Activity: Not on file  Stress: Not on file  Social Connections: Not on file     Family History: The patient's family history  includes Cancer in his maternal grandfather; Colon cancer in his mother; Dementia in his maternal grandmother; Healthy in his daughter, sister, and son; Heart attack in his father; Syncope episode in his sister. There is no history of Colon polyps, Rectal cancer, or Stomach cancer.  ROS:   Please see the history of present illness.     All other systems reviewed and are negative.  EKGs/Labs/Other Studies Reviewed:    The following studies were reviewed today:   EKG:   05/08/21: NSR, rate 68, no ST abnormalities  Recent Labs: 02/20/2021: ALT 15; BUN 16; Creatinine, Ser 0.93; Hemoglobin 14.8; Platelets 336; Potassium 4.5; Sodium 144; TSH 2.220  Recent Lipid Panel    Component Value Date/Time   CHOL 150 05/27/2020 0921   TRIG 89 05/27/2020 0921   HDL 44 05/27/2020 0921   CHOLHDL 3.4 05/27/2020 0921   CHOLHDL 5 03/29/2016 1013   VLDL 25.0 03/29/2016 1013   LDLCALC 89 05/27/2020 0921    Physical Exam:    VS:  BP 128/75    Pulse 68    Ht 5\' 11"  (1.803 m)    Wt 211 lb 6.4 oz (95.9 kg)    SpO2 98%    BMI 29.48 kg/m     Wt Readings from Last 3 Encounters:  05/08/21 211 lb 6.4 oz (95.9 kg)  03/31/21 210 lb 6 oz (95.4 kg)  02/28/21 208 lb 2 oz (94.4 kg)     GEN:  Well nourished, well developed in no acute distress HEENT: Normal NECK: No JVD; No carotid bruits LYMPHATICS: No lymphadenopathy CARDIAC: RRR, no murmurs, rubs, gallops RESPIRATORY:  Clear to auscultation without rales, wheezing or rhonchi  ABDOMEN: Soft, non-tender, non-distended MUSCULOSKELETAL:  No edema; No deformity  SKIN: Warm and dry NEUROLOGIC:  Alert and oriented x 3 PSYCHIATRIC:  Normal affect   ASSESSMENT:    1. Chest pain of uncertain etiology   2. Elevated coronary artery calcium score   3. Pre-op evaluation   4. Hyperlipidemia, unspecified hyperlipidemia type    PLAN:    CAD: Calcium score on 05/02/2021 was 1733 (93rd percentile).  He reports atypical chest pain, but also having dyspnea on exertion  that could represent anginal equivalent -Recommend exercise Myoview to evaluate for ischemia -Echocardiogram to rule out structural heart disease -Started rosuvastatin 20 mg daily -Hold off on aspirin for now as having  GI bleeding issues.  Has EGD/colonoscopy on 1/31  Preop evaluation: Planning EGD/colonoscopy on 1/31.  Given his calcium score and chest pain/dyspnea, will plan treadmill Myoview prior to this as above  Hyperlipidemia: LDL 89 on 05/27/2020, started on rosuvastatin 20 mg daily after his calcium score   RTC in 6 months   Shared Decision Making/Informed Consent The risks [chest pain, shortness of breath, cardiac arrhythmias, dizziness, blood pressure fluctuations, myocardial infarction, stroke/transient ischemic attack, nausea, vomiting, allergic reaction, radiation exposure, metallic taste sensation and life-threatening complications (estimated to be 1 in 10,000)], benefits (risk stratification, diagnosing coronary artery disease, treatment guidance) and alternatives of a nuclear stress test were discussed in detail with Brandon Schultz and he agrees to proceed.    Medication Adjustments/Labs and Tests Ordered: Current medicines are reviewed at length with the patient today.  Concerns regarding medicines are outlined above.  Orders Placed This Encounter  Procedures   MYOCARDIAL PERFUSION IMAGING   EKG 12-Lead   ECHOCARDIOGRAM COMPLETE   No orders of the defined types were placed in this encounter.   Patient Instructions  Medication Instructions:  No Changes In Medications at this time.  *If you need a refill on your cardiac medications before your next appointment, please call your pharmacy*  Testing/Procedures: Your physician has requested that you have an echocardiogram. Echocardiography is a painless test that uses sound waves to create images of your heart. It provides your doctor with information about the size and shape of your heart and how well your hearts  chambers and valves are working. You may receive an ultrasound enhancing agent through an IV if needed to better visualize your heart during the echo.This procedure takes approximately one hour. There are no restrictions for this procedure. This will take place at the [address], [address].   Your physician has requested that you have en exercise stress myoview. For further information please visit [email or web address]. Please follow instruction sheet, as given. This will take place at [address]- CAN THIS BE SCHEDULED BEFORE 1/31 IF POSSIBLE?   Follow-Up: At [facility], you and your health needs are our priority.  As part of our continuing mission to provide you with exceptional heart care, we have created designated Provider Care Teams.  These Care Teams include your primary Cardiologist (physician) and Advanced Practice Providers (APPs -  Physician Assistants and Nurse Practitioners) who all work together to provide you with the care you need, when you need it.  Your next appointment:   6 month(s)  The format for your next appointment:   In Person  Provider:   Donato Heinz, MD     Signed, Donato Heinz, MD  05/08/2021 5:08 PM    [place]

## 2021-05-08 NOTE — Patient Instructions (Signed)
Medication Instructions:  No Changes In Medications at this time.  *If you need a refill on your cardiac medications before your next appointment, please call your pharmacy*  Testing/Procedures: Your physician has requested that you have an echocardiogram. Echocardiography is a painless test that uses sound waves to create images of your heart. It provides your doctor with information about the size and shape of your heart and how well your hearts chambers and valves are working. You may receive an ultrasound enhancing agent through an IV if needed to better visualize your heart during the echo.This procedure takes approximately one hour. There are no restrictions for this procedure. This will take place at the [address]   Your physician has requested that you have en exercise stress myoview. For further information please visit [email or web address]. Please follow instruction sheet, as given. This will take place at [address]- CAN THIS BE SCHEDULED BEFORE 1/31 IF POSSIBLE?   Follow-Up: At [facility], you and your health needs are our priority.  As part of our continuing mission to provide you with exceptional heart care, we have created designated Provider Care Teams.  These Care Teams include your primary Cardiologist (physician) and Advanced Practice Providers (APPs -  Physician Assistants and Nurse Practitioners) who all work together to provide you with the care you need, when you need it.  Your next appointment:   6 month(s)  The format for your next appointment:   In Person  Provider:   Donato Heinz, MD

## 2021-05-09 ENCOUNTER — Telehealth (HOSPITAL_COMMUNITY): Payer: Self-pay | Admitting: *Deleted

## 2021-05-09 NOTE — Telephone Encounter (Signed)
Patient given detailed instructions per Myocardial Perfusion Study Information Sheet for the test on 03/20/22 Patient notified to arrive 15 minutes early and that it is imperative to arrive on time for appointment to keep from having the test rescheduled.  If you need to cancel or reschedule your appointment, please call the office within 24 hours of your appointment. . Patient verbalized understanding.Brandon Schultz Jacqueline   

## 2021-05-10 ENCOUNTER — Other Ambulatory Visit: Payer: Self-pay

## 2021-05-10 ENCOUNTER — Telehealth: Payer: Self-pay

## 2021-05-10 ENCOUNTER — Ambulatory Visit (HOSPITAL_COMMUNITY): Payer: Medicare PPO | Attending: Cardiology

## 2021-05-10 DIAGNOSIS — R931 Abnormal findings on diagnostic imaging of heart and coronary circulation: Secondary | ICD-10-CM | POA: Insufficient documentation

## 2021-05-10 DIAGNOSIS — R079 Chest pain, unspecified: Secondary | ICD-10-CM | POA: Diagnosis present

## 2021-05-10 LAB — MYOCARDIAL PERFUSION IMAGING
Estimated workload: 10
Exercise duration (min): 8 min
LV dias vol: 83 mL (ref 62–150)
LV sys vol: 32 mL
MPHR: 151 {beats}/min
Nuc Stress EF: 61 %
Peak HR: 144 {beats}/min
Percent HR: 95 %
RPE: 1910
Rest HR: 62 {beats}/min
Rest Nuclear Isotope Dose: 10.9 mCi
SRS: 0
SSS: 1
Stress Nuclear Isotope Dose: 32.8 mCi
TID: 1.12

## 2021-05-10 MED ORDER — TECHNETIUM TC 99M TETROFOSMIN IV KIT
10.9000 | PACK | Freq: Once | INTRAVENOUS | Status: AC | PRN
  Administered 2021-05-10: 10.9 via INTRAVENOUS
  Filled 2021-05-10: qty 11

## 2021-05-10 MED ORDER — TECHNETIUM TC 99M TETROFOSMIN IV KIT
32.8000 | PACK | Freq: Once | INTRAVENOUS | Status: AC | PRN
  Administered 2021-05-10: 32.8 via INTRAVENOUS
  Filled 2021-05-10: qty 33

## 2021-05-10 NOTE — Telephone Encounter (Signed)
Nuc nonischemic Will wait for echo for clearance.

## 2021-05-10 NOTE — Telephone Encounter (Signed)
-----   Message from Mauri Pole, MD sent at 05/10/2021  9:28 AM EST ----- Regarding: RE: LEC pt Agree. Thanks for letting us know Beth/Robin, can you please cancel the procedure and reschedule it once he is cleared by cardiology. Thanks ----- Message ----- From: Osvaldo Angst, CRNA Sent: 05/10/2021   8:35 AM EST To: Mauri Pole, MD Subject: LEC pt                                         Dr. Sheppard Penton,  This pt is scheduled with you on 05/16/21.  He is undergoing a w/u for chest pain which was initiated on 05/08/21.  His LEC procedure will need to be postponed until this w/u is completed.  Thanks,  Osvaldo Angst

## 2021-05-10 NOTE — Telephone Encounter (Signed)
[facility] Pre-operative Risk Assessment     Request for surgical clearance:     Endoscopy Procedure  What type of surgery is being performed?     EGD and colonoscopy  When is this surgery scheduled?     06/08/21  What type of clearance is required ?   Medical  Are there any medications that need to be held prior to surgery and how long? no  Practice name and name of physician performing surgery?     Dr Harl Bowie, MD   [facility]  What is your office phone and fax number?      Phone- [phone]  [phone]  Anesthesia type (None, local, MAC, general) ?       MAC

## 2021-05-10 NOTE — Telephone Encounter (Signed)
Spoke with the spouse. She tells me they were assured by cardiology the testing would be completed in time for the EGD/colon. However, she agrees to reschedule the procedure as required. Moved his procedure date to 06/08/21 at 3:30 pm. Clearance request transmitted to Dr Gardiner Rhyme, Cardiologist.

## 2021-05-16 ENCOUNTER — Encounter: Payer: Medicare PPO | Admitting: Gastroenterology

## 2021-05-22 ENCOUNTER — Ambulatory Visit (HOSPITAL_COMMUNITY): Payer: Medicare PPO | Attending: Cardiology

## 2021-05-22 ENCOUNTER — Other Ambulatory Visit: Payer: Self-pay

## 2021-05-22 DIAGNOSIS — R079 Chest pain, unspecified: Secondary | ICD-10-CM | POA: Insufficient documentation

## 2021-05-22 LAB — ECHOCARDIOGRAM COMPLETE
Area-P 1/2: 2.81 cm2
S' Lateral: 3.1 cm

## 2021-05-23 ENCOUNTER — Other Ambulatory Visit: Payer: Self-pay

## 2021-05-23 NOTE — Telephone Encounter (Signed)
° °  Patient Name: Brandon Schultz  DOB: 12/09/1950 MRN: [id number]  Primary Cardiologist: Donato Heinz, MD  Chart reviewed as part of pre-operative protocol coverage. Patient was seen by Dr. Gardiner Rhyme 05/08/21 for chest pain. Nuclear stress test was obtained 05/10/21 as part of pre-procedure clearance and was low risk. 2D echo 05/22/21 with normal RV, mild dilation of aortic root, otherwise without significant abnormality.Testing felt to be reassuring. I reached out to patient for update on how he is doing. The patient affirms he has been doing well without any new cardiac symptoms. He has been taking an antacid for GERD symptoms and feels like it is helping. He is able to walk regularly without any angina or dyspnea. Therefore, based on ACC/AHA guidelines, the patient would be at acceptable risk for the planned procedure without further cardiovascular testing. The patient was advised that if he develops new symptoms prior to surgery to contact our office to arrange for a follow-up visit, and he verbalized understanding.  Will route this bundled recommendation to requesting provider via Epic fax function. Please call with questions.  Charlie Pitter, PA-C 05/23/2021, 8:06 AM

## 2021-05-30 ENCOUNTER — Other Ambulatory Visit: Payer: Medicare HMO

## 2021-05-30 ENCOUNTER — Other Ambulatory Visit: Payer: Self-pay

## 2021-05-30 DIAGNOSIS — R739 Hyperglycemia, unspecified: Secondary | ICD-10-CM

## 2021-05-30 DIAGNOSIS — Z0000 Encounter for general adult medical examination without abnormal findings: Secondary | ICD-10-CM

## 2021-05-30 DIAGNOSIS — E782 Mixed hyperlipidemia: Secondary | ICD-10-CM

## 2021-06-02 ENCOUNTER — Other Ambulatory Visit: Payer: Medicare PPO

## 2021-06-02 ENCOUNTER — Ambulatory Visit: Payer: Medicare HMO | Admitting: Physician Assistant

## 2021-06-02 ENCOUNTER — Other Ambulatory Visit: Payer: Self-pay

## 2021-06-02 DIAGNOSIS — R739 Hyperglycemia, unspecified: Secondary | ICD-10-CM

## 2021-06-02 DIAGNOSIS — Z0000 Encounter for general adult medical examination without abnormal findings: Secondary | ICD-10-CM

## 2021-06-02 DIAGNOSIS — E782 Mixed hyperlipidemia: Secondary | ICD-10-CM

## 2021-06-03 LAB — CBC WITH DIFFERENTIAL/PLATELET
Basophils Absolute: 0 10*3/uL (ref 0.0–0.2)
Basos: 1 %
EOS (ABSOLUTE): 0.4 10*3/uL (ref 0.0–0.4)
Eos: 5 %
Hematocrit: 44.4 % (ref 37.5–51.0)
Hemoglobin: 15.1 g/dL (ref 13.0–17.7)
Immature Grans (Abs): 0 10*3/uL (ref 0.0–0.1)
Immature Granulocytes: 0 %
Lymphocytes Absolute: 2.5 10*3/uL (ref 0.7–3.1)
Lymphs: 31 %
MCH: 30.7 pg (ref 26.6–33.0)
MCHC: 34 g/dL (ref 31.5–35.7)
MCV: 90 fL (ref 79–97)
Monocytes Absolute: 0.6 10*3/uL (ref 0.1–0.9)
Monocytes: 7 %
Neutrophils Absolute: 4.6 10*3/uL (ref 1.4–7.0)
Neutrophils: 56 %
Platelets: 262 10*3/uL (ref 150–450)
RBC: 4.92 x10E6/uL (ref 4.14–5.80)
RDW: 12.7 % (ref 11.6–15.4)
WBC: 8.1 10*3/uL (ref 3.4–10.8)

## 2021-06-03 LAB — COMPREHENSIVE METABOLIC PANEL
ALT: 19 IU/L (ref 0–44)
AST: 13 IU/L (ref 0–40)
Albumin/Globulin Ratio: 2 (ref 1.2–2.2)
Albumin: 4.6 g/dL (ref 3.8–4.8)
Alkaline Phosphatase: 51 IU/L (ref 44–121)
BUN/Creatinine Ratio: 27 — ABNORMAL HIGH (ref 10–24)
BUN: 21 mg/dL (ref 8–27)
Bilirubin Total: 0.5 mg/dL (ref 0.0–1.2)
CO2: 25 mmol/L (ref 20–29)
Calcium: 9.6 mg/dL (ref 8.6–10.2)
Chloride: 107 mmol/L — ABNORMAL HIGH (ref 96–106)
Creatinine, Ser: 0.78 mg/dL (ref 0.76–1.27)
Globulin, Total: 2.3 g/dL (ref 1.5–4.5)
Glucose: 93 mg/dL (ref 70–99)
Potassium: 4.3 mmol/L (ref 3.5–5.2)
Sodium: 145 mmol/L — ABNORMAL HIGH (ref 134–144)
Total Protein: 6.9 g/dL (ref 6.0–8.5)
eGFR: 97 mL/min/{1.73_m2} (ref >59)

## 2021-06-03 LAB — HEMOGLOBIN A1C
Est. average glucose Bld gHb Est-mCnc: 117 mg/dL
Hgb A1c MFr Bld: 5.7 % — ABNORMAL HIGH (ref 4.8–5.6)

## 2021-06-03 LAB — LIPID PANEL
Chol/HDL Ratio: 3 ratio (ref 0.0–5.0)
Cholesterol, Total: 145 mg/dL (ref 100–199)
HDL: 49 mg/dL (ref >39)
LDL Chol Calc (NIH): 81 mg/dL (ref 0–99)
Triglycerides: 80 mg/dL (ref 0–149)
VLDL Cholesterol Cal: 15 mg/dL (ref 5–40)

## 2021-06-03 LAB — TSH: TSH: 2.33 u[IU]/mL (ref 0.450–4.500)

## 2021-06-05 ENCOUNTER — Ambulatory Visit (INDEPENDENT_AMBULATORY_CARE_PROVIDER_SITE_OTHER): Payer: Medicare PPO | Admitting: Physician Assistant

## 2021-06-05 ENCOUNTER — Encounter: Payer: Self-pay | Admitting: Physician Assistant

## 2021-06-05 ENCOUNTER — Other Ambulatory Visit: Payer: Self-pay

## 2021-06-05 VITALS — Ht 70.0 in | Wt 210.0 lb

## 2021-06-05 DIAGNOSIS — E782 Mixed hyperlipidemia: Secondary | ICD-10-CM | POA: Diagnosis not present

## 2021-06-05 DIAGNOSIS — Z0000 Encounter for general adult medical examination without abnormal findings: Secondary | ICD-10-CM | POA: Diagnosis not present

## 2021-06-05 NOTE — Progress Notes (Signed)
Virtual Visit via Telephone Note:  I connected with Brandon Schultz by telephone and verified that I am speaking with the correct person using two identifiers.    I discussed the limitations, risks, security and privacy concerns for performing an evaluation and management service by telephone and the availability of in person appointments. The staff discussed with patient that there may be a patient responsible charge related to this service. The patient expressed understanding and agreed to proceed.   Location of Patient- Home Location of Provider- Office   Subjective:   Brandon Schultz is a 71 y.o. male who presents for Medicare Annual/Subsequent preventive examination.  Review of Systems    General:   No F/C, wt loss Pulm:   No DIB, SOB, pleuritic chest pain Card:  No CP, palpitations Abd:  No n/v/d or pain Ext:  No inc edema from baseline    Objective:    Today's Vitals   06/05/21 0838  Weight: 210 lb (95.3 kg)  Height: 5\' 10"  (1.778 m)   Body mass index is 30.13 kg/m.  Advanced Directives 07/27/2016 07/06/2016  Does Patient Have a Medical Advance Directive? Yes Yes  Type of Paramedic of [place];Living will [place];Living will    Current Medications (verified) Outpatient Encounter Medications as of 06/05/2021  Medication Sig   diltiazem 2 % GEL Using your index finger, you should apply a small amount of medication inside the rectum up to your first knuckle/joint 3 to 4 times daily x 6 to 8 weeks.   pantoprazole (PROTONIX) 40 MG tablet Take 1 tablet (40 mg total) by mouth daily.   polyethylene glycol (MIRALAX) 17 g packet 1 - 2 capfuls daily   rosuvastatin (CRESTOR) 20 MG tablet Take 1 tablet (20 mg total) by mouth daily.   timolol (TIMOPTIC) 0.5 % ophthalmic solution Place 1 drop into the left eye every morning.   Wheat Dextrin (BENEFIBER) POWD 1 tablespoons three times daily with meals   No facility-administered  encounter medications on file as of 06/05/2021.    Allergies (verified) Patient has no known allergies.   History: Past Medical History:  Diagnosis Date   Allergy    seasonal   Arthritis    Asthma    Cataract    Phreesia 05/31/2020   Chronic kidney disease    Phreesia 05/31/2020   Colon polyps    Colon polyps    COPD (chronic obstructive pulmonary disease) (HCC)    Diverticulitis    Emphysema    Emphysema of lung ([place])    Phreesia 05/31/2020   GERD (gastroesophageal reflux disease)    Glaucoma    Phreesia 05/31/2020   Gum disease    H/O degenerative disc disease    Hemorrhoids    Hyperlipidemia    Phreesia 05/31/2020   Inguinal hernia    Melanoma ([place])    facial   Renal disease    Bright's Disease-childhood   Syncope    Umbilical hernia    Urinary tract infection    Past Surgical History:  Procedure Laterality Date   cataract surgery Bilateral 11/2020   COLONOSCOPY     DENTAL SURGERY     HERNIA REPAIR     Umbilical & Inguinal   left knee surgery     MELANOMA EXCISION     facial   neck and shoulder injection     POLYPECTOMY     WISDOM TOOTH EXTRACTION     Family History  Problem Relation Age  of Onset   Colon cancer Mother    Heart attack Father        Deceased-Early 58s   Syncope episode Sister    Healthy Sister        x1   Dementia Maternal Grandmother    Cancer Maternal Grandfather    Healthy Daughter        x2   Healthy Son        x2   Colon polyps Neg Hx    Rectal cancer Neg Hx    Stomach cancer Neg Hx    Social History   Socioeconomic History   Marital status: Married    Spouse name: Not on file   Number of children: 4   Years of education: 12   Highest education level: Not on file  Occupational History   Occupation: Disability  Tobacco Use   Smoking status: Former    Packs/day: 2.00    Years: 40.00    Pack years: 80.00    Types: Cigarettes    Quit date: 04/16/2014    Years since quitting: 7.1   Smokeless tobacco: Former   Scientific laboratory technician Use: Never used  Substance and Sexual Activity   Alcohol use: Yes    Alcohol/week: 14.0 standard drinks    Types: 14 Cans of beer per week    Comment: 2 beers a day maybe    Drug use: No   Sexual activity: Yes    Birth control/protection: None  Other Topics Concern   Not on file  Social History Narrative   Not on file   Social Determinants of Health   Financial Resource Strain: Not on file  Food Insecurity: Not on file  Transportation Needs: Not on file  Physical Activity: Not on file  Stress: Not on file  Social Connections: Not on file    Tobacco Counseling Counseling given: Not Answered    Diabetic? no    Activities of Daily Living In your present state of health, do you have any difficulty performing the following activities: 06/05/2021  Hearing? Y  Vision? N  Difficulty concentrating or making decisions? N  Walking or climbing stairs? N  Dressing or bathing? N  Doing errands, shopping? N  Some recent data might be hidden    Patient Care Team: Lorrene Reid, PA-C as PCP - General (Physician Assistant) Donato Heinz, MD as PCP - Cardiology (Cardiology) [place], Jennefer Bravo, MD as Referring Physician (Dermatology)  Indicate any recent Medical Services you may have received from other than [facility] providers in the past year (date may be approximate).     Assessment:   This is a routine wellness examination for Brandon Schultz.  Hearing/Vision screen No results found.  Dietary issues and exercise activities discussed: -Heart healthy diet low in fat, carbohydrate and glucose. Patient stays active with doing house work and outdoor activities.   Goals Addressed   None   Depression Screen PHQ 2/9 Scores 06/05/2021 06/03/2020 03/26/2019 03/05/2019 05/20/2018 03/27/2018 02/24/2018  PHQ - 2 Score 0 0 0 0 0 0 0  PHQ- 9 Score 0 0 3 0 1 1 1     Fall Risk Fall Risk  06/05/2021 06/03/2020 03/26/2019 03/27/2018 03/29/2016  Falls in the past  year? 0 1 0 0 No  Number falls in past yr: 0 0 - - -  Injury with Fall? 0 0 - - -  Risk for fall due to : No Fall Risks - - - -  Follow up Falls evaluation completed  Falls evaluation completed Falls evaluation completed - -    FALL RISK PREVENTION PERTAINING TO THE HOME:  Any stairs in or around the home? Yes  If so, are there any without handrails? Yes  Home free of loose throw rugs in walkways, pet beds, electrical cords, etc? Yes  Adequate lighting in your home to reduce risk of falls? Yes   ASSISTIVE DEVICES UTILIZED TO PREVENT FALLS:  Life alert? No  Use of a cane, walker or w/c? No  Grab bars in the bathroom? No  Shower chair or bench in shower? No  Elevated toilet seat or a handicapped toilet? No   TIMED UP AND GO:   Was the test performed? No .   Telehealth Appointment  Length of time to ambulate 10 feet: 0 sec.     Cognitive Function: wnl's     6CIT Screen 06/05/2021 06/03/2020 03/26/2019  What Year? 0 points 4 points 0 points  What month? 0 points 0 points 0 points  What time? 0 points 0 points 3 points  Count back from 20 0 points 0 points 2 points  Months in reverse 4 points 4 points 4 points  Repeat phrase 0 points 0 points 4 points  Total Score 4 8 13     Immunizations Immunization History  Administered Date(s) Administered   Influenza, High Dose Seasonal PF 01/28/2019   Influenza,inj,quad, With Preservative 01/30/2018   Influenza-Unspecified 02/24/2016, 01/20/2020, 02/16/2021   PFIZER(Purple Top)SARS-COV-2 Vaccination 05/21/2019, 06/15/2019, 02/02/2020   Pneumococcal Conjugate-13 02/18/2013, 02/19/2015   Pneumococcal Polysaccharide-23 04/02/2018   Tdap 04/16/2016   Zoster Recombinat (Shingrix) 04/02/2018, 06/25/2018   Zoster, Live 02/19/2015    TDAP status: Up to date  Flu Vaccine status: Up to date  Pneumococcal vaccine status: Up to date  Covid-19 vaccine status: Completed vaccines  Qualifies for Shingles Vaccine? Yes   Zostavax completed  No   Shingrix Completed?: Yes  Screening Tests Health Maintenance  Topic Date Due   COVID-19 Vaccine (4 - Booster for Pfizer series) 03/29/2020   COLONOSCOPY (Pts 45-52yrs Insurance coverage will need to be confirmed)  08/06/2021   TETANUS/TDAP  04/16/2026   Pneumonia Vaccine 60+ Years old  Completed   INFLUENZA VACCINE  Completed   Hepatitis C Screening  Completed   Zoster Vaccines- Shingrix  Completed   HPV VACCINES  Aged Out    Health Maintenance  Health Maintenance Due  Topic Date Due   COVID-19 Vaccine (4 - Booster for Pfizer series) 03/29/2020    Colorectal cancer screening: Type of screening: Colonoscopy. Completed 07/27/2016. Repeat every 5 years  Lung Cancer Screening: (Low Dose CT Chest recommended if Age 60-80 years, 30 pack-year currently smoking OR have quit w/in 15years.) does qualify.   Lung Cancer Screening Referral: 03/02/2021 repeat in 02/2022  Additional Screening:  Hepatitis C Screening: does qualify; Completed 03/29/2016  Vision Screening: Recommended annual ophthalmology exams for early detection of glaucoma and other disorders of the eye. Is the patient up to date with their annual eye exam?  Yes  Who is the provider or what is the name of the office in which the patient attends annual eye exams? Dr. Nelda Marseille  If pt is not established with a provider, would they like to be referred to a provider to establish care? No .   Dental Screening: Recommended annual dental exams for proper oral hygiene  Community Resource Referral / Chronic Care Management: CRR required this visit?  No   CCM required this visit?  No  Plan:  -Discussed with patient most recent lab results which are essentially within normal limits or stable from prior with the exception of A1c which is mildly elevated. Patient reports will be going to [place] beach [place] for several months and returning around Fall. Advised to contact the office to schedule lab visit when back in the area  for repeat fasting labs. Pt verbalized understanding. -LDL has improved from 89 to 81, will continue rosuvastatin 20 mg. If LDL remains >70 when repeated then recommend increasing rosuvastatin to 40 mg. -Continue to follow up with various specialists.  I have personally reviewed and noted the following in the patients chart:   Medical and social history Use of alcohol, tobacco or illicit drugs  Current medications and supplements including opioid prescriptions. Patient is not currently taking opioid prescriptions. Functional ability and status Nutritional status Physical activity Advanced directives List of other physicians Hospitalizations, surgeries, and ER visits in previous 12 months Vitals Screenings to include cognitive, depression, and falls Referrals and appointments  In addition, I have reviewed and discussed with patient certain preventive protocols, quality metrics, and best practice recommendations. A written personalized care plan for preventive services as well as general preventive health recommendations were provided to patient.    Lorrene Reid, PA-C   06/05/2021

## 2021-06-05 NOTE — Patient Instructions (Signed)
Preventive Care 65 Years and Older, Male °Preventive care refers to lifestyle choices and visits with your health care provider that can promote health and wellness. Preventive care visits are also called wellness exams. °What can I expect for my preventive care visit? °Counseling °During your preventive care visit, your health care provider may ask about your: °Medical history, including: °Past medical problems. °Family medical history. °History of falls. °Current health, including: °Emotional well-being. °Home life and relationship well-being. °Sexual activity. °Memory and ability to understand (cognition). °Lifestyle, including: °Alcohol, nicotine or tobacco, and drug use. °Access to firearms. °Diet, exercise, and sleep habits. °Work and work environment. °Sunscreen use. °Safety issues such as seatbelt and bike helmet use. °Physical exam °Your health care provider will check your: °Height and weight. These may be used to calculate your BMI (body mass index). BMI is a measurement that tells if you are at a healthy weight. °Waist circumference. This measures the distance around your waistline. This measurement also tells if you are at a healthy weight and may help predict your risk of certain diseases, such as type 2 diabetes and high blood pressure. °Heart rate and blood pressure. °Body temperature. °Skin for abnormal spots. °What immunizations do I need? °Vaccines are usually given at various ages, according to a schedule. Your health care provider will recommend vaccines for you based on your age, medical history, and lifestyle or other factors, such as travel or where you work. °What tests do I need? °Screening °Your health care provider may recommend screening tests for certain conditions. This may include: °Lipid and cholesterol levels. °Diabetes screening. This is done by checking your blood sugar (glucose) after you have not eaten for a while (fasting). °Hepatitis C test. °Hepatitis B test. °HIV (human  immunodeficiency virus) test. °STI (sexually transmitted infection) testing, if you are at risk. °Lung cancer screening. °Colorectal cancer screening. °Prostate cancer screening. °Abdominal aortic aneurysm (AAA) screening. You may need this if you are a current or former smoker. °Talk with your health care provider about your test results, treatment options, and if necessary, the need for more tests. °Follow these instructions at home: °Eating and drinking ° °Eat a diet that includes fresh fruits and vegetables, whole grains, lean protein, and low-fat dairy products. Limit your intake of foods with high amounts of sugar, saturated fats, and salt. °Take vitamin and mineral supplements as recommended by your health care provider. °Do not drink alcohol if your health care provider tells you not to drink. °If you drink alcohol: °Limit how much you have to 0-2 drinks a day. °Know how much alcohol is in your drink. In the [place] one drink equals one 12 oz bottle of beer (355 mL), one 5 oz glass of wine (148 mL), or one 1½ oz glass of hard liquor (44 mL). °Lifestyle °Brush your teeth every morning and night with fluoride toothpaste. Floss one time each day. °Exercise for at least 30 minutes 5 or more days each week. °Do not use any products that contain nicotine or tobacco. These products include cigarettes, chewing tobacco, and vaping devices, such as e-cigarettes. If you need help quitting, ask your health care provider. °Do not use drugs. °If you are sexually active, practice safe sex. Use a condom or other form of protection to prevent STIs. °Take aspirin only as told by your health care provider. Make sure that you understand how much to take and what form to take. Work with your health care provider to find out whether it is safe and   beneficial for you to take aspirin daily. °Ask your health care provider if you need to take a cholesterol-lowering medicine (statin). °Find healthy ways to manage stress, such  as: °Meditation, yoga, or listening to music. °Journaling. °Talking to a trusted person. °Spending time with friends and family. °Safety °Always wear your seat belt while driving or riding in a vehicle. °Do not drive: °If you have been drinking alcohol. Do not ride with someone who has been drinking. °When you are tired or distracted. °While texting. °If you have been using any mind-altering substances or drugs. °Wear a helmet and other protective equipment during sports activities. °If you have firearms in your house, make sure you follow all gun safety procedures. °Minimize exposure to UV radiation to reduce your risk of skin cancer. °What's next? °Visit your health care provider once a year for an annual wellness visit. °Ask your health care provider how often you should have your eyes and teeth checked. °Stay up to date on all vaccines. °This information is not intended to replace advice given to you by your health care provider. Make sure you discuss any questions you have with your health care provider. °Document Revised: 09/28/2020 Document Reviewed: 09/28/2020 °Elsevier Patient Education © 2022 [employer] ° °

## 2021-06-08 ENCOUNTER — Encounter: Payer: Self-pay | Admitting: Gastroenterology

## 2021-06-08 ENCOUNTER — Ambulatory Visit (AMBULATORY_SURGERY_CENTER): Payer: Medicare PPO | Admitting: Gastroenterology

## 2021-06-08 VITALS — BP 106/67 | HR 59 | Temp 97.5°F | Resp 11 | Ht 70.0 in | Wt 210.0 lb

## 2021-06-08 DIAGNOSIS — K219 Gastro-esophageal reflux disease without esophagitis: Secondary | ICD-10-CM | POA: Diagnosis not present

## 2021-06-08 DIAGNOSIS — D123 Benign neoplasm of transverse colon: Secondary | ICD-10-CM

## 2021-06-08 DIAGNOSIS — Z86010 Personal history of colonic polyps: Secondary | ICD-10-CM | POA: Diagnosis not present

## 2021-06-08 DIAGNOSIS — K2970 Gastritis, unspecified, without bleeding: Secondary | ICD-10-CM

## 2021-06-08 DIAGNOSIS — K2270 Barrett's esophagus without dysplasia: Secondary | ICD-10-CM

## 2021-06-08 DIAGNOSIS — Z800 Family history of malignant neoplasm of digestive organs: Secondary | ICD-10-CM

## 2021-06-08 DIAGNOSIS — D122 Benign neoplasm of ascending colon: Secondary | ICD-10-CM

## 2021-06-08 DIAGNOSIS — K31A0 Gastric intestinal metaplasia, unspecified: Secondary | ICD-10-CM | POA: Diagnosis not present

## 2021-06-08 DIAGNOSIS — K625 Hemorrhage of anus and rectum: Secondary | ICD-10-CM

## 2021-06-08 DIAGNOSIS — R1013 Epigastric pain: Secondary | ICD-10-CM

## 2021-06-08 DIAGNOSIS — D128 Benign neoplasm of rectum: Secondary | ICD-10-CM

## 2021-06-08 DIAGNOSIS — K582 Mixed irritable bowel syndrome: Secondary | ICD-10-CM

## 2021-06-08 HISTORY — PX: COLONOSCOPY: SHX174

## 2021-06-08 MED ORDER — SODIUM CHLORIDE 0.9 % IV SOLN: 500.0000 mL | Freq: Once | INTRAVENOUS | Status: DC

## 2021-06-08 MED ORDER — PANTOPRAZOLE SODIUM 40 MG PO TBEC: 40.0000 mg | DELAYED_RELEASE_TABLET | Freq: Two times a day (BID) | ORAL | 0 refills | Status: DC

## 2021-06-08 NOTE — Patient Instructions (Addendum)
Read all of the handouts given to you by your recovery room nurse.  Take your medication as directed 1/2 hour before breakfast and supper on an empty stomach.  Make an office appointment in early March for mid April appointment.  The schedule is not out yet.  YOU HAD AN ENDOSCOPIC PROCEDURE TODAY AT [facility]:   Refer to the procedure report that was given to you for any specific questions about what was found during the examination.  If the procedure report does not answer your questions, please call your gastroenterologist to clarify.  If you requested that your care partner not be given the details of your procedure findings, then the procedure report has been included in a sealed envelope for you to review at your convenience later.  YOU SHOULD EXPECT: Some feelings of bloating in the abdomen. Passage of more gas than usual.  Walking can help get rid of the air that was put into your GI tract during the procedure and reduce the bloating. If you had a lower endoscopy (such as a colonoscopy or flexible sigmoidoscopy) you may notice spotting of blood in your stool or on the toilet paper. If you underwent a bowel prep for your procedure, you may not have a normal bowel movement for a few days.  Please Note:  You might notice some irritation and congestion in your nose or some drainage.  This is from the oxygen used during your procedure.  There is no need for concern and it should clear up in a day or so.  SYMPTOMS TO REPORT IMMEDIATELY:  Following lower endoscopy (colonoscopy or flexible sigmoidoscopy):  Excessive amounts of blood in the stool  Significant tenderness or worsening of abdominal pains  Swelling of the abdomen that is new, acute  Fever of 100F or higher  Following upper endoscopy (EGD)  Vomiting of blood or coffee ground material  New chest pain or pain under the shoulder blades  Painful or persistently difficult swallowing  New shortness of breath  Fever of  100F or higher  Black, tarry-looking stools  For urgent or emergent issues, a gastroenterologist can be reached at any hour by calling [phone]. Do not use MyChart messaging for urgent concerns.    DIET:  We do recommend a small meal at first, but then you may proceed to your regular diet.  Drink plenty of fluids but you should avoid alcoholic beverages for 24 hours.  ACTIVITY:  You should plan to take it easy for the rest of today and you should NOT DRIVE or use heavy machinery until tomorrow (because of the sedation medicines used during the test).    FOLLOW UP: Our staff will call the number listed on your records 48-72 hours following your procedure to check on you and address any questions or concerns that you may have regarding the information given to you following your procedure. If we do not reach you, we will leave a message.  We will attempt to reach you two times.  During this call, we will ask if you have developed any symptoms of COVID 19. If you develop any symptoms (ie: fever, flu-like symptoms, shortness of breath, cough etc.) before then, please call [phone].  If you test positive for Covid 19 in the 2 weeks post procedure, please call and report this information to [place].    If any biopsies were taken you will be contacted by phone or by letter within the next 1-3 weeks.  Please call us at [phone] if you have not heard about the biopsies in 3 weeks.    SIGNATURES/CONFIDENTIALITY: You and/or your care partner have signed paperwork which will be entered into your electronic medical record.  These signatures attest to the fact that that the information above on your After Visit Summary has been reviewed and is understood.  Full responsibility of the confidentiality of this discharge information lies with you and/or your care-partner.

## 2021-06-08 NOTE — Progress Notes (Signed)
Called to room to assist during endoscopic procedure.  Patient ID and intended procedure confirmed with present staff. Received instructions for my participation in the procedure from the performing physician.  

## 2021-06-08 NOTE — Progress Notes (Signed)
Pt in recovery with monitors in place, VSS. Report given to receiving RN. Bite guard was placed with pt awake to ensure comfort. No dental or soft tissue damage noted. 

## 2021-06-08 NOTE — Progress Notes (Signed)
[facility] History and Physical   Primary Care Physician:  Lorrene Reid, PA-C   Reason for Procedure:  GERD, epigastric abdominal pain, colon polyps, family h/o colon cancer  Plan:    EGD and colonoscopy with possible interventions as needed     HPI: Brandon Schultz is a very pleasant 71 y.o. male here for EGD and colonoscopy for evaluation of epigastric abdominal pain, worsening GERD symptoms and h/o colon polyps. Family history is positive for colon cancer.  Rectal bleeding with treatment of anal fissure. Please refer to office visit note 03/31/21 for additional details.  The risks and benefits as well as alternatives of endoscopic procedure(s) have been discussed and reviewed. All questions answered. The patient agrees to proceed.    Past Medical History:  Diagnosis Date   Allergy    seasonal   Arthritis    Asthma    Cataract    Phreesia 05/31/2020   Chronic kidney disease    Phreesia 05/31/2020   Colon polyps    Colon polyps    COPD (chronic obstructive pulmonary disease) (HCC)    Diverticulitis    Emphysema    Emphysema of lung ([place])    Phreesia 05/31/2020   GERD (gastroesophageal reflux disease)    Glaucoma    Phreesia 05/31/2020   Gum disease    H/O degenerative disc disease    Hemorrhoids    Hyperlipidemia    Phreesia 05/31/2020   Inguinal hernia    Melanoma ([place])    facial   Renal disease    Bright's Disease-childhood   Syncope    Umbilical hernia    Urinary tract infection     Past Surgical History:  Procedure Laterality Date   cataract surgery Bilateral 11/2020   COLONOSCOPY     DENTAL SURGERY     HERNIA REPAIR     Umbilical & Inguinal   left knee surgery     MELANOMA EXCISION     facial   neck and shoulder injection     POLYPECTOMY     WISDOM TOOTH EXTRACTION      Prior to Admission medications   Medication Sig Start Date End Date Taking? Authorizing Provider  diltiazem 2 % GEL Using your index finger, you should  apply a small amount of medication inside the rectum up to your first knuckle/joint 3 to 4 times daily x 6 to 8 weeks. 03/31/21  Yes Lonisha Bobby, Venia Minks, MD  pantoprazole (PROTONIX) 40 MG tablet Take 1 tablet (40 mg total) by mouth daily. 03/31/21  Yes Abelina Ketron, Venia Minks, MD  polyethylene glycol (MIRALAX) 17 g packet 1 - 2 capfuls daily 03/31/21  Yes Terek Bee V, MD  rosuvastatin (CRESTOR) 20 MG tablet Take 1 tablet (20 mg total) by mouth daily. 05/04/21  Yes Abonza, Maritza, PA-C  timolol (TIMOPTIC) 0.5 % ophthalmic solution Place 1 drop into the left eye every morning. 11/21/20  Yes [provider]  Wheat Dextrin (BENEFIBER) POWD 1 tablespoons three times daily with meals 03/31/21  Yes Deangelo Berns, Venia Minks, MD    Current Outpatient Medications  Medication Sig Dispense Refill   diltiazem 2 % GEL Using your index finger, you should apply a small amount of medication inside the rectum up to your first knuckle/joint 3 to 4 times daily x 6 to 8 weeks. 30 g 0   pantoprazole (PROTONIX) 40 MG tablet Take 1 tablet (40 mg total) by mouth daily. 90 tablet 2   polyethylene glycol (MIRALAX) 17 g packet 1 - 2  capfuls daily 14 each 0   rosuvastatin (CRESTOR) 20 MG tablet Take 1 tablet (20 mg total) by mouth daily. 90 tablet 1   timolol (TIMOPTIC) 0.5 % ophthalmic solution Place 1 drop into the left eye every morning.     Wheat Dextrin (BENEFIBER) POWD 1 tablespoons three times daily with meals 30 g 0   Current Facility-Administered Medications  Medication Dose Route Frequency Provider Last Rate Last Admin   0.9 %  sodium chloride infusion  500 mL Intravenous Once Zillah Alexie, Venia Minks, MD        Allergies as of 06/08/2021   (No Known Allergies)    Family History  Problem Relation Age of Onset   Colon cancer Mother    Heart attack Father        Deceased-Early 22s   Syncope episode Sister    Healthy Sister        x1   Dementia Maternal Grandmother    Cancer Maternal Grandfather     Healthy Daughter        x2   Healthy Son        x2   Colon polyps Neg Hx    Rectal cancer Neg Hx    Stomach cancer Neg Hx     Social History   Socioeconomic History   Marital status: Married    Spouse name: Not on file   Number of children: 4   Years of education: 12   Highest education level: Not on file  Occupational History   Occupation: Disability  Tobacco Use   Smoking status: Former    Packs/day: 2.00    Years: 40.00    Pack years: 80.00    Types: Cigarettes    Quit date: 04/16/2014    Years since quitting: 7.1   Smokeless tobacco: Former  Scientific laboratory technician Use: Never used  Substance and Sexual Activity   Alcohol use: Yes    Alcohol/week: 14.0 standard drinks    Types: 14 Cans of beer per week    Comment: 2 beers a day maybe    Drug use: No   Sexual activity: Yes    Birth control/protection: None  Other Topics Concern   Not on file  Social History Narrative   Not on file   Social Determinants of Health   Financial Resource Strain: Not on file  Food Insecurity: Not on file  Transportation Needs: Not on file  Physical Activity: Not on file  Stress: Not on file  Social Connections: Not on file  Intimate Partner Violence: Not on file    Review of Systems:  All other review of systems negative except as mentioned in the HPI.  Physical Exam: Vital signs in last 24 hours: BP 115/65    Pulse 68    Temp (!) 97.5 F (36.4 C)    Ht 5\' 10"  (1.778 m)    Wt 210 lb (95.3 kg)    SpO2 96%    BMI 30.13 kg/m  General:   Alert, NAD Lungs:  Clear .   Heart:  Regular rate and rhythm Abdomen:  Soft, nontender and nondistended. Neuro/Psych:  Alert and cooperative. Normal mood and affect. A and O x 3  Reviewed labs, radiology imaging, old records and pertinent past GI work up  Patient is appropriate for planned procedure(s) and anesthesia in an ambulatory setting   K. Denzil Magnuson , MD [phone]

## 2021-06-08 NOTE — Op Note (Signed)
[place] Patient Name: Brandon Schultz Procedure Date: 06/08/2021 3:23 PM MRN: [id number] Endoscopist: Mauri Pole , MD Age: 71 Referring MD:  Date of Birth: 15-Jul-1950 Gender: Male Account #: [id number] Procedure:                Upper GI endoscopy Indications:              Epigastric abdominal pain, Esophageal reflux                            symptoms that persist despite appropriate therapy Medicines:                Monitored Anesthesia Care Procedure:                Pre-Anesthesia Assessment:                           - Prior to the procedure, a History and Physical                            was performed, and patient medications and                            allergies were reviewed. The patient's tolerance of                            previous anesthesia was also reviewed. The risks                            and benefits of the procedure and the sedation                            options and risks were discussed with the patient.                            All questions were answered, and informed consent                            was obtained. Prior Anticoagulants: The patient has                            taken no previous anticoagulant or antiplatelet                            agents. ASA Grade Assessment: III - A patient with                            severe systemic disease. After reviewing the risks                            and benefits, the patient was deemed in                            satisfactory condition to undergo the procedure.  After obtaining informed consent, the endoscope was                            passed under direct vision. Throughout the                            procedure, the patient's blood pressure, pulse, and                            oxygen saturations were monitored continuously. The                            GIF HQ190 #[id number] was introduced through the                            mouth,  and advanced to the second part of duodenum.                            The upper GI endoscopy was accomplished without                            difficulty. The patient tolerated the procedure                            well. Scope In: Scope Out: Findings:                 There were esophageal mucosal changes suggestive of                            long-segment Barrett's esophagus present in the                            lower third of the esophagus from 36 to 40cm. The                            maximum longitudinal extent of these mucosal                            changes was 4 cm in length. Biopsies were taken                            with a cold forceps for histology.                           A 5 cm hiatal hernia was present.                           Patchy mild inflammation characterized by                            congestion (edema) and erythema was found in the                            entire  examined stomach. Biopsies were taken with a                            cold forceps for Helicobacter pylori testing.                           The examined duodenum was normal. Complications:            No immediate complications. Estimated Blood Loss:     Estimated blood loss was minimal. Impression:               - Esophageal mucosal changes suggestive of                            long-segment Barrett's esophagus. Biopsied.                           - 5 cm hiatal hernia.                           - Gastritis. Biopsied.                           - Normal examined duodenum. Recommendation:           - Patient has a contact number available for                            emergencies. The signs and symptoms of potential                            delayed complications were discussed with the                            patient. Return to normal activities tomorrow.                            Written discharge instructions were provided to the                            patient.                            - Resume previous diet.                           - Continue present medications.                           - Await pathology results.                           - Follow an antireflux regimen.                           - Use Protonix (pantoprazole) 40 mg PO BID. Rx for  90 days.                           - Follow up in office visit in 2-3 months Mauri Pole, MD 06/08/2021 4:31:58 PM This report has been signed electronically.

## 2021-06-08 NOTE — Op Note (Signed)
[place] Patient Name: Brandon Schultz Procedure Date: 06/08/2021 3:23 PM MRN: [id number] Endoscopist: Mauri Pole , MD Age: 71 Referring MD:  Date of Birth: [birth year] Gender: Male Account #: [id number] Procedure:                Colonoscopy Indications:              Screening in patient at increased risk: Family                            history of 1st-degree relative with colorectal                            cancer, High risk colon cancer surveillance:                            Personal history of colonic polyps, High risk colon                            cancer surveillance: Personal history of sessile                            serrated colon polyp (less than 10 mm in size) with                            no dysplasia Medicines:                Monitored Anesthesia Care Procedure:                Pre-Anesthesia Assessment:                           - Prior to the procedure, a History and Physical                            was performed, and patient medications and                            allergies were reviewed. The patient's tolerance of                            previous anesthesia was also reviewed. The risks                            and benefits of the procedure and the sedation                            options and risks were discussed with the patient.                            All questions were answered, and informed consent                            was obtained. Prior Anticoagulants: The patient has  taken no previous anticoagulant or antiplatelet                            agents. ASA Grade Assessment: III - A patient with                            severe systemic disease. After reviewing the risks                            and benefits, the patient was deemed in                            satisfactory condition to undergo the procedure.                           After obtaining informed consent, the  colonoscope                            was passed under direct vision. Throughout the                            procedure, the patient's blood pressure, pulse, and                            oxygen saturations were monitored continuously. The                            [id number]-HQ190L Colonoscope was introduced through the                            anus and advanced to the the cecum, identified by                            appendiceal orifice and ileocecal valve. The                            colonoscopy was performed without difficulty. The                            patient tolerated the procedure well. The quality                            of the bowel preparation was adequate. The                            ileocecal valve, appendiceal orifice, and rectum                            were photographed. Scope In: 3:49:49 PM Scope Out: 4:11:24 PM Scope Withdrawal Time: 0 hours 14 minutes 49 seconds  Total Procedure Duration: 0 hours 21 minutes 35 seconds  Findings:                 The perianal and digital rectal examinations were  normal.                           Seven sessile polyps were found in the rectum,                            transverse colon and ascending colon. The polyps                            were 4 to 8 mm in size. These polyps were removed                            with a cold snare. Resection and retrieval were                            complete.                           A 12 mm polyp was found in the rectum. The polyp                            was semi-pedunculated. The polyp was removed with a                            hot snare. Resection and retrieval were complete.                           Scattered small and large-mouthed diverticula were                            found in the sigmoid colon, descending colon,                            transverse colon and ascending colon.                           Non-bleeding external and  internal hemorrhoids were                            found during retroflexion. The hemorrhoids were                            large. Complications:            No immediate complications. Estimated Blood Loss:     Estimated blood loss was minimal. Impression:               - Seven 4 to 8 mm polyps in the rectum, in the                            transverse colon and in the ascending colon,                            removed with a cold snare. Resected and retrieved.                           -  One 12 mm polyp in the rectum, removed with a hot                            snare. Resected and retrieved.                           - Diverticulosis in the sigmoid colon, in the                            descending colon, in the transverse colon and in                            the ascending colon.                           - Non-bleeding external and internal hemorrhoids. Recommendation:           - Patient has a contact number available for                            emergencies. The signs and symptoms of potential                            delayed complications were discussed with the                            patient. Return to normal activities tomorrow.                            Written discharge instructions were provided to the                            patient.                           - Resume previous diet.                           - Continue present medications.                           - Await pathology results.                           - Repeat colonoscopy in 3 - 5 years for                            surveillance based on pathology results. Mauri Pole, MD 06/08/2021 4:36:38 PM This report has been signed electronically.

## 2021-06-11 ENCOUNTER — Encounter: Payer: Self-pay | Admitting: Gastroenterology

## 2021-06-12 ENCOUNTER — Telehealth: Payer: Self-pay | Admitting: *Deleted

## 2021-06-12 NOTE — Telephone Encounter (Signed)
°  Follow up Call-  Call back number 06/08/2021  Post procedure Call Back phone  # [phone]  Permission to leave phone message Yes  Some recent data might be hidden     Patient questions:  Do you have a fever, pain , or abdominal swelling? No. Pain Score  0 *  Have you tolerated food without any problems? Yes.    Have you been able to return to your normal activities? Yes.    Do you have any questions about your discharge instructions: Diet   No. Medications  No. Follow up visit  No.  Do you have questions or concerns about your Care? No.  Actions: * If pain score is 4 or above: No action needed, pain <4.  Have you developed a fever since your procedure? no  2.   Have you had an respiratory symptoms (SOB or cough) since your procedure? no  3.   Have you tested positive for COVID 19 since your procedure no  4.   Have you had any family members/close contacts diagnosed with the COVID 19 since your procedure?  no   If yes to any of these questions please route to Joylene John, RN and Joella Prince, RN

## 2021-06-20 ENCOUNTER — Encounter: Payer: Self-pay | Admitting: Gastroenterology

## 2021-06-20 ENCOUNTER — Ambulatory Visit (INDEPENDENT_AMBULATORY_CARE_PROVIDER_SITE_OTHER): Payer: Medicare PPO | Admitting: Gastroenterology

## 2021-06-20 DIAGNOSIS — K2100 Gastro-esophageal reflux disease with esophagitis, without bleeding: Secondary | ICD-10-CM

## 2021-06-20 DIAGNOSIS — K2270 Barrett's esophagus without dysplasia: Secondary | ICD-10-CM

## 2021-06-20 NOTE — Progress Notes (Signed)
Patient left office without being seen, he was upset about the policy by [facility] regarding mask usage.   ?

## 2021-08-09 ENCOUNTER — Encounter: Payer: Self-pay | Admitting: Gastroenterology

## 2021-10-26 ENCOUNTER — Encounter: Payer: Self-pay | Admitting: *Deleted

## 2021-10-26 DIAGNOSIS — Z006 Encounter for examination for normal comparison and control in clinical research program: Secondary | ICD-10-CM

## 2021-10-26 NOTE — Research (Signed)
I called patient about [employer]-Prevention Study. I spoke to wife about the research study. [place] would not be available for study visits. I thanked her for her time.

## 2021-11-15 ENCOUNTER — Other Ambulatory Visit: Payer: Self-pay | Admitting: Physician Assistant

## 2021-11-15 DIAGNOSIS — E782 Mixed hyperlipidemia: Secondary | ICD-10-CM

## 2022-02-26 ENCOUNTER — Other Ambulatory Visit: Payer: Self-pay | Admitting: Physician Assistant

## 2022-02-26 DIAGNOSIS — E782 Mixed hyperlipidemia: Secondary | ICD-10-CM

## 2022-05-11 ENCOUNTER — Other Ambulatory Visit: Payer: Self-pay | Admitting: Nurse Practitioner

## 2022-05-11 DIAGNOSIS — Z0000 Encounter for general adult medical examination without abnormal findings: Secondary | ICD-10-CM

## 2022-05-11 DIAGNOSIS — E782 Mixed hyperlipidemia: Secondary | ICD-10-CM

## 2022-05-11 DIAGNOSIS — R739 Hyperglycemia, unspecified: Secondary | ICD-10-CM

## 2022-05-14 IMAGING — CT CT CARDIAC CORONARY ARTERY CALCIUM SCORE
3 series · 14 of 20 positions shown, 16 images · non-contrast
Comparison: CT chest 03/02/2021 and 03/25/2019.
COMPARISON: CT chest 03/02/2021 and 03/25/2019.

Addendum:
EXAM:
OVER-READ INTERPRETATION  CT CHEST

The following report is an over-read performed by radiologist Dr.
Roseanne Espada [REDACTED] on 05/03/2021. This
over-read does not include interpretation of cardiac or coronary
anatomy or pathology. The coronary calcium score/coronary CTA
interpretation by the cardiologist is attached.
CLINICAL DATA: Cardiovascular Disease Risk stratification
Coronary Calcium Score
TECHNIQUE: A gated, non-contrast computed tomography scan of the heart was
performed using 3mm slice thickness. Axial images were analyzed on a
dedicated workstation. Calcium scoring of the coronary arteries was
performed using the Agatston method.

[Series 2: ax lung · axial · 0.92mm/px · z∈[+60,+172]mm · 5 of 86 slices shown]
[im 15/86  lung]
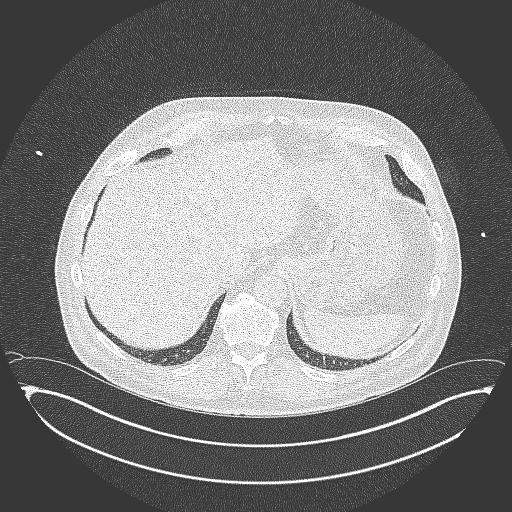
[im 29/86  lung]
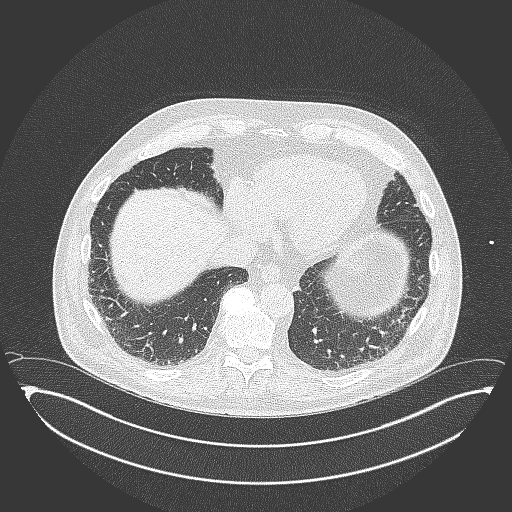
[im 43/86  lung]
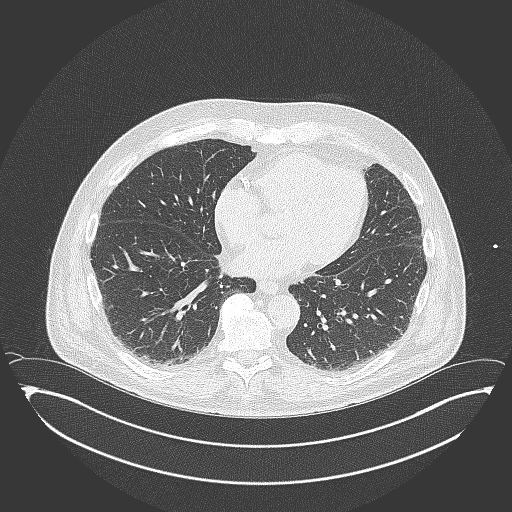
[im 57/86  lung]
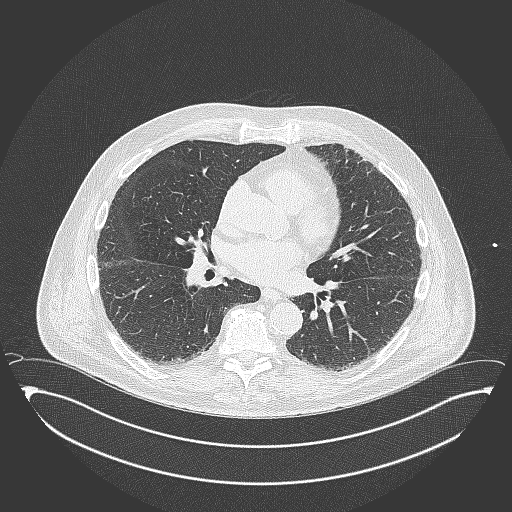
[im 71/86  lung]
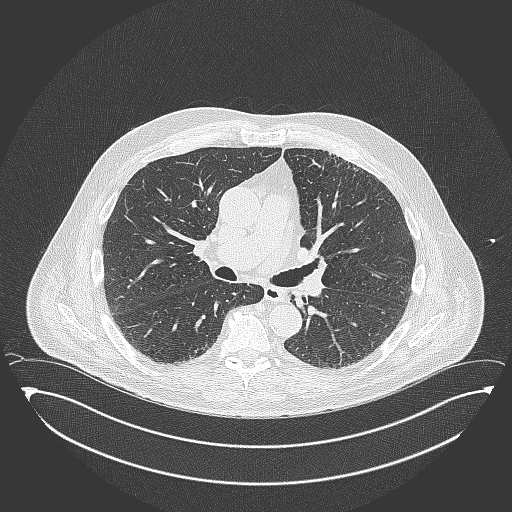

[Series 3: cascseq 3.0 sa36 70% (id) · axial · 0.39mm/px · z∈[+75,+159]mm · 3 of 57 slices shown]
[im 15/57  vessel]
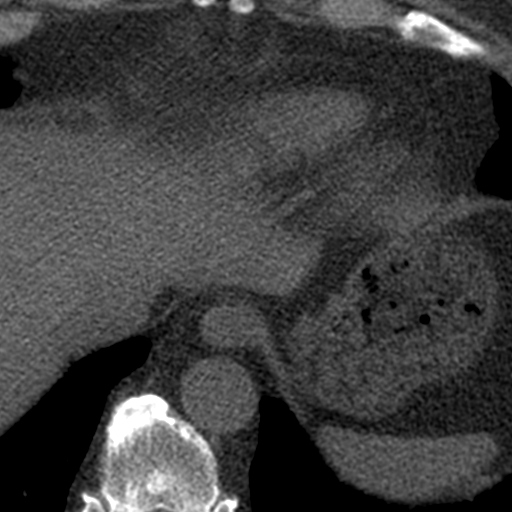
[im 29/57  vessel]
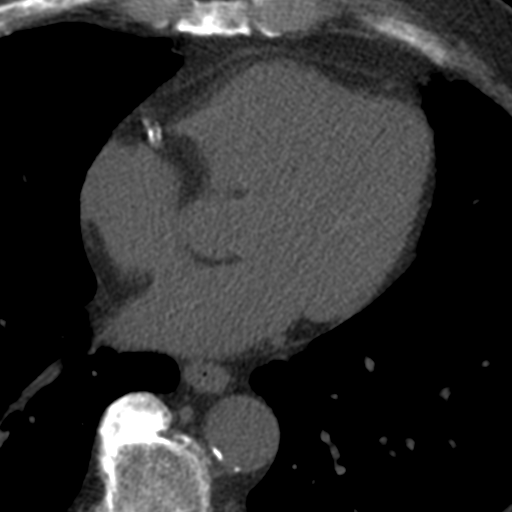
[im 43/57  vessel]
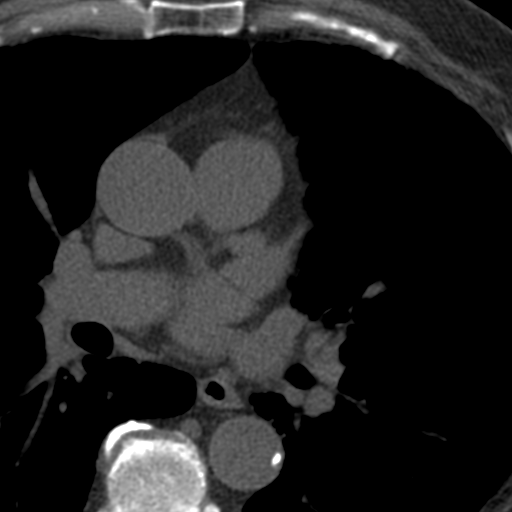

[Series 4: ax st · axial · 0.92mm/px · z∈[+56,+176]mm · 6 of 86 slices shown, 8 images]
[im 13/86  vessel]
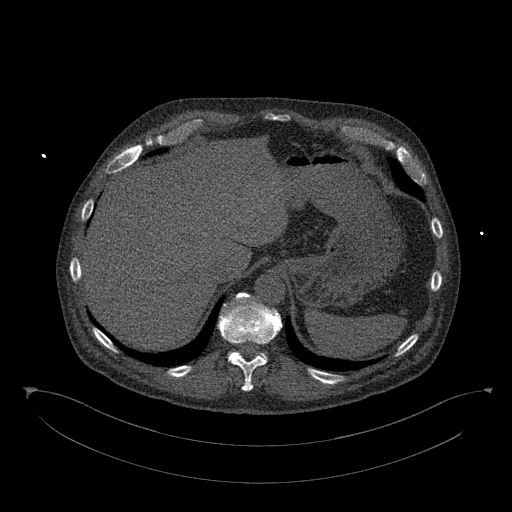
[im 13/86  lung]
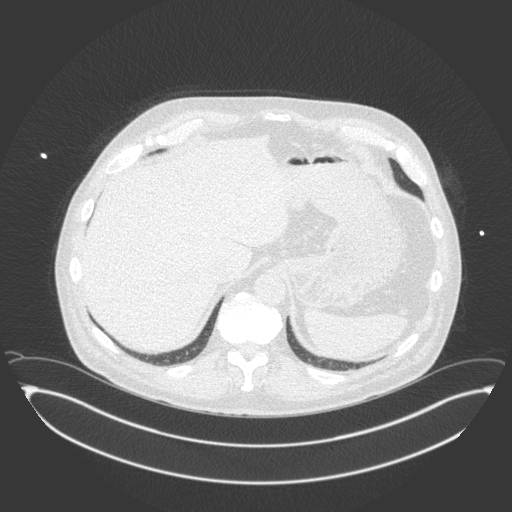
[im 25/86  vessel]
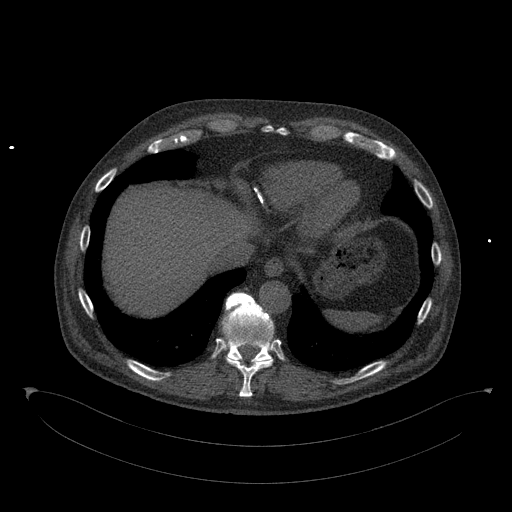
[im 37/86  vessel]
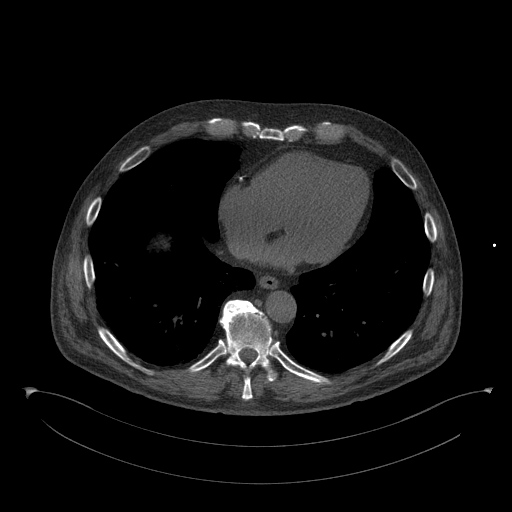
[im 49/86  vessel]
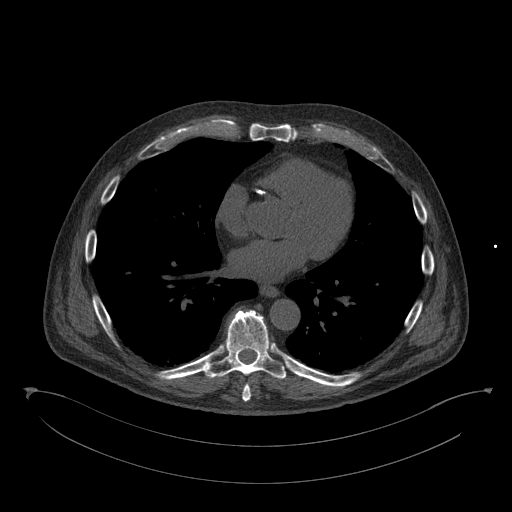
[im 61/86  vessel]
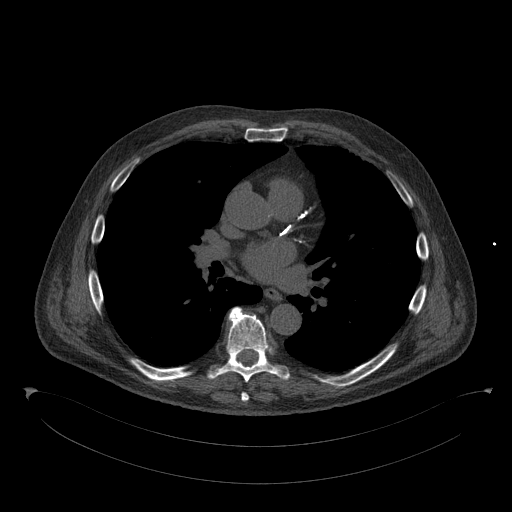
[im 61/86  lung]
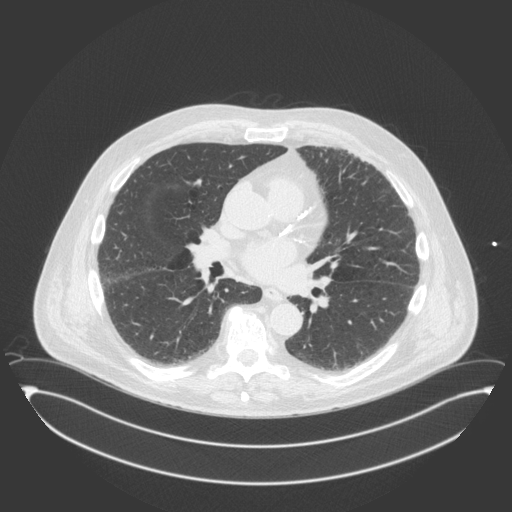
[im 73/86  vessel]
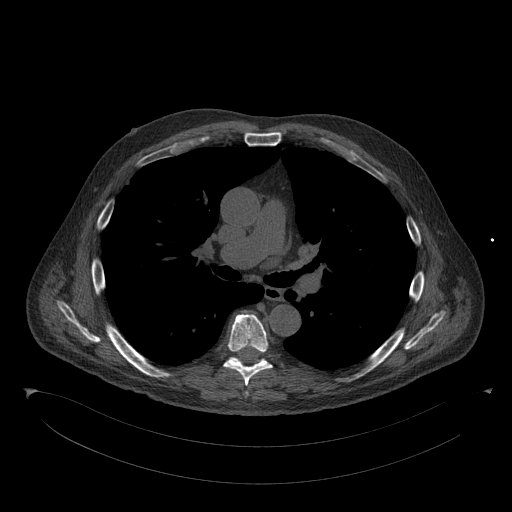

[14 of 20 positions shown; findings below may reference images not displayed]

FINDINGS: Vascular: Atherosclerotic calcification of the aorta.

Mediastinum/Nodes: None.

Lungs/Pleura: Centrilobular and paraseptal emphysema. Scattered
small subpleural nodules are unchanged from 03/25/2019, compatible
with benign subpleural lymph nodes. Minimal dependent atelectasis
bilaterally. Additional millimetric parenchymal pulmonary nodules
are also unchanged and benign. No pleural fluid.

Upper Abdomen: Visualized portions of the liver, spleen, pancreas
and stomach are grossly unremarkable.

Musculoskeletal: Degenerative changes in the spine.
IMPRESSION: 1. No acute extracardiac findings.
2.  Aortic atherosclerosis (QR3WX-ODQ.Q).
3.  Emphysema (QR3WX-DK9.0).
FINDINGS: Coronary arteries: Normal origins.

Coronary Calcium Score:

Left main: 124

Left anterior descending artery: 493

Left circumflex artery: 177

Right coronary artery: 939

Total: 5855

Percentile: 93

Pericardium: Normal.

Aorta: Normal to borderline dilated caliber of ascending aorta (38
mm). Aortic atherosclerosis noted.

Non-cardiac: See separate report from [REDACTED].
IMPRESSION: Coronary calcium score of 5855. This was 93rd percentile for age-,
race-, and sex-matched controls.



If CAC=0, it is reasonable to withhold statin therapy and reassess
in 5 to 10 years, as long as higher risk conditions are absent
(diabetes mellitus, family history of premature CHD in first degree
relatives (males <55 years; females <65 years), cigarette smoking,
or LDL >=190 mg/dL).

If CAC is 1 to 99, it is reasonable to initiate statin therapy for
patients >=55 years of age.

If CAC is >=100 or >=75th percentile, it is reasonable to initiate
statin therapy at any age.

Cardiology referral should be considered for patients with CAC
scores >=400 or >=75th percentile.

*4842 AHA/ACC/AACVPR/AAPA/ABC/YLDAZ/AMAZIGH/JUMPER/Ster/CHAI/ABRAHAMSON/TUMIRAN
Guideline on the Management of Blood Cholesterol: A Report of the
American College of Cardiology/American Heart Association Task Force
on Clinical Practice Guidelines. J Am Coll Cardiol.
3384;73(24):4374-4474.

*** End of Addendum ***
EXAM:
OVER-READ INTERPRETATION  CT CHEST

The following report is an over-read performed by radiologist Dr.
Roseanne Espada [REDACTED] on 05/03/2021. This
over-read does not include interpretation of cardiac or coronary
anatomy or pathology. The coronary calcium score/coronary CTA
interpretation by the cardiologist is attached.
FINDINGS: Vascular: Atherosclerotic calcification of the aorta.

Mediastinum/Nodes: None.

Lungs/Pleura: Centrilobular and paraseptal emphysema. Scattered
small subpleural nodules are unchanged from 03/25/2019, compatible
with benign subpleural lymph nodes. Minimal dependent atelectasis
bilaterally. Additional millimetric parenchymal pulmonary nodules
are also unchanged and benign. No pleural fluid.

Upper Abdomen: Visualized portions of the liver, spleen, pancreas
and stomach are grossly unremarkable.

Musculoskeletal: Degenerative changes in the spine.
IMPRESSION: 1. No acute extracardiac findings.
2.  Aortic atherosclerosis (QR3WX-ODQ.Q).
3.  Emphysema (QR3WX-DK9.0).

## 2022-06-04 ENCOUNTER — Encounter (HOSPITAL_BASED_OUTPATIENT_CLINIC_OR_DEPARTMENT_OTHER): Payer: Self-pay | Admitting: Emergency Medicine

## 2022-06-04 ENCOUNTER — Emergency Department (HOSPITAL_BASED_OUTPATIENT_CLINIC_OR_DEPARTMENT_OTHER)
Admission: EM | Admit: 2022-06-04 | Discharge: 2022-06-04 | Disposition: A | Discharge status: Home / Self Care | Payer: Medicare PPO | Attending: Emergency Medicine | Admitting: Emergency Medicine

## 2022-06-04 ENCOUNTER — Emergency Department (HOSPITAL_BASED_OUTPATIENT_CLINIC_OR_DEPARTMENT_OTHER): Payer: Medicare PPO | Admitting: Radiology

## 2022-06-04 ENCOUNTER — Other Ambulatory Visit (HOSPITAL_BASED_OUTPATIENT_CLINIC_OR_DEPARTMENT_OTHER): Payer: Self-pay

## 2022-06-04 ENCOUNTER — Other Ambulatory Visit: Payer: Self-pay

## 2022-06-04 DIAGNOSIS — Z20822 Contact with and (suspected) exposure to covid-19: Secondary | ICD-10-CM | POA: Diagnosis not present

## 2022-06-04 DIAGNOSIS — N189 Chronic kidney disease, unspecified: Secondary | ICD-10-CM | POA: Diagnosis not present

## 2022-06-04 DIAGNOSIS — J441 Chronic obstructive pulmonary disease with (acute) exacerbation: Secondary | ICD-10-CM | POA: Diagnosis not present

## 2022-06-04 DIAGNOSIS — J45909 Unspecified asthma, uncomplicated: Secondary | ICD-10-CM | POA: Diagnosis not present

## 2022-06-04 DIAGNOSIS — J101 Influenza due to other identified influenza virus with other respiratory manifestations: Secondary | ICD-10-CM | POA: Diagnosis not present

## 2022-06-04 DIAGNOSIS — R0602 Shortness of breath: Secondary | ICD-10-CM | POA: Diagnosis present

## 2022-06-04 LAB — CBC WITH DIFFERENTIAL/PLATELET
Abs Immature Granulocytes: 0.02 10*3/uL (ref 0.00–0.07)
Basophils Absolute: 0 10*3/uL (ref 0.0–0.1)
Basophils Relative: 0 %
Eosinophils Absolute: 0.3 10*3/uL (ref 0.0–0.5)
Eosinophils Relative: 3 %
HCT: 46.3 % (ref 39.0–52.0)
Hemoglobin: 16.1 g/dL (ref 13.0–17.0)
Immature Granulocytes: 0 %
Lymphocytes Relative: 30 %
Lymphs Abs: 2.4 10*3/uL (ref 0.7–4.0)
MCH: 30.7 pg (ref 26.0–34.0)
MCHC: 34.8 g/dL (ref 30.0–36.0)
MCV: 88.2 fL (ref 80.0–100.0)
Monocytes Absolute: 0.8 10*3/uL (ref 0.1–1.0)
Monocytes Relative: 10 %
Neutro Abs: 4.7 10*3/uL (ref 1.7–7.7)
Neutrophils Relative %: 57 %
Platelets: 208 10*3/uL (ref 150–400)
RBC: 5.25 MIL/uL (ref 4.22–5.81)
RDW: 12.8 % (ref 11.5–15.5)
WBC: 8.2 10*3/uL (ref 4.0–10.5)
nRBC: 0 % (ref 0.0–0.2)

## 2022-06-04 LAB — TROPONIN I (HIGH SENSITIVITY): Troponin I (High Sensitivity): 3 ng/L (ref <18)

## 2022-06-04 LAB — I-STAT VENOUS BLOOD GAS, ED
Acid-Base Excess: 1 mmol/L (ref 0.0–2.0)
Bicarbonate: 26.4 mmol/L (ref 20.0–28.0)
Calcium, Ion: 1.17 mmol/L (ref 1.15–1.40)
HCT: 42 % (ref 39.0–52.0)
Hemoglobin: 14.3 g/dL (ref 13.0–17.0)
O2 Saturation: 20 %
Patient temperature: 98.7
Potassium: 3.7 mmol/L (ref 3.5–5.1)
Sodium: 141 mmol/L (ref 135–145)
TCO2: 28 mmol/L (ref 22–32)
pCO2, Ven: 45 mmHg (ref 44–60)
pH, Ven: 7.376 (ref 7.25–7.43)
pO2, Ven: 16 mmHg — CL (ref 32–45)

## 2022-06-04 LAB — BASIC METABOLIC PANEL
Anion gap: 11 (ref 5–15)
BUN: 17 mg/dL (ref 8–23)
CO2: 26 mmol/L (ref 22–32)
Calcium: 10.2 mg/dL (ref 8.9–10.3)
Chloride: 103 mmol/L (ref 98–111)
Creatinine, Ser: 0.85 mg/dL (ref 0.61–1.24)
GFR, Estimated: >60 mL/min (ref >60)
Glucose, Bld: 100 mg/dL — ABNORMAL HIGH (ref 70–99)
Potassium: 4 mmol/L (ref 3.5–5.1)
Sodium: 140 mmol/L (ref 135–145)

## 2022-06-04 LAB — RESP PANEL BY RT-PCR (RSV, FLU A&B, COVID)  RVPGX2
Influenza A by PCR: NEGATIVE
Influenza B by PCR: POSITIVE — AB
Resp Syncytial Virus by PCR: NEGATIVE
SARS Coronavirus 2 by RT PCR: NEGATIVE

## 2022-06-04 MED ORDER — PREDNISONE 20 MG PO TABS
40.0000 mg | ORAL_TABLET | Freq: Every day | ORAL | 0 refills | Status: AC
  Filled 2022-06-04: qty 8, 4d supply, fill #0

## 2022-06-04 MED ORDER — OXYMETAZOLINE HCL 0.05 % NA SOLN
1.0000 | Freq: Once | NASAL | Status: AC
  Administered 2022-06-04: 1 via NASAL
  Filled 2022-06-04: qty 30

## 2022-06-04 MED ORDER — METHYLPREDNISOLONE SODIUM SUCC 125 MG IJ SOLR
125.0000 mg | Freq: Once | INTRAMUSCULAR | Status: AC
  Administered 2022-06-04: 125 mg via INTRAVENOUS
  Filled 2022-06-04: qty 2

## 2022-06-04 MED ORDER — IPRATROPIUM-ALBUTEROL 0.5-2.5 (3) MG/3ML IN SOLN
3.0000 mL | Freq: Once | RESPIRATORY_TRACT | Status: AC
  Administered 2022-06-04: 3 mL via RESPIRATORY_TRACT
  Filled 2022-06-04: qty 3

## 2022-06-04 MED ORDER — ALBUTEROL SULFATE (2.5 MG/3ML) 0.083% IN NEBU
2.5000 mg | INHALATION_SOLUTION | Freq: Once | RESPIRATORY_TRACT | Status: AC
  Administered 2022-06-04: 2.5 mg via RESPIRATORY_TRACT
  Filled 2022-06-04: qty 3

## 2022-06-04 MED ORDER — ALBUTEROL SULFATE HFA 108 (90 BASE) MCG/ACT IN AERS
2.0000 | INHALATION_SPRAY | Freq: Once | RESPIRATORY_TRACT | Status: AC
  Administered 2022-06-04: 2 via RESPIRATORY_TRACT
  Filled 2022-06-04: qty 6.7

## 2022-06-04 NOTE — ED Provider Notes (Signed)
[facility] Provider Note   CSN: [id number] Arrival date & time: 06/04/22  0941     History  Chief Complaint  Patient presents with   Cough   Shortness of Breath    Brandon Schultz is a 72 y.o. male.  With PMH of COPD, asthma, CKD,  HLD who presents with cough and shortness of breath x 1 week.  Patient has been having cough productive of yellow sputum for the past week more than his baseline cough as well as dyspnea on exertion.  He has had also associated congestion and significant rhinorrhea.  He has been using over-the-counter medications without relief.  He does not use any breathing treatments at home.  He denies any active chest pain at rest but notes some mild substernal nonradiating chest pain with coughing fits.  He has had no vomiting or diarrhea.  Denies any history of PE, DVT, CHF.  No leg pain or swelling.  He has had no fevers with the symptoms.  HPI     Home Medications Prior to Admission medications   Medication Sig Start Date End Date Taking? Authorizing Provider  predniSONE (DELTASONE) 20 MG tablet Take 2 tablets (40 mg total) by mouth daily for 4 days. 06/05/22 06/09/22 Yes Elgie Congo, MD  diltiazem 2 % GEL Using your index finger, you should apply a small amount of medication inside the rectum up to your first knuckle/joint 3 to 4 times daily x 6 to 8 weeks. 03/31/21   Mauri Pole, MD  pantoprazole (PROTONIX) 40 MG tablet Take 1 tablet (40 mg total) by mouth 2 (two) times daily. 06/08/21   Mauri Pole, MD  polyethylene glycol (MIRALAX) 17 g packet 1 - 2 capfuls daily 03/31/21   Nandigam, Venia Minks, MD  rosuvastatin (CRESTOR) 20 MG tablet TAKE 1 TABLET (20 MG TOTAL) BY MOUTH DAILY. 02/26/22   Lorrene Reid, PA-C  timolol (TIMOPTIC) 0.5 % ophthalmic solution Place 1 drop into the left eye every morning. 11/21/20   [provider]  Wheat Dextrin (BENEFIBER) POWD 1 tablespoons three times daily  with meals 03/31/21   Mauri Pole, MD      Allergies    Patient has no known allergies.    Review of Systems   Review of Systems  Physical Exam Updated Vital Signs BP 131/73 (BP Location: Left Arm)   Pulse 69   Temp 97.8 F (36.6 C) (Oral)   Resp 20   Ht 5' 11"$  (1.803 m)   Wt 97.5 kg   SpO2 97%   BMI 29.99 kg/m  Physical Exam Constitutional: Alert and oriented.  No acute distress, nontoxic eyes: Conjunctivae are normal. ENT      Head: Normocephalic and atraumatic.      Nose: No congestion.      Mouth/Throat: Mucous membranes are moist.      Neck: No stridor. Cardiovascular: S1, S2, regular rate and rhythm Respiratory: Normal respiratory effort.  Mild rhonchi and transmitted upper airway sounds mixed with faint end expiratory wheeze.  Good aeration throughout all lung fields.  Intermittent coarse cough on exam Gastrointestinal: Soft and nontender.  Musculoskeletal: Normal range of motion in all extremities.      Right lower leg: No tenderness or edema.      Left lower leg: No tenderness or edema. Neurologic: Normal speech and language. No gross focal neurologic deficits are appreciated. Skin: Skin is warm, dry and intact. No rash noted. Psychiatric: Mood and affect  are normal. Speech and behavior are normal.  ED Results / Procedures / Treatments   Labs (all labs ordered are listed, but only abnormal results are displayed) Labs Reviewed  RESP PANEL BY RT-PCR (RSV, FLU A&B, COVID)  RVPGX2 - Abnormal; Notable for the following components:      Result Value   Influenza B by PCR POSITIVE (*)    All other components within normal limits  BASIC METABOLIC PANEL - Abnormal; Notable for the following components:   Glucose, Bld 100 (*)    All other components within normal limits  I-STAT VENOUS BLOOD GAS, ED - Abnormal; Notable for the following components:   pO2, Ven 16 (*)    All other components within normal limits  CBC WITH DIFFERENTIAL/PLATELET  TROPONIN I (HIGH  SENSITIVITY)  TROPONIN I (HIGH SENSITIVITY)    EKG EKG Interpretation  Date/Time:  Monday June 04 2022 10:34:56 EST Ventricular Rate:  75 PR Interval:  166 QRS Duration: 96 QT Interval:  413 QTC Calculation: 462 R Axis:   6 Text Interpretation: Sinus rhythm No significant change since last tracing Confirmed by Georgina Snell [phone]) on 06/04/2022 10:37:12 AM  Radiology DG Chest 2 View  Result Date: 06/04/2022 CLINICAL DATA:  Cough for 4 days.  Former smoker. EXAM: CHEST - 2 VIEW COMPARISON:  07/04/2008 FINDINGS: The heart size and mediastinal contours are within normal limits. Both lungs are clear. Interstitial changes. Chronic. The visualized skeletal structures are unremarkable. Overlapping cardiac leads. Degenerative changes of the spine on lateral view. IMPRESSION: No active cardiopulmonary disease. Electronically Signed   By: Jill Side M.D.   On: 06/04/2022 11:28    Procedures Procedures  Remain on constant cardiac monitoring sinus rhythm with normal rates.  Medications Ordered in ED Medications  albuterol (PROVENTIL) (2.5 MG/3ML) 0.083% nebulizer solution 2.5 mg (2.5 mg Nebulization Given 06/04/22 1011)  ipratropium-albuterol (DUONEB) 0.5-2.5 (3) MG/3ML nebulizer solution 3 mL (3 mLs Nebulization Given 06/04/22 1011)  methylPREDNISolone sodium succinate (SOLU-MEDROL) 125 mg/2 mL injection 125 mg (125 mg Intravenous Given 06/04/22 1050)  oxymetazoline (AFRIN) 0.05 % nasal spray 1 spray (1 spray Each Nare Given 06/04/22 1051)  albuterol (VENTOLIN HFA) 108 (90 Base) MCG/ACT inhaler 2 puff (2 puffs Inhalation Given 06/04/22 1228)    ED Course/ Medical Decision Making/ A&P Clinical Course as of 06/04/22 [place] Jun 04, 2022  1231 Patient's labs reviewed by me.  High sensitive troponin 3 and reassuring, doubt ACS.  No active chest pain.  Creatinine 0.85 within normal limits.  White blood cell count 8.2 within normal limits.  VBG without hypercarbia pH 7.376, pCO2 45.  Chest  x-ray reviewed by me no evidence of pneumonia.  Read by radiology no acute cardiopulmonary findings.  Patient is influenza B positive.  This is consistent with his symptoms that he presents with today.  He has no further wheezing and improved symptoms after DuoNeb and nasal Afrin and steroids.  Will discharge patient with 4 more days of steroids, take, albuterol inhaler and advise close follow-up with PCP with return precautions.  Patient and his wife are in agreement with plan.  He feels improved. [VB]    Clinical Course User Index [VB] Elgie Congo, MD   {                            Medical Decision Making  HAIDER CARRERA is a 72 y.o. male.  With PMH of COPD, asthma, CKD,  HLD who presents with cough and shortness of breath x 1 week.   Patient presented with rhonchi mixed with faint end expiratory wheeze in the setting of known emphysema with viral URI-like symptoms.  Suspect mild reactive airway disease exacerbation in the setting of likely viral URI versus bronchitis versus bacterial pneumonia.  EKG obtained sinus rhythm with no acute ST/T changes from prior, no concern for ACS.  Patient's labs reviewed by me.  High sensitive troponin 3 and reassuring, doubt ACS.  No active chest pain.  Creatinine 0.85 within normal limits.  White blood cell count 8.2 within normal limits.  VBG without hypercarbia pH 7.376, pCO2 45.  Chest x-ray reviewed by me no evidence of pneumonia.  Read by radiology no acute cardiopulmonary findings.  Patient is influenza B positive.  This is consistent with his symptoms that he presents with today.  He has no further wheezing and improved symptoms after DuoNeb and nasal Afrin and steroids.  Will discharge patient with 4 more days of steroids, take, albuterol inhaler and advise close follow-up with PCP with return precautions.  Patient and his wife are in agreement with plan.  He feels improved.   Amount and/or Complexity of Data Reviewed Labs:  ordered. Radiology: ordered.  Risk OTC drugs. Prescription drug management.    Final Clinical Impression(s) / ED Diagnoses Final diagnoses:  Influenza B  COPD exacerbation ([place])    Rx / DC Orders ED Discharge Orders          Ordered    predniSONE (DELTASONE) 20 MG tablet  Daily        06/04/22 1204              Elgie Congo, MD 06/04/22 1233

## 2022-06-04 NOTE — Discharge Instructions (Signed)
You were seen in the emergency department today for an COPD exacerbation and Influenza B infection. You were given breathing treatments and your first dose of steroids here. You were prescribed additional steroids (prednisone) to take during the following 4 days to help your symptoms improve. Please take this as directed.  Continue to use your albuterol 2 puffs every 4-6 hours as needed for wheezing, shortness of breath.  Please make an appointment with your primary care doctor within one week to assure improvement or resolution in symptoms.  If your breathing worsens and does not improve with your inhaler, you are feeling severe chest pain, fainting, or any other symptoms concerning to you, present to the ED or call 911.

## 2022-06-04 NOTE — ED Notes (Signed)
Patient verbalizes understanding of discharge instructions. Opportunity for questioning and answers were provided. Patient discharged from ED.  °

## 2022-06-04 NOTE — ED Triage Notes (Signed)
Pt arrived POV with cough x5-6 days ago and SOB. Afebrile. Pt also states having some pain in his back, but has DDD. States back pain could be worse due to coughing.

## 2022-06-06 NOTE — Progress Notes (Signed)
Cardiology Clinic Note   Patient Name: Brandon Schultz Date of Encounter: 06/08/2022  Primary Care Provider:  Velva Harman, PA Primary Cardiologist:  Donato Heinz, MD  Patient Profile    Brandon Schultz 72 year old male presents the clinic today for follow-up evaluation of his hyperlipidemia.  Past Medical History    Past Medical History:  Diagnosis Date   Allergy    seasonal   Arthritis    Asthma    Cataract    Phreesia 05/31/2020   Chronic kidney disease    Phreesia 05/31/2020   Colon polyps    Colon polyps    COPD (chronic obstructive pulmonary disease) (HCC)    Diverticulitis    Emphysema    Emphysema of lung ([place])    Phreesia 05/31/2020   GERD (gastroesophageal reflux disease)    Glaucoma    Phreesia 05/31/2020   Gum disease    H/O degenerative disc disease    Hemorrhoids    Hyperlipidemia    Phreesia 05/31/2020   Inguinal hernia    Melanoma ([place])    facial   Renal disease    Bright's Disease-childhood   Syncope    Umbilical hernia    Urinary tract infection    Past Surgical History:  Procedure Laterality Date   cataract surgery Bilateral 11/2020   COLONOSCOPY  06/08/2021   07/27/2016   DENTAL SURGERY     HERNIA REPAIR     Umbilical & Inguinal   left knee surgery     MELANOMA EXCISION     facial   neck and shoulder injection     POLYPECTOMY     WISDOM TOOTH EXTRACTION      Allergies  No Known Allergies  History of Present Illness    Brandon Schultz is a PMH of COPD, CKD, GERD, asthma, HLD, and melanoma.  He was initially referred by his PCP for evaluation of coronary artery disease and hyperlipidemia.  His coronary calcium score of 1/23 was 1733 which placed him in the 93rd percentile.  He reported intermittent chest discomfort.  He was seen by Dr. Gardiner Rhyme 05/08/2021.  He reported pressure in the center of his chest that would occur 2 times per week.  It was not related to exertion.  He did note dyspnea on  exertion.  He reported history of syncopal episodes, with recurrences for several years.  He denied vasovagal syncope.  He denied lower extremity swelling and palpitations.  He had smoked 2 packs/day for 40 years but quit in 2016.  He indicated that his father had died of MI in his late 46s or early 10s.  Nuclear stress test was ordered and showed low risk, EF 55-65%, and no ischemia 05/10/2021.  He presents to the clinic today for follow-up evaluation and states he continues to do well.  His biggest complaint at this time is with his back and neck pain.  He was seen by GI and had colonoscopy/endoscopy.  He was diagnosed with Barrett's esophagus.  He was placed on pantoprazole.  He reports that he has run out of his pantoprazole.  I will refill 20 mg of pantoprazole daily.  We reviewed his previous calcium scoring and nuclear stress test.  He and his wife expressed understanding.  We reviewed the importance of heart healthy low-sodium diet and cholesterol-lowering medication.  He will have labs drawn in 2 weeks with his PCP.  I will request fasting lipids at that time.  We will plan follow-up in 12  months.  I will give the salty 6 diet sheet and have him increase physical activity as tolerated.  Today he denies chest pain, shortness of breath, lower extremity edema, fatigue, palpitations, melena, hematuria, hemoptysis, diaphoresis, weakness, presyncope, syncope, orthopnea, and PND.  Home Medications    Prior to Admission medications   Medication Sig Start Date End Date Taking? Authorizing Provider  diltiazem 2 % GEL Using your index finger, you should apply a small amount of medication inside the rectum up to your first knuckle/joint 3 to 4 times daily x 6 to 8 weeks. 03/31/21   Mauri Pole, MD  pantoprazole (PROTONIX) 40 MG tablet Take 1 tablet (40 mg total) by mouth 2 (two) times daily. 06/08/21   Mauri Pole, MD  polyethylene glycol (MIRALAX) 17 g packet 1 - 2 capfuls daily 03/31/21    Nandigam, Venia Minks, MD  predniSONE (DELTASONE) 20 MG tablet Take 2 tablets (40 mg total) by mouth daily for 4 days. 06/05/22 06/09/22  Elgie Congo, MD  rosuvastatin (CRESTOR) 20 MG tablet TAKE 1 TABLET (20 MG TOTAL) BY MOUTH DAILY. 02/26/22   Lorrene Reid, PA-C  timolol (TIMOPTIC) 0.5 % ophthalmic solution Place 1 drop into the left eye every morning. 11/21/20   [provider]  Wheat Dextrin (BENEFIBER) POWD 1 tablespoons three times daily with meals 03/31/21   Mauri Pole, MD    Family History    Family History  Problem Relation Age of Onset   Colon cancer Mother    Heart attack Father        Deceased-Early 58s   Syncope episode Sister    Healthy Sister        x1   Dementia Maternal Grandmother    Cancer Maternal Grandfather    Healthy Daughter        x2   Healthy Son        x2   Colon polyps Neg Hx    Rectal cancer Neg Hx    Stomach cancer Neg Hx    He indicated that his mother is deceased. He indicated that his father is alive. He indicated that two of his four sisters are alive. He indicated that the status of his maternal grandmother is unknown. He indicated that the status of his maternal grandfather is unknown. He indicated that the status of his daughter is unknown. He indicated that the status of his son is unknown. He indicated that the status of his neg hx is unknown.  Social History    Social History   Socioeconomic History   Marital status: Married    Spouse name: Not on file   Number of children: 4   Years of education: 12   Highest education level: Not on file  Occupational History   Occupation: Disability  Tobacco Use   Smoking status: Former    Packs/day: 2.00    Years: 40.00    Total pack years: 80.00    Types: Cigarettes    Quit date: 04/16/2014    Years since quitting: 8.1   Smokeless tobacco: Former  Scientific laboratory technician Use: Never used  Substance and Sexual Activity   Alcohol use: Yes    Alcohol/week: 14.0 standard  drinks of alcohol    Types: 14 Cans of beer per week    Comment: 2 beers a day maybe    Drug use: No   Sexual activity: Yes    Birth control/protection: None  Other Topics Concern   Not on  file  Social History Narrative   Not on file   Social Determinants of Health   Financial Resource Strain: Not on file  Food Insecurity: Not on file  Transportation Needs: Not on file  Physical Activity: Not on file  Stress: Not on file  Social Connections: Not on file  Intimate Partner Violence: Not on file     Review of Systems    General:  No chills, fever, night sweats or weight changes.  Cardiovascular:  No chest pain, dyspnea on exertion, edema, orthopnea, palpitations, paroxysmal nocturnal dyspnea. Dermatological: No rash, lesions/masses Respiratory: No cough, dyspnea Urologic: No hematuria, dysuria Abdominal:   No nausea, vomiting, diarrhea, bright red blood per rectum, melena, or hematemesis Neurologic:  No visual changes, wkns, changes in mental status. All other systems reviewed and are otherwise negative except as noted above.  Physical Exam    VS:  BP 132/70 (BP Location: Left Arm, Patient Position: Sitting, Cuff Size: Normal)   Pulse 62   Ht '5\' 10"'$  (1.778 m)   Wt 215 lb 6.4 oz (97.7 kg)   SpO2 98%   BMI 30.91 kg/m  , BMI Body mass index is 30.91 kg/m. GEN: Well nourished, well developed, in no acute distress. HEENT: normal. Neck: Supple, no JVD, carotid bruits, or masses. Cardiac: RRR, no murmurs, rubs, or gallops. No clubbing, cyanosis, edema.  Radials/DP/PT 2+ and equal bilaterally.  Respiratory:  Respirations regular and unlabored, clear to auscultation bilaterally. GI: Soft, nontender, nondistended, BS + x 4. MS: no deformity or atrophy. Skin: warm and dry, no rash. Neuro:  Strength and sensation are intact. Psych: Normal affect.  Accessory Clinical Findings    Recent Labs: 06/04/2022: BUN 17; Creatinine, Ser 0.85; Hemoglobin 14.3; Platelets 208; Potassium  3.7; Sodium 141   Recent Lipid Panel    Component Value Date/Time   CHOL 145 06/02/2021 0857   TRIG 80 06/02/2021 0857   HDL 49 06/02/2021 0857   CHOLHDL 3.0 06/02/2021 0857   CHOLHDL 5 03/29/2016 1013   VLDL 25.0 03/29/2016 1013   LDLCALC 81 06/02/2021 0857         ECG personally reviewed by me today-none today.  Recent EKG reviewed.  Echocardiogram 05/22/2021 IMPRESSIONS     1. Left ventricular ejection fraction, by estimation, is 60 to 65%. The  left ventricle has normal function. The left ventricle has no regional  wall motion abnormalities. Left ventricular diastolic parameters were  normal. The average left ventricular  global longitudinal strain is -21.3 %.   2. Right ventricular systolic function is normal. The right ventricular  size is normal. Tricuspid regurgitation signal is inadequate for assessing  PA pressure.   3. The mitral valve is normal in structure. No evidence of mitral valve  regurgitation. No evidence of mitral stenosis.   4. The aortic valve is tricuspid. Aortic valve regurgitation is not  visualized. No aortic stenosis is present.   5. Aortic dilatation noted. There is mild dilatation of the aortic root,  measuring 40 mm.   6. The inferior vena cava is normal in size with <50% respiratory  variability, suggesting right atrial pressure of 8 mmHg.   FINDINGS   Left Ventricle: Left ventricular ejection fraction, by estimation, is 60  to 65%. The left ventricle has normal function. The left ventricle has no  regional wall motion abnormalities. The average left ventricular global  longitudinal strain is -21.3 %. 3D   left ventricular ejection fraction analysis performed but not reported  based on interpreter judgement due  to suboptimal tracking. The left  ventricular internal cavity size was normal in size. There is no left  ventricular hypertrophy. Left ventricular  diastolic parameters were normal.   Right Ventricle: The right ventricular size  is normal. No increase in  right ventricular wall thickness. Right ventricular systolic function is  normal. Tricuspid regurgitation signal is inadequate for assessing PA  pressure.   Left Atrium: Left atrial size was normal in size.   Right Atrium: Right atrial size was normal in size.   Pericardium: There is no evidence of pericardial effusion.   Mitral Valve: The mitral valve is normal in structure. No evidence of  mitral valve regurgitation. No evidence of mitral valve stenosis.   Tricuspid Valve: The tricuspid valve is normal in structure. Tricuspid  valve regurgitation is trivial.   Aortic Valve: The aortic valve is tricuspid. Aortic valve regurgitation is  not visualized. No aortic stenosis is present.   Pulmonic Valve: The pulmonic valve was not well visualized. Pulmonic valve  regurgitation is not visualized.   Aorta: Aortic dilatation noted. There is mild dilatation of the aortic  root, measuring 40 mm.   Venous: The inferior vena cava is normal in size with less than 50%  respiratory variability, suggesting right atrial pressure of 8 mmHg.   IAS/Shunts: The interatrial septum was not well visualized.    Nuclear stress test 05/10/2021    The study is normal. The study is low risk.   Patient exercised according to the [facility] protocol for 8:33mn achieving 10.0 METs   Target HR was achieved (144bpm; 95% MPHR)   LV perfusion is normal. There is no evidence of ischemia. There is no evidence of infarction.   Left ventricular function is normal. Nuclear stress EF: 61 %. The left ventricular ejection fraction is normal (55-65%). End diastolic cavity size is normal.   Prior study not available for comparison.  Assessment & Plan   1.  Coronary artery disease-no chest pain today.  Denies recent episodes of arm neck or back discomfort.  Denies exertional chest pain.  Coronary calcium score 1/23 showed 1733 placing the 93rd percentile.  Underwent subsequent nuclear stress test  which showed low risk and no ischemia. Continue rosuvastatin Heart healthy low-sodium diet-salty 6 given Increase physical activity as tolerated  Hyperlipidemia-LDL 81 06/02/21 Heart healthy low-sodium high-fiber diet Continue rosuvastatin Increase physical activity as tolerated  Syncope-denies recent episodes of lightheadedness, presyncope or syncope Maintain p.o. hydration Change system slowly  GERD-well-controlled with pantoprazole Continue pantoprazole GERD diet instructions given  Disposition: Follow-up with Dr. SGardiner Rhymeor me in 12 months.   JJossie Ng Joevanni Roddey Schultz     06/08/2022, 11:11 AM [place] Office [phone] [phone]   I spent 14 minutes examining this patient, reviewing medications, and using patient centered shared decision making involving her cardiac care.  Prior to her visit I spent greater than 20 minutes reviewing her past medical history,  medications, and prior cardiac tests.

## 2022-06-08 ENCOUNTER — Ambulatory Visit: Payer: Medicare PPO | Attending: General Practice | Admitting: General Practice

## 2022-06-08 ENCOUNTER — Encounter: Payer: Self-pay | Admitting: General Practice

## 2022-06-08 VITALS — BP 132/70 | HR 62 | Ht 70.0 in | Wt 215.4 lb

## 2022-06-08 DIAGNOSIS — K219 Gastro-esophageal reflux disease without esophagitis: Secondary | ICD-10-CM

## 2022-06-08 DIAGNOSIS — E785 Hyperlipidemia, unspecified: Secondary | ICD-10-CM | POA: Diagnosis not present

## 2022-06-08 DIAGNOSIS — K2970 Gastritis, unspecified, without bleeding: Secondary | ICD-10-CM

## 2022-06-08 DIAGNOSIS — R931 Abnormal findings on diagnostic imaging of heart and coronary circulation: Secondary | ICD-10-CM | POA: Diagnosis not present

## 2022-06-08 DIAGNOSIS — R55 Syncope and collapse: Secondary | ICD-10-CM | POA: Diagnosis not present

## 2022-06-08 DIAGNOSIS — R1013 Epigastric pain: Secondary | ICD-10-CM

## 2022-06-08 MED ORDER — PANTOPRAZOLE SODIUM 40 MG PO TBEC: 40.0000 mg | DELAYED_RELEASE_TABLET | Freq: Two times a day (BID) | ORAL | 4 refills | Status: DC

## 2022-06-08 NOTE — Patient Instructions (Signed)
Medication Instructions:  The current medical regimen is effective;  continue present plan and medications as directed. Please refer to the Current Medication list given to you today.  *If you need a refill on your cardiac medications before your next appointment, please call your pharmacy*  Lab Work: PLEASE DO YOUR FASTING LAB WITH YOUR PRIMARY MD AND HAVE THEM FAX TO [place] AT [phone] If you have labs (blood work) drawn today and your tests are completely normal, you will receive your results only by: [place] (if you have MyChart) OR  A paper copy in the mail If you have any lab test that is abnormal or we need to change your treatment, we will call you to review the results.  Testing/Procedures: NONE  Follow-Up: At [facility], you and your health needs are our priority.  As part of our continuing mission to provide you with exceptional heart care, we have created designated Provider Care Teams.  These Care Teams include your primary Cardiologist (physician) and Advanced Practice Providers (APPs -  Physician Assistants and Nurse Practitioners) who all work together to provide you with the care you need, when you need it.  Your next appointment:   12 month(s)  Provider:   Donato Heinz, MD     Other Instructions INCREASE PHYSICAL ACTIVITY-AS TOLERATED  PLEASE READ AND FOLLOW ATTACHED  SALTY 6

## 2022-06-11 ENCOUNTER — Telehealth: Payer: Self-pay

## 2022-06-11 NOTE — Telephone Encounter (Signed)
     Patient  visit on 2/19  at [facility]    Have you been able to follow up with your primary care physician? Yes   The patient was or was not able to obtain any needed medicine or equipment. Yes   Are there diet recommendations that you are having difficulty following? Na   Patient expresses understanding of discharge instructions and education provided has no other needs at this time.  Yes      [facility] [id number], [place], [place] [address] Phone: [phone] Email: [email or web address]

## 2022-06-13 ENCOUNTER — Other Ambulatory Visit: Payer: Self-pay

## 2022-06-13 DIAGNOSIS — Z0000 Encounter for general adult medical examination without abnormal findings: Secondary | ICD-10-CM

## 2022-06-13 DIAGNOSIS — E782 Mixed hyperlipidemia: Secondary | ICD-10-CM

## 2022-06-13 DIAGNOSIS — R739 Hyperglycemia, unspecified: Secondary | ICD-10-CM

## 2022-06-14 ENCOUNTER — Other Ambulatory Visit: Payer: Medicare PPO

## 2022-06-14 DIAGNOSIS — Z0000 Encounter for general adult medical examination without abnormal findings: Secondary | ICD-10-CM

## 2022-06-14 DIAGNOSIS — E782 Mixed hyperlipidemia: Secondary | ICD-10-CM

## 2022-06-14 DIAGNOSIS — R739 Hyperglycemia, unspecified: Secondary | ICD-10-CM

## 2022-06-15 LAB — CBC
Hematocrit: 46.2 % (ref 37.5–51.0)
Hemoglobin: 15.3 g/dL (ref 13.0–17.7)
MCH: 29.8 pg (ref 26.6–33.0)
MCHC: 33.1 g/dL (ref 31.5–35.7)
MCV: 90 fL (ref 79–97)
Platelets: 339 10*3/uL (ref 150–450)
RBC: 5.13 x10E6/uL (ref 4.14–5.80)
RDW: 12.7 % (ref 11.6–15.4)
WBC: 12.1 10*3/uL — ABNORMAL HIGH (ref 3.4–10.8)

## 2022-06-15 LAB — COMPREHENSIVE METABOLIC PANEL
ALT: 36 IU/L (ref 0–44)
AST: 18 IU/L (ref 0–40)
Albumin/Globulin Ratio: 1.9 (ref 1.2–2.2)
Albumin: 4.3 g/dL (ref 3.9–4.9)
Alkaline Phosphatase: 55 IU/L (ref 44–121)
BUN/Creatinine Ratio: 23 (ref 10–24)
BUN: 21 mg/dL (ref 8–27)
Bilirubin Total: 0.5 mg/dL (ref 0.0–1.2)
CO2: 24 mmol/L (ref 20–29)
Calcium: 9.7 mg/dL (ref 8.6–10.2)
Chloride: 103 mmol/L (ref 96–106)
Creatinine, Ser: 0.92 mg/dL (ref 0.76–1.27)
Globulin, Total: 2.3 g/dL (ref 1.5–4.5)
Glucose: 95 mg/dL (ref 70–99)
Potassium: 4.6 mmol/L (ref 3.5–5.2)
Sodium: 140 mmol/L (ref 134–144)
Total Protein: 6.6 g/dL (ref 6.0–8.5)
eGFR: 89 mL/min/{1.73_m2} (ref >59)

## 2022-06-15 LAB — LIPID PANEL
Chol/HDL Ratio: 3.5 ratio (ref 0.0–5.0)
Cholesterol, Total: 152 mg/dL (ref 100–199)
HDL: 44 mg/dL (ref >39)
LDL Chol Calc (NIH): 82 mg/dL (ref 0–99)
Triglycerides: 149 mg/dL (ref 0–149)
VLDL Cholesterol Cal: 26 mg/dL (ref 5–40)

## 2022-06-15 LAB — HEMOGLOBIN A1C
Est. average glucose Bld gHb Est-mCnc: 126 mg/dL
Hgb A1c MFr Bld: 6 % — ABNORMAL HIGH (ref 4.8–5.6)

## 2022-06-15 LAB — TSH: TSH: 0.695 u[IU]/mL (ref 0.450–4.500)

## 2022-06-21 ENCOUNTER — Ambulatory Visit (INDEPENDENT_AMBULATORY_CARE_PROVIDER_SITE_OTHER): Payer: Medicare PPO | Admitting: Family Medicine

## 2022-06-21 ENCOUNTER — Encounter: Payer: Self-pay | Admitting: Family Medicine

## 2022-06-21 VITALS — BP 124/74 | HR 58 | Resp 20 | Ht 70.0 in | Wt 215.0 lb

## 2022-06-21 DIAGNOSIS — R052 Subacute cough: Secondary | ICD-10-CM | POA: Diagnosis not present

## 2022-06-21 DIAGNOSIS — Z0000 Encounter for general adult medical examination without abnormal findings: Secondary | ICD-10-CM | POA: Diagnosis not present

## 2022-06-21 DIAGNOSIS — Z122 Encounter for screening for malignant neoplasm of respiratory organs: Secondary | ICD-10-CM

## 2022-06-21 MED ORDER — GUAIFENESIN ER 600 MG PO TB12: 600.0000 mg | ORAL_TABLET | Freq: Two times a day (BID) | ORAL | 0 refills | Status: AC

## 2022-06-21 MED ORDER — BENZONATATE 100 MG PO CAPS: 100.0000 mg | ORAL_CAPSULE | Freq: Three times a day (TID) | ORAL | 0 refills | Status: AC | PRN

## 2022-06-21 NOTE — Patient Instructions (Addendum)
I have sent the prescriptions in for 2 medicines for you to try.  Try the Mucinex (guaifenesin) first.  Make sure you are drinking lots of water while on this medication for it to work properly.  If you find that after a few days of taking it it is not helpful, then you can switch to the Tessalon (benzonatate).

## 2022-06-21 NOTE — Progress Notes (Deleted)
Subjective:   Brandon Schultz is a 72 y.o. male who presents for Medicare Annual/Subsequent preventive examination.  Review of Systems    *** Cardiac Risk Factors include: none     Objective:    Today's Vitals   06/21/22 1317  BP: 124/74  Pulse: (!) 58  Resp: 20  SpO2: 97%  Weight: 215 lb (97.5 kg)  Height: '5\' 10"'$  (1.778 m)  PainSc: 5    Body mass index is 30.85 kg/m.     06/04/2022   10:45 AM 07/27/2016    8:43 AM 07/06/2016   10:56 AM  Advanced Directives  Does Patient Have a Medical Advance Directive? No Yes Yes  Type of Corporate treasurer of [employer];Living will [place];Living will  Would patient like information on creating a medical advance directive? No - Patient declined      Current Medications (verified) Outpatient Encounter Medications as of 06/21/2022  Medication Sig   pantoprazole (PROTONIX) 40 MG tablet Take 1 tablet (40 mg total) by mouth 2 (two) times daily.   polyethylene glycol (MIRALAX) 17 g packet 1 - 2 capfuls daily   rosuvastatin (CRESTOR) 20 MG tablet TAKE 1 TABLET (20 MG TOTAL) BY MOUTH DAILY.   timolol (TIMOPTIC) 0.5 % ophthalmic solution Place 1 drop into the left eye every morning.   Wheat Dextrin (BENEFIBER) POWD 1 tablespoons three times daily with meals   diltiazem 2 % GEL Using your index finger, you should apply a small amount of medication inside the rectum up to your first knuckle/joint 3 to 4 times daily x 6 to 8 weeks. (Patient not taking: Reported on 06/08/2022)   No facility-administered encounter medications on file as of 06/21/2022.    Allergies (verified) Patient has no known allergies.   History: Past Medical History:  Diagnosis Date   Allergy    seasonal   Arthritis    Asthma    Cataract    Phreesia 05/31/2020   Chronic kidney disease    Phreesia 05/31/2020   Colon polyps    Colon polyps    COPD (chronic obstructive pulmonary disease) (HCC)    Diverticulitis    Emphysema     Emphysema of lung ([place])    Phreesia 05/31/2020   GERD (gastroesophageal reflux disease)    Glaucoma    Phreesia 05/31/2020   Gum disease    H/O degenerative disc disease    Hemorrhoids    Hyperlipidemia    Phreesia 05/31/2020   Inguinal hernia    Melanoma ([place])    facial   Renal disease    Bright's Disease-childhood   Syncope    Umbilical hernia    Urinary tract infection    Past Surgical History:  Procedure Laterality Date   cataract surgery Bilateral 11/2020   COLONOSCOPY  06/08/2021   07/27/2016   DENTAL SURGERY     HERNIA REPAIR     Umbilical & Inguinal   left knee surgery     MELANOMA EXCISION     facial   neck and shoulder injection     POLYPECTOMY     WISDOM TOOTH EXTRACTION     Family History  Problem Relation Age of Onset   Colon cancer Mother    Heart attack Father        Deceased-Early 60s   Syncope episode Sister    Healthy Sister        x1   Dementia Maternal Grandmother    Cancer Maternal Grandfather  Healthy Daughter        x2   Healthy Son        x2   Colon polyps Neg Hx    Rectal cancer Neg Hx    Stomach cancer Neg Hx    Social History   Socioeconomic History   Marital status: Married    Spouse name: Not on file   Number of children: 4   Years of education: 12   Highest education level: Not on file  Occupational History   Occupation: Disability  Tobacco Use   Smoking status: Former    Packs/day: 2.00    Years: 40.00    Total pack years: 80.00    Types: Cigarettes    Quit date: 04/16/2014    Years since quitting: 8.1   Smokeless tobacco: Former  Scientific laboratory technician Use: Never used  Substance and Sexual Activity   Alcohol use: Yes    Alcohol/week: 14.0 standard drinks of alcohol    Types: 14 Cans of beer per week    Comment: 2 beers a day maybe    Drug use: No   Sexual activity: Yes    Birth control/protection: None  Other Topics Concern   Not on file  Social History Narrative   Not on file   Social Determinants of  Health   Financial Resource Strain: Low Risk  (06/21/2022)   Overall Financial Resource Strain (CARDIA)    Difficulty of Paying Living Expenses: Not hard at all  Food Insecurity: No Food Insecurity (06/21/2022)   Hunger Vital Sign    Worried About Running Out of Food in the Last Year: Never true    Ran Out of Food in the Last Year: Never true  Transportation Needs: No Transportation Needs (06/21/2022)   PRAPARE - Hydrologist (Medical): No    Lack of Transportation (Non-Medical): No  Physical Activity: Sufficiently Active (06/21/2022)   Exercise Vital Sign    Days of Exercise per Week: 7 days    Minutes of Exercise per Session: 60 min  Stress: No Stress Concern Present (06/21/2022)   [place]    Feeling of Stress : Not at all  Social Connections: Socially Isolated (06/21/2022)   Social Connection and Isolation Panel [NHANES]    Frequency of Communication with Friends and Family: Once a week    Frequency of Social Gatherings with Friends and Family: Once a week    Attends Religious Services: Never    Marine scientist or Organizations: No    Attends Music therapist: Never    Marital Status: Married    Tobacco Counseling Counseling given: Not Answered   Clinical Intake:  Pre-visit preparation completed: No  Pain : No/denies pain Pain Score: 5  Pain Location: Other (Comment) (neck, back, knees)           Diabetic?***         Activities of Daily Living    06/21/2022    1:27 PM  In your present state of health, do you have any difficulty performing the following activities:  Hearing? 1  Vision? 1  Difficulty concentrating or making decisions? 0  Walking or climbing stairs? 0  Dressing or bathing? 0  Doing errands, shopping? 0  Preparing Food and eating ? N  Using the Toilet? N  In the past six months, have you accidently leaked urine? N  Do you have  problems with loss of  bowel control? N  Managing your Medications? N  Managing your Finances? N  Housekeeping or managing your Housekeeping? N    Patient Care Team: Velva Harman, PA as PCP - General (Family Medicine) Donato Heinz, MD as PCP - Cardiology (Cardiology) [place], Jennefer Bravo, MD as Referring Physician (Dermatology)  Indicate any recent Medical Services you may have received from other than [facility] providers in the past year (date may be approximate).     Assessment:   This is a routine wellness examination for Brandon Schultz.  Hearing/Vision screen Hearing Screening - Comments:: Followed by ENT Vision Screening - Comments:: Followed by [place] issues and exercise activities discussed: Current Exercise Habits: Home exercise routine, Type of exercise: walking, Time (Minutes): 20, Frequency (Times/Week): 7, Weekly Exercise (Minutes/Week): 140, Intensity: Moderate, Exercise limited by: None identified   Goals Addressed   None   Depression Screen    06/21/2022    1:29 PM 06/05/2021    8:44 AM 06/03/2020    9:17 AM 03/26/2019    8:20 AM 03/05/2019    8:35 AM 05/20/2018   10:32 AM 03/27/2018   11:12 AM  PHQ 2/9 Scores  PHQ - 2 Score 0 0 0 0 0 0 0  PHQ- 9 Score  0 0 3 0 1 1    Fall Risk    06/21/2022    1:27 PM 06/05/2021    8:44 AM 06/03/2020    9:17 AM 03/26/2019    8:19 AM 03/27/2018   11:12 AM  Fall Risk   Falls in the past year? 1 0 1 0 0  Number falls in past yr: 0 0 0    Injury with Fall? 0 0 0    Risk for fall due to :  No Fall Risks     Follow up  Falls evaluation completed Falls evaluation completed Falls evaluation completed     [place]:  Any stairs in or around the home? {YES/NO:21197} If so, are there any without handrails? {YES/NO:21197} Home free of loose throw rugs in walkways, pet beds, electrical cords, etc? {YES/NO:21197} Adequate lighting in your home to reduce risk of falls?  {YES/NO:21197}  ASSISTIVE DEVICES UTILIZED TO PREVENT FALLS:  Life alert? {YES/NO:21197} Use of a cane, walker or w/c? {YES/NO:21197} Grab bars in the bathroom? {YES/NO:21197} Shower chair or bench in shower? {YES/NO:21197} Elevated toilet seat or a handicapped toilet? {YES/NO:21197}  TIMED UP AND GO:  Was the test performed? {YES/NO:21197}.  Length of time to ambulate 10 feet: *** sec.   {Appearance of GA:4730917  Cognitive Function:        06/21/2022    1:26 PM 06/05/2021    8:38 AM 06/03/2020    9:17 AM 03/26/2019    8:17 AM  6CIT Screen  What Year? 0 points 0 points 4 points 0 points  What month? 0 points 0 points 0 points 0 points  What time? 0 points 0 points 0 points 3 points  Count back from 20 0 points 0 points 0 points 2 points  Months in reverse 0 points 4 points 4 points 4 points  Repeat phrase 0 points 0 points 0 points 4 points  Total Score 0 points 4 points 8 points 13 points    Immunizations Immunization History  Administered Date(s) Administered   Influenza, High Dose Seasonal PF 01/28/2019   Influenza,inj,quad, With Preservative 01/30/2018   Influenza-Unspecified 02/24/2016, 01/20/2020, 02/16/2021, 02/20/2022   PFIZER(Purple Top)SARS-COV-2 Vaccination 05/21/2019, 06/15/2019, 02/02/2020  Pneumococcal Conjugate-13 02/18/2013, 02/19/2015   Pneumococcal Polysaccharide-23 04/02/2018   Tdap 04/16/2016   Zoster Recombinat (Shingrix) 04/02/2018, 06/25/2018   Zoster, Live 02/19/2015    {TDAP status:2101805}  {Flu Vaccine status:2101806}  {Pneumococcal vaccine status:2101807}  {Covid-19 vaccine status:2101808}  Qualifies for Shingles Vaccine? {YES/NO:21197}  Zostavax completed {YES/NO:21197}  {Shingrix Completed?:2101804}  Screening Tests Health Maintenance  Topic Date Due   COVID-19 Vaccine (4 - 2023-24 season) 12/15/2021   Lung Cancer Screening  03/02/2022   Medicare Annual Wellness (AWV)  06/21/2023   COLONOSCOPY (Pts 45-1yr Insurance  coverage will need to be confirmed)  06/08/2024   DTaP/Tdap/Td (2 - Td or Tdap) 04/16/2026   Pneumonia Vaccine 72 Years old  Completed   INFLUENZA VACCINE  Completed   Hepatitis C Screening  Completed   Zoster Vaccines- Shingrix  Completed   HPV VACCINES  Aged Out    Health Maintenance  Health Maintenance Due  Topic Date Due   COVID-19 Vaccine (4 - 2023-24 season) 12/15/2021   Lung Cancer Screening  03/02/2022    {Colorectal cancer screening:2101809}  Lung Cancer Screening: (Low Dose CT Chest recommended if Age 71-80years, 30 pack-year currently smoking OR have quit w/in 15years.) {DOES NOT does:27190::"does not"} qualify.   Lung Cancer Screening Referral: ***  Additional Screening:  Hepatitis C Screening: {DOES NOT does:27190::"does not"} qualify; Completed ***  Vision Screening: Recommended annual ophthalmology exams for early detection of glaucoma and other disorders of the eye. Is the patient up to date with their annual eye exam?  {YES/NO:21197} Who is the provider or what is the name of the office in which the patient attends annual eye exams? *** If pt is not established with a provider, would they like to be referred to a provider to establish care? {YES/NO:21197}.   Dental Screening: Recommended annual dental exams for proper oral hygiene  Community Resource Referral / Chronic Care Management: CRR required this visit?  {YES/NO:21197}  CCM required this visit?  {YES/NO:21197}     Plan:     I have personally reviewed and noted the following in the patient's chart:   Medical and social history Use of alcohol, tobacco or illicit drugs  Current medications and supplements including opioid prescriptions. {Opioid Prescriptions:[id number]} Functional ability and status Nutritional status Physical activity Advanced directives List of other physicians Hospitalizations, surgeries, and ER visits in previous 12 months Vitals Screenings to include cognitive,  depression, and falls Referrals and appointments  In addition, I have reviewed and discussed with patient certain preventive protocols, quality metrics, and best practice recommendations. A written personalized care plan for preventive services as well as general preventive health recommendations were provided to patient.     MVelva Harman PA   06/21/2022   Nurse Notes: ***

## 2022-06-21 NOTE — Progress Notes (Signed)
Subjective:   Brandon Schultz is a 72 y.o. male who presents for Medicare Annual/Subsequent preventive examination.  His only concern today is that he still has a cough from when he tested positive for influenza B.  It has been 2 weeks and is gradually improving but is still present.  Review of Systems    Review of Systems  Respiratory:  Positive for cough.   Musculoskeletal:  Positive for back pain, joint pain and neck pain.  All other systems reviewed and are negative.   Cardiac Risk Factors include: none     Objective:    Today's Vitals   06/21/22 1317 06/21/22 1333  BP: 124/74   Pulse: (!) 58   Resp: 20   SpO2: 97%   Weight: 215 lb (97.5 kg)   Height: '5\' 10"'$  (1.778 m)   PainSc: 5  5    Body mass index is 30.85 kg/m.     06/04/2022   10:45 AM 07/27/2016    8:43 AM 07/06/2016   10:56 AM  Advanced Directives  Does Patient Have a Medical Advance Directive? No Yes Yes  Type of Corporate treasurer of [place];Living will [place];Living will  Would patient like information on creating a medical advance directive? No - Patient declined      Current Medications (verified) Outpatient Encounter Medications as of 06/21/2022  Medication Sig   benzonatate (TESSALON) 100 MG capsule Take 1 capsule (100 mg total) by mouth 3 (three) times daily as needed for up to 7 days for cough.   guaiFENesin (MUCINEX) 600 MG 12 hr tablet Take 1 tablet (600 mg total) by mouth 2 (two) times daily for 14 days. For cough.  Drink lots of water while taking this medicine.   pantoprazole (PROTONIX) 40 MG tablet Take 1 tablet (40 mg total) by mouth 2 (two) times daily.   polyethylene glycol (MIRALAX) 17 g packet 1 - 2 capfuls daily   rosuvastatin (CRESTOR) 20 MG tablet TAKE 1 TABLET (20 MG TOTAL) BY MOUTH DAILY.   timolol (TIMOPTIC) 0.5 % ophthalmic solution Place 1 drop into the left eye every morning.   Wheat Dextrin (BENEFIBER) POWD 1 tablespoons three times daily  with meals   diltiazem 2 % GEL Using your index finger, you should apply a small amount of medication inside the rectum up to your first knuckle/joint 3 to 4 times daily x 6 to 8 weeks. (Patient not taking: Reported on 06/08/2022)   No facility-administered encounter medications on file as of 06/21/2022.    Allergies (verified) Patient has no known allergies.   History: Past Medical History:  Diagnosis Date   Allergy    seasonal   Arthritis    Asthma    Cataract    Phreesia 05/31/2020   Chronic kidney disease    Phreesia 05/31/2020   Colon polyps    Colon polyps    COPD (chronic obstructive pulmonary disease) (HCC)    Diverticulitis    Emphysema    Emphysema of lung ([place])    Phreesia 05/31/2020   GERD (gastroesophageal reflux disease)    Glaucoma    Phreesia 05/31/2020   Gum disease    H/O degenerative disc disease    Hemorrhoids    Hyperlipidemia    Phreesia 05/31/2020   Inguinal hernia    Melanoma ([place])    facial   Renal disease    Bright's Disease-childhood   Syncope    Umbilical hernia    Urinary tract infection  Past Surgical History:  Procedure Laterality Date   cataract surgery Bilateral 11/2020   COLONOSCOPY  06/08/2021   07/27/2016   DENTAL SURGERY     HERNIA REPAIR     Umbilical & Inguinal   left knee surgery     MELANOMA EXCISION     facial   neck and shoulder injection     POLYPECTOMY     WISDOM TOOTH EXTRACTION     Family History  Problem Relation Age of Onset   Colon cancer Mother    Heart attack Father        Deceased-Early 41s   Syncope episode Sister    Healthy Sister        x1   Dementia Maternal Grandmother    Cancer Maternal Grandfather    Healthy Daughter        x2   Healthy Son        x2   Colon polyps Neg Hx    Rectal cancer Neg Hx    Stomach cancer Neg Hx    Social History   Socioeconomic History   Marital status: Married    Spouse name: Not on file   Number of children: 4   Years of education: 12   Highest  education level: Not on file  Occupational History   Occupation: Disability  Tobacco Use   Smoking status: Former    Packs/day: 2.00    Years: 40.00    Total pack years: 80.00    Types: Cigarettes    Quit date: 04/16/2014    Years since quitting: 8.1   Smokeless tobacco: Former  Scientific laboratory technician Use: Never used  Substance and Sexual Activity   Alcohol use: Yes    Alcohol/week: 14.0 standard drinks of alcohol    Types: 14 Cans of beer per week    Comment: 2 beers a day maybe    Drug use: No   Sexual activity: Yes    Birth control/protection: None  Other Topics Concern   Not on file  Social History Narrative   Not on file   Social Determinants of Health   Financial Resource Strain: Low Risk  (06/21/2022)   Overall Financial Resource Strain (CARDIA)    Difficulty of Paying Living Expenses: Not hard at all  Food Insecurity: No Food Insecurity (06/21/2022)   Hunger Vital Sign    Worried About Running Out of Food in the Last Year: Never true    Ran Out of Food in the Last Year: Never true  Transportation Needs: No Transportation Needs (06/21/2022)   PRAPARE - Hydrologist (Medical): No    Lack of Transportation (Non-Medical): No  Physical Activity: Sufficiently Active (06/21/2022)   Exercise Vital Sign    Days of Exercise per Week: 7 days    Minutes of Exercise per Session: 60 min  Stress: No Stress Concern Present (06/21/2022)   [place]    Feeling of Stress : Not at all  Social Connections: Socially Isolated (06/21/2022)   Social Connection and Isolation Panel [NHANES]    Frequency of Communication with Friends and Family: Once a week    Frequency of Social Gatherings with Friends and Family: Once a week    Attends Religious Services: Never    Marine scientist or Organizations: No    Attends Archivist Meetings: Never    Marital Status: Married    Tobacco  Counseling Counseling given:  Not Answered   Clinical Intake:  Pre-visit preparation completed: No  Pain : 0-10 Pain Score: 5  Pain Type: Chronic pain Pain Location: Other (Comment) Pain Orientation: Upper, Mid, Lower Pain Descriptors / Indicators: Aching, Throbbing, Constant Pain Onset: More than a month ago Pain Frequency: Constant     BMI - recorded: 30.85 Nutritional Status: BMI > 30  Obese Nutritional Risks: None Diabetes: No  How often do you need to have someone help you when you read instructions, pamphlets, or other written materials from your doctor or pharmacy?: 1 - Never  Diabetic? No  Interpreter Needed?: No  Information entered by :: Leanord Asal, RMA   Activities of Daily Living    06/21/2022    1:27 PM  In your present state of health, do you have any difficulty performing the following activities:  Hearing? 1  Vision? 1  Difficulty concentrating or making decisions? 0  Walking or climbing stairs? 0  Dressing or bathing? 0  Doing errands, shopping? 0  Preparing Food and eating ? N  Using the Toilet? N  In the past six months, have you accidently leaked urine? N  Do you have problems with loss of bowel control? N  Managing your Medications? N  Managing your Finances? N  Housekeeping or managing your Housekeeping? N    Patient Care Team: Velva Harman, PA as PCP - General (Family Medicine) Donato Heinz, MD as PCP - Cardiology (Cardiology) [place], Jennefer Bravo, MD as Referring Physician (Dermatology)  Indicate any recent Medical Services you may have received from other than [facility] providers in the past year (date may be approximate).     Assessment:   This is a routine wellness examination for Santi.  Hearing/Vision screen Hearing Screening - Comments:: Followed by ENT Vision Screening - Comments:: Followed by [facility] issues and exercise activities discussed: Current Exercise Habits: Home exercise  routine, Type of exercise: walking, Time (Minutes): 20, Frequency (Times/Week): 7, Weekly Exercise (Minutes/Week): 140, Intensity: Moderate, Exercise limited by: None identified   Goals Addressed   None   Depression Screen    06/21/2022    1:29 PM 06/05/2021    8:44 AM 06/03/2020    9:17 AM 03/26/2019    8:20 AM 03/05/2019    8:35 AM 05/20/2018   10:32 AM 03/27/2018   11:12 AM  PHQ 2/9 Scores  PHQ - 2 Score 0 0 0 0 0 0 0  PHQ- 9 Score  0 0 3 0 1 1    Fall Risk    06/21/2022    1:27 PM 06/05/2021    8:44 AM 06/03/2020    9:17 AM 03/26/2019    8:19 AM 03/27/2018   11:12 AM  Fall Risk   Falls in the past year? 1 0 1 0 0  Number falls in past yr: 0 0 0    Injury with Fall? 0 0 0    Risk for fall due to :  No Fall Risks     Follow up  Falls evaluation completed Falls evaluation completed Falls evaluation completed     [place]:  Any stairs in or around the home? Yes  If so, are there any without handrails? No  Home free of loose throw rugs in walkways, pet beds, electrical cords, etc? Yes  Adequate lighting in your home to reduce risk of falls? Yes   ASSISTIVE DEVICES UTILIZED TO PREVENT FALLS:  Life alert? No  Use of  a cane, walker or w/c? No  Grab bars in the bathroom? No  Shower chair or bench in shower? No  Elevated toilet seat or a handicapped toilet? No   TIMED UP AND GO:  Was the test performed? Yes .  Length of time to ambulate 10 feet: 10-15 sec.   Gait steady and fast without use of assistive device  Cognitive Function:        06/21/2022    1:26 PM 06/05/2021    8:38 AM 06/03/2020    9:17 AM 03/26/2019    8:17 AM  6CIT Screen  What Year? 0 points 0 points 4 points 0 points  What month? 0 points 0 points 0 points 0 points  What time? 0 points 0 points 0 points 3 points  Count back from 20 0 points 0 points 0 points 2 points  Months in reverse 0 points 4 points 4 points 4 points  Repeat phrase 0 points 0 points 0 points 4  points  Total Score 0 points 4 points 8 points 13 points    Immunizations Immunization History  Administered Date(s) Administered   Influenza, High Dose Seasonal PF 01/28/2019   Influenza,inj,quad, With Preservative 01/30/2018   Influenza-Unspecified 02/24/2016, 01/20/2020, 02/16/2021, 02/20/2022   PFIZER(Purple Top)SARS-COV-2 Vaccination 05/21/2019, 06/15/2019, 02/02/2020   Pneumococcal Conjugate-13 02/18/2013, 02/19/2015   Pneumococcal Polysaccharide-23 04/02/2018   Tdap 04/16/2016   Zoster Recombinat (Shingrix) 04/02/2018, 06/25/2018   Zoster, Live 02/19/2015    TDAP status: Up to date  Flu Vaccine status: Up to date  Pneumococcal vaccine status: Up to date  Covid-19 vaccine status: Information provided on how to obtain vaccines.   Qualifies for Shingles Vaccine? Yes   Zostavax completed Yes   Shingrix Completed?: Yes  Screening Tests Health Maintenance  Topic Date Due   COVID-19 Vaccine (4 - 2023-24 season) 12/15/2021   Lung Cancer Screening  03/02/2022   Medicare Annual Wellness (AWV)  06/21/2023   COLONOSCOPY (Pts 45-76yr Insurance coverage will need to be confirmed)  06/08/2024   DTaP/Tdap/Td (2 - Td or Tdap) 04/16/2026   Pneumonia Vaccine 72 Years old  Completed   INFLUENZA VACCINE  Completed   Hepatitis C Screening  Completed   Zoster Vaccines- Shingrix  Completed   HPV VACCINES  Aged Out    Health Maintenance  Health Maintenance Due  Topic Date Due   COVID-19 Vaccine (4 - 2023-24 season) 12/15/2021   Lung Cancer Screening  03/02/2022    Colorectal cancer screening: Type of screening: Colonoscopy. Completed 06/08/2021. Repeat every 3-5 years  Lung Cancer Screening: (Low Dose CT Chest recommended if Age 71-80years, 30 pack-year currently smoking OR have quit w/in 15years.) does qualify.   Lung Cancer Screening Referral: 06/21/2022  Additional Screening:  Hepatitis C Screening: does not qualify.  Vision Screening: Recommended annual  ophthalmology exams for early detection of glaucoma and other disorders of the eye. Is the patient up to date with their annual eye exam?  Yes  Who is the provider or what is the name of the office in which the patient attends annual eye exams? WBelle ChasseIf pt is not established with a provider, would they like to be referred to a provider to establish care?  Established .   Dental Screening: Recommended annual dental exams for proper oral hygiene  Community Resource Referral / Chronic Care Management: CRR required this visit?  No   CCM required this visit?  No      Plan:    Recommended continuing  supportive care including adequate hydration.  Also gave him prescriptions for Mucinex and Tessalon Perles.  Recommended trial of Mucinex with lots of fluids to start since he describes the cough as mostly productive.  If he finds that this is ineffective, at that point I recommended Tessalon Perles to reduce the cough reflex. Sent in referral for annual low-dose chest CT for lung cancer screening.  I have personally reviewed and noted the following in the patient's chart:   Medical and social history Use of alcohol, tobacco or illicit drugs  Current medications and supplements including opioid prescriptions. Patient is not currently taking opioid prescriptions. Functional ability and status Nutritional status Physical activity Advanced directives List of other physicians Hospitalizations, surgeries, and ER visits in previous 12 months Vitals Screenings to include cognitive, depression, and falls Referrals and appointments  In addition, I have reviewed and discussed with patient certain preventive protocols, quality metrics, and best practice recommendations. A written personalized care plan for preventive services as well as general preventive health recommendations were provided to patient.    Follow-up in 6 months with fasting labs for hyperlipidemia.  Velva Harman, PA   06/21/2022   Nurse Notes: Face to face 20 minutes

## 2022-06-29 ENCOUNTER — Ambulatory Visit
Admission: RE | Admit: 2022-06-29 | Discharge: 2022-06-29 | Disposition: A | Discharge status: Home / Self Care | Payer: Medicare PPO | Source: Ambulatory Visit | Attending: Family Medicine | Admitting: Family Medicine

## 2022-06-29 DIAGNOSIS — Z122 Encounter for screening for malignant neoplasm of respiratory organs: Secondary | ICD-10-CM

## 2022-07-02 ENCOUNTER — Telehealth: Payer: Self-pay

## 2022-07-02 ENCOUNTER — Other Ambulatory Visit: Payer: Self-pay | Admitting: Family Medicine

## 2022-07-02 DIAGNOSIS — R911 Solitary pulmonary nodule: Secondary | ICD-10-CM

## 2022-07-02 DIAGNOSIS — Z87891 Personal history of nicotine dependence: Secondary | ICD-10-CM

## 2022-07-02 NOTE — Telephone Encounter (Signed)
Results received, patient was contacted and I reviewed the results with him.  Placed referral to pulmonology and forwarded results to his cardiologist as well.

## 2022-07-02 NOTE — Telephone Encounter (Signed)
[facility] called office to verify that CT of Lungs have been received, please advise, thanks.

## 2022-07-06 ENCOUNTER — Encounter: Payer: Self-pay | Admitting: Emergency Medicine

## 2022-07-06 ENCOUNTER — Ambulatory Visit: Payer: Medicare PPO | Admitting: Emergency Medicine

## 2022-07-06 VITALS — BP 122/72 | HR 73 | Temp 98.2°F | Ht 70.0 in | Wt 224.4 lb

## 2022-07-06 DIAGNOSIS — J449 Chronic obstructive pulmonary disease, unspecified: Secondary | ICD-10-CM | POA: Diagnosis not present

## 2022-07-06 DIAGNOSIS — R911 Solitary pulmonary nodule: Secondary | ICD-10-CM | POA: Insufficient documentation

## 2022-07-06 NOTE — Assessment & Plan Note (Signed)
Presumed COPD based on symptoms and emphysema on his imaging.  He will need pulmonary function testing at some point going forward.  He is leaving town so we cannot arrange for these now

## 2022-07-06 NOTE — Progress Notes (Signed)
Subjective:    Patient ID: Brandon Schultz, male    DOB: Aug 08, 1950, 72 y.o.   MRN: [id number]  HPI 72 year old former smoker (24 pack years), history of COPD, allergic rhinitis, CKD, GERD. He has participated in the lung cancer screening program with CT scans dating back to 03/2018.  He is referred today to discuss his most recent screening scan done 06/29/2022 as below.  He reports that he has been dealing w URI sx, cough, mucous. Dx with flu B. He was not rx w Abx. He is still dealing w some cough and sputum. He was treated w Steroids and was given albuterol. Unclear whether the albuterol helped him.  He is fairly active but is limited but DJD in the back and knees.   Lung cancer screening CT chest 06/29/2022 reviewed by me shows no mediastinal or hilar adenopathy, overall stability of scattered small pulmonary nodules.  There is a new posterior right upper lobe macrolobulated nodular area, slightly spiculated margins and some internal air bronchograms not seen on his prior   Review of Systems As per HPI  Past Medical History:  Diagnosis Date   Allergy    seasonal   Arthritis    Asthma    Cataract    Phreesia 05/31/2020   Chronic kidney disease    Phreesia 05/31/2020   Colon polyps    Colon polyps    COPD (chronic obstructive pulmonary disease) (HCC)    Diverticulitis    Emphysema    Emphysema of lung ([place])    Phreesia 05/31/2020   GERD (gastroesophageal reflux disease)    Glaucoma    Phreesia 05/31/2020   Gum disease    H/O degenerative disc disease    Hemorrhoids    Hyperlipidemia    Phreesia 05/31/2020   Inguinal hernia    Melanoma ([place])    facial   Renal disease    Bright's Disease-childhood   Syncope    Umbilical hernia    Urinary tract infection      Family History  Problem Relation Age of Onset   Colon cancer Mother    Heart attack Father        Deceased-Early 68s   Syncope episode Sister    Healthy Sister        x1   Dementia Maternal Grandmother     Cancer Maternal Grandfather    Healthy Daughter        x2   Healthy Son        x2   Colon polyps Neg Hx    Rectal cancer Neg Hx    Stomach cancer Neg Hx      Social History   Socioeconomic History   Marital status: Married    Spouse name: Not on file   Number of children: 4   Years of education: 12   Highest education level: Not on file  Occupational History   Occupation: Disability  Tobacco Use   Smoking status: Former    Packs/day: 2.00    Years: 40.00    Additional pack years: 0.00    Total pack years: 80.00    Types: Cigarettes    Quit date: 04/16/2014    Years since quitting: 8.2   Smokeless tobacco: Former  Scientific laboratory technician Use: Never used  Substance and Sexual Activity   Alcohol use: Yes    Alcohol/week: 14.0 standard drinks of alcohol    Types: 14 Cans of beer per week    Comment: 2 beers a  day maybe    Drug use: No   Sexual activity: Yes    Birth control/protection: None  Other Topics Concern   Not on file  Social History Narrative   Not on file   Social Determinants of Health   Financial Resource Strain: Low Risk  (06/21/2022)   Overall Financial Resource Strain (CARDIA)    Difficulty of Paying Living Expenses: Not hard at all  Food Insecurity: No Food Insecurity (06/21/2022)   Hunger Vital Sign    Worried About Running Out of Food in the Last Year: Never true    Ran Out of Food in the Last Year: Never true  Transportation Needs: No Transportation Needs (06/21/2022)   PRAPARE - Hydrologist (Medical): No    Lack of Transportation (Non-Medical): No  Physical Activity: Sufficiently Active (06/21/2022)   Exercise Vital Sign    Days of Exercise per Week: 7 days    Minutes of Exercise per Session: 60 min  Stress: No Stress Concern Present (06/21/2022)   [place]    Feeling of Stress : Not at all  Social Connections: Socially Isolated (06/21/2022)   Social  Connection and Isolation Panel [NHANES]    Frequency of Communication with Friends and Family: Once a week    Frequency of Social Gatherings with Friends and Family: Once a week    Attends Religious Services: Never    Marine scientist or Organizations: No    Attends Archivist Meetings: Never    Marital Status: Married  Human resources officer Violence: Unknown (06/21/2022)   Humiliation, Afraid, Rape, and Kick questionnaire    Fear of Current or Ex-Partner: Not on file    Emotionally Abused: No    Physically Abused: No    Sexually Abused: No    Worked around diesel, Dealer.  Has lived in [place], [place] No [employer]    No Known Allergies   Outpatient Medications Prior to Visit  Medication Sig Dispense Refill   pantoprazole (PROTONIX) 40 MG tablet Take 1 tablet (40 mg total) by mouth 2 (two) times daily. 180 tablet 4   polyethylene glycol (MIRALAX) 17 g packet 1 - 2 capfuls daily 14 each 0   rosuvastatin (CRESTOR) 20 MG tablet TAKE 1 TABLET (20 MG TOTAL) BY MOUTH DAILY. 90 tablet 1   timolol (TIMOPTIC) 0.5 % ophthalmic solution Place 1 drop into the left eye every morning.     diltiazem 2 % GEL Using your index finger, you should apply a small amount of medication inside the rectum up to your first knuckle/joint 3 to 4 times daily x 6 to 8 weeks. (Patient not taking: Reported on 06/08/2022) 30 g 0   Wheat Dextrin (BENEFIBER) POWD 1 tablespoons three times daily with meals (Patient not taking: Reported on 07/06/2022) 30 g 0   No facility-administered medications prior to visit.          Objective:   Physical Exam Vitals:   07/06/22 1300  BP: 122/72  Pulse: 73  Temp: 98.2 F (36.8 C)  TempSrc: Oral  SpO2: 99%  Weight: 224 lb 6.4 oz (101.8 kg)  Height: 5\' 10"  (1.778 m)    Gen: Pleasant, well-nourished, in no distress,  normal affect  ENT: No lesions,  mouth clear,  oropharynx clear, no postnasal drip  Neck: No JVD, no stridor  Lungs: No use of accessory muscles, no  crackles or wheezing on normal respiration, no wheeze on forced  expiration  Cardiovascular: RRR, heart sounds normal, no murmur or gallops, no peripheral edema  Musculoskeletal: No deformities, no cyanosis or clubbing  Neuro: alert, awake, non focal  Skin: Warm, no lesions or rash      Assessment & Plan:  Pulmonary nodule In a patient with a heavy tobacco history.  Least moderate suspicion for primary malignancy.  That said the nodule has some characteristics that would be more consistent with inflammation or infection including some air bronchograms, mixed density.  He was still ill when the scan was done, getting over influenza B.  Question whether he may have had some superimposed pneumonia. I will treat him with doxycycline for 7 days, plan to repeat his CT chest in 6 weeks.  [place]  I will order the scan to be done in [facility] with images returned to me.  If the nodule persist based on suspicion I think we should probably perform navigational bronchoscopy.  Discussed this with him.  COPD (chronic obstructive pulmonary disease) ([place]) Presumed COPD based on symptoms and emphysema on his imaging.  He will need pulmonary function testing at some point going forward.  He is leaving town so we cannot arrange for these now   Baltazar Apo, MD, PhD 07/06/2022, 1:29 PM [facility] [phone] or if no answer before 7:00PM call [phone] For any issues after 7:00PM please call eLink [phone]

## 2022-07-06 NOTE — Patient Instructions (Signed)
Please take doxycycline 100 mg twice a day for 7 days We will repeat a CT scan of the chest in the beginning of May.  [place]  We will need to ensure that the images are sent to Dr. Lamonte Sakai for review. Dr. Lamonte Sakai will call you to discuss the imaging.  If your pulmonary nodule persists then we will probably pursue bronchoscopy and biopsy. We will arrange for pulmonary function testing at some point in the future. Follow Dr. Lamonte Sakai in 6 months.  We will arrange for sooner follow-up if we need to arrange the procedure.

## 2022-07-06 NOTE — Assessment & Plan Note (Signed)
In a patient with a heavy tobacco history.  Least moderate suspicion for primary malignancy.  That said the nodule has some characteristics that would be more consistent with inflammation or infection including some air bronchograms, mixed density.  He was still ill when the scan was done, getting over influenza B.  Question whether he may have had some superimposed pneumonia. I will treat him with doxycycline for 7 days, plan to repeat his CT chest in 6 weeks.  [place]  I will order the scan to be done in [facility] with images returned to me.  If the nodule persist based on suspicion I think we should probably perform navigational bronchoscopy.  Discussed this with him.

## 2022-07-09 MED ORDER — DOXYCYCLINE HYCLATE 100 MG PO TABS: 100.0000 mg | ORAL_TABLET | Freq: Two times a day (BID) | ORAL | 0 refills | Status: DC

## 2022-08-21 ENCOUNTER — Encounter: Payer: Self-pay | Admitting: Emergency Medicine

## 2022-08-21 NOTE — Telephone Encounter (Signed)
Absolutely - have him mail [place] the disk, to my attention so it will go in my review folder. Thank you

## 2022-08-24 NOTE — Telephone Encounter (Signed)
Pt's spouse called in & would like to confirm that we received the disc.  Pt would like to get results before the weekend.  Can be reached at [phone].

## 2022-08-27 NOTE — Telephone Encounter (Signed)
Disk and report located and placed under Dr. Kavin Leech review folder.   Routing to Dr. Delton Coombes as Lorain Childes. Nothing further needed at this time.

## 2022-08-28 ENCOUNTER — Telehealth: Payer: Self-pay | Admitting: Emergency Medicine

## 2022-08-28 DIAGNOSIS — R911 Solitary pulmonary nodule: Secondary | ICD-10-CM

## 2022-08-28 NOTE — Telephone Encounter (Signed)
I reviewed the patient's CT scan of the chest with him by phone.  The posterior right upper lobe irregular nodular opacity is still present following antibiotics.  Suspicious for possible malignancy.  I have recommended a robotic assisted navigational bronchoscopy.  He understands the risks, benefits, rationale.  He agrees to proceed.  I will work on getting this set up for 09/17/2022.  He will need to travel home from [facility].  Understands that he will need to be monitored at home postanesthesia.

## 2022-08-28 NOTE — Telephone Encounter (Signed)
I called the patient and discussed.  Please see phone note

## 2022-08-29 ENCOUNTER — Encounter: Payer: Self-pay | Admitting: Emergency Medicine

## 2022-08-29 ENCOUNTER — Other Ambulatory Visit: Payer: Self-pay

## 2022-08-29 DIAGNOSIS — R911 Solitary pulmonary nodule: Secondary | ICD-10-CM

## 2022-09-03 ENCOUNTER — Other Ambulatory Visit: Payer: Self-pay

## 2022-09-03 DIAGNOSIS — E782 Mixed hyperlipidemia: Secondary | ICD-10-CM

## 2022-09-03 MED ORDER — ROSUVASTATIN CALCIUM 20 MG PO TABS: 20.0000 mg | ORAL_TABLET | Freq: Every day | ORAL | 1 refills | Status: DC

## 2022-09-24 ENCOUNTER — Other Ambulatory Visit: Payer: Self-pay

## 2022-09-24 ENCOUNTER — Encounter (HOSPITAL_COMMUNITY): Payer: Self-pay | Admitting: Emergency Medicine

## 2022-09-24 NOTE — Pre-Procedure Instructions (Addendum)
PCP - [name], PA Cardiologist - Little Ishikawa, MD  Chest x-ray - 06/04/22 EKG - 06/04/22 Stress Test - 05/10/21 ECHO - 05/22/21  COVID - Pt was told he was going to get a covid test DOS.  Anesthesia review: N  Patient verbally denies any shortness of breath, fever, cough and chest pain during phone call   -------------  SDW INSTRUCTIONS given:  Your procedure is scheduled on Tuesday, June 11th.  Report to [facility] Main Entrance "A" at 0745 A.M., and check in at the Admitting office.  Call this number if you have problems the morning of surgery:  [phone]   Remember:  Do not eat or drink after midnight the night before your surgery    Take these medicines the morning of surgery with A SIP OF WATER  pantoprazole (PROTONIX)  acetaminophen (TYLENOL)-if needed  As of today, STOP taking any Aspirin (unless otherwise instructed by your surgeon) Aleve, Naproxen, Ibuprofen, Motrin, Advil, Goody's, BC's, all herbal medications, fish oil, and all vitamins.                      Do not wear jewelry, make up, or nail polish            Do not wear lotions, powders, perfumes/colognes, or deodorant.            Do not shave 48 hours prior to surgery.  Men may shave face and neck.            Do not bring valuables to the hospital.            [facility] is not responsible for any belongings or valuables.  Do NOT Smoke (Tobacco/Vaping) 24 hours prior to your procedure If you use a CPAP at night, you may bring all equipment for your overnight stay.   Contacts, glasses, dentures or bridgework may not be worn into surgery.      For patients admitted to the hospital, discharge time will be determined by your treatment team.   Patients discharged the day of surgery will not be allowed to drive home, and someone needs to stay with them for 24 hours.    Special instructions:   [facility]- Preparing For Surgery  Before surgery, you can play an important role. Because  skin is not sterile, your skin needs to be as free of germs as possible. You can reduce the number of germs on your skin by washing with CHG (chlorahexidine gluconate) Soap before surgery.  CHG is an antiseptic cleaner which kills germs and bonds with the skin to continue killing germs even after washing.    Oral Hygiene is also important to reduce your risk of infection.  Remember - BRUSH YOUR TEETH THE MORNING OF SURGERY WITH YOUR REGULAR TOOTHPASTE  Please do not use if you have an allergy to CHG or antibacterial soaps. If your skin becomes reddened/irritated stop using the CHG.  Do not shave (including legs and underarms) for at least 48 hours prior to first CHG shower. It is OK to shave your face.  Please follow these instructions carefully.   Shower the NIGHT BEFORE SURGERY and the MORNING OF SURGERY with DIAL Soap.   Pat yourself dry with a CLEAN TOWEL.  Wear CLEAN PAJAMAS to bed the night before surgery  Place CLEAN SHEETS on your bed the night of your first shower and DO NOT SLEEP WITH PETS.   Day of Surgery: Please shower morning of surgery  Wear Clean/Comfortable clothing the morning of surgery Do not apply any deodorants/lotions.   Remember to brush your teeth WITH YOUR REGULAR TOOTHPASTE.   Questions were answered. Patient verbalized understanding of instructions.

## 2022-09-25 ENCOUNTER — Ambulatory Visit (HOSPITAL_COMMUNITY)
Admission: RE | Admit: 2022-09-25 | Discharge: 2022-09-25 | Disposition: A | Discharge status: Home / Self Care | Payer: Medicare PPO | Attending: Emergency Medicine | Admitting: Emergency Medicine

## 2022-09-25 ENCOUNTER — Encounter (HOSPITAL_COMMUNITY)
Admission: RE | Disposition: A | Discharge status: Home / Self Care | Payer: Self-pay | Source: Home / Self Care | Attending: Emergency Medicine

## 2022-09-25 ENCOUNTER — Encounter (HOSPITAL_COMMUNITY): Payer: Self-pay | Admitting: Emergency Medicine

## 2022-09-25 ENCOUNTER — Ambulatory Visit (HOSPITAL_COMMUNITY): Payer: Medicare PPO

## 2022-09-25 ENCOUNTER — Ambulatory Visit (HOSPITAL_BASED_OUTPATIENT_CLINIC_OR_DEPARTMENT_OTHER): Payer: Medicare PPO | Admitting: Anesthesiology

## 2022-09-25 ENCOUNTER — Ambulatory Visit (HOSPITAL_COMMUNITY): Payer: Medicare PPO | Admitting: Anesthesiology

## 2022-09-25 ENCOUNTER — Other Ambulatory Visit: Payer: Self-pay

## 2022-09-25 DIAGNOSIS — C3411 Malignant neoplasm of upper lobe, right bronchus or lung: Secondary | ICD-10-CM | POA: Insufficient documentation

## 2022-09-25 DIAGNOSIS — Z87891 Personal history of nicotine dependence: Secondary | ICD-10-CM | POA: Insufficient documentation

## 2022-09-25 DIAGNOSIS — Z09 Encounter for follow-up examination after completed treatment for conditions other than malignant neoplasm: Secondary | ICD-10-CM | POA: Insufficient documentation

## 2022-09-25 DIAGNOSIS — E669 Obesity, unspecified: Secondary | ICD-10-CM

## 2022-09-25 DIAGNOSIS — E782 Mixed hyperlipidemia: Secondary | ICD-10-CM

## 2022-09-25 DIAGNOSIS — J309 Allergic rhinitis, unspecified: Secondary | ICD-10-CM | POA: Insufficient documentation

## 2022-09-25 DIAGNOSIS — N189 Chronic kidney disease, unspecified: Secondary | ICD-10-CM | POA: Insufficient documentation

## 2022-09-25 DIAGNOSIS — J4489 Other specified chronic obstructive pulmonary disease: Secondary | ICD-10-CM | POA: Insufficient documentation

## 2022-09-25 DIAGNOSIS — K219 Gastro-esophageal reflux disease without esophagitis: Secondary | ICD-10-CM | POA: Insufficient documentation

## 2022-09-25 DIAGNOSIS — R911 Solitary pulmonary nodule: Secondary | ICD-10-CM | POA: Diagnosis present

## 2022-09-25 DIAGNOSIS — J449 Chronic obstructive pulmonary disease, unspecified: Secondary | ICD-10-CM | POA: Diagnosis not present

## 2022-09-25 DIAGNOSIS — Z6831 Body mass index (BMI) 31.0-31.9, adult: Secondary | ICD-10-CM

## 2022-09-25 HISTORY — DX: Spinal stenosis, cervical region: M48.02

## 2022-09-25 HISTORY — PX: BRONCHIAL NEEDLE ASPIRATION BIOPSY: SHX5106

## 2022-09-25 HISTORY — DX: Other complications of anesthesia, initial encounter: T88.59XA

## 2022-09-25 HISTORY — PX: BRONCHIAL BIOPSY: SHX5109

## 2022-09-25 HISTORY — PX: BRONCHIAL BRUSHINGS: SHX5108

## 2022-09-25 HISTORY — DX: Other specified postprocedural states: Z98.890

## 2022-09-25 HISTORY — DX: Nausea with vomiting, unspecified: R11.2

## 2022-09-25 HISTORY — PX: FIDUCIAL MARKER PLACEMENT: SHX6858

## 2022-09-25 LAB — CBC
HCT: 44.7 % (ref 39.0–52.0)
Hemoglobin: 15.2 g/dL (ref 13.0–17.0)
MCH: 29.9 pg (ref 26.0–34.0)
MCHC: 34 g/dL (ref 30.0–36.0)
MCV: 88 fL (ref 80.0–100.0)
Platelets: 249 10*3/uL (ref 150–400)
RBC: 5.08 MIL/uL (ref 4.22–5.81)
RDW: 12.8 % (ref 11.5–15.5)
WBC: 9.2 10*3/uL (ref 4.0–10.5)
nRBC: 0 % (ref 0.0–0.2)

## 2022-09-25 SURGERY — BRONCHOSCOPY, WITH BIOPSY USING ELECTROMAGNETIC NAVIGATION
Anesthesia: General | Laterality: Right

## 2022-09-25 MED ORDER — PROPOFOL 10 MG/ML IV BOLUS
INTRAVENOUS | Status: DC | PRN
  Administered 2022-09-25: 150 mg via INTRAVENOUS

## 2022-09-25 MED ORDER — SUCCINYLCHOLINE CHLORIDE 200 MG/10ML IV SOSY
PREFILLED_SYRINGE | INTRAVENOUS | Status: DC | PRN
  Administered 2022-09-25: 120 mg via INTRAVENOUS

## 2022-09-25 MED ORDER — OXYCODONE HCL 5 MG/5ML PO SOLN: 5.0000 mg | Freq: Once | ORAL | Status: DC | PRN

## 2022-09-25 MED ORDER — ROCURONIUM BROMIDE 10 MG/ML (PF) SYRINGE
PREFILLED_SYRINGE | INTRAVENOUS | Status: DC | PRN
  Administered 2022-09-25: 40 mg via INTRAVENOUS

## 2022-09-25 MED ORDER — LACTATED RINGERS IV SOLN: INTRAVENOUS | Status: DC

## 2022-09-25 MED ORDER — OXYCODONE HCL 5 MG PO TABS: 5.0000 mg | ORAL_TABLET | Freq: Once | ORAL | Status: DC | PRN

## 2022-09-25 MED ORDER — HYDROMORPHONE HCL 1 MG/ML IJ SOLN: 0.2500 mg | INTRAMUSCULAR | Status: DC | PRN

## 2022-09-25 MED ORDER — DEXAMETHASONE SODIUM PHOSPHATE 10 MG/ML IJ SOLN
INTRAMUSCULAR | Status: DC | PRN
  Administered 2022-09-25: 10 mg via INTRAVENOUS

## 2022-09-25 MED ORDER — MIDAZOLAM HCL 2 MG/2ML IJ SOLN
INTRAMUSCULAR | Status: DC | PRN
  Administered 2022-09-25: 2 mg via INTRAVENOUS

## 2022-09-25 MED ORDER — LIDOCAINE 2% (20 MG/ML) 5 ML SYRINGE
INTRAMUSCULAR | Status: DC | PRN
  Administered 2022-09-25: 100 mg via INTRAVENOUS

## 2022-09-25 MED ORDER — ONDANSETRON HCL 4 MG/2ML IJ SOLN
INTRAMUSCULAR | Status: DC | PRN
  Administered 2022-09-25: 4 mg via INTRAVENOUS

## 2022-09-25 MED ORDER — PROMETHAZINE HCL 25 MG/ML IJ SOLN: 6.2500 mg | INTRAMUSCULAR | Status: DC | PRN

## 2022-09-25 MED ORDER — ROSUVASTATIN CALCIUM 20 MG PO TABS
20.0000 mg | ORAL_TABLET | Freq: Every evening | ORAL | Status: DC
Start: 2022-09-25 — End: 2022-11-30

## 2022-09-25 MED ORDER — SUGAMMADEX SODIUM 200 MG/2ML IV SOLN
INTRAVENOUS | Status: DC | PRN
  Administered 2022-09-25: 200 mg via INTRAVENOUS

## 2022-09-25 MED ORDER — CHLORHEXIDINE GLUCONATE 0.12 % MT SOLN
15.0000 mL | Freq: Once | OROMUCOSAL | Status: AC
  Administered 2022-09-25: 15 mL via OROMUCOSAL
  Filled 2022-09-25: qty 15

## 2022-09-25 MED ORDER — AMISULPRIDE (ANTIEMETIC) 5 MG/2ML IV SOLN: 10.0000 mg | Freq: Once | INTRAVENOUS | Status: DC | PRN

## 2022-09-25 MED ORDER — FENTANYL CITRATE (PF) 250 MCG/5ML IJ SOLN
INTRAMUSCULAR | Status: DC | PRN
  Administered 2022-09-25: 50 ug via INTRAVENOUS

## 2022-09-25 SURGICAL SUPPLY — 1 items: superlock fiducial marker IMPLANT

## 2022-09-25 NOTE — H&P (Signed)
Brandon Schultz is an 72 y.o. male.   Chief Complaint: Pulmonary nodule HPI: 72 year old former smoker (80 pack years), history of COPD, allergic rhinitis, CKD, GERD. He has participated in the lung cancer screening program with CT scans dating back to 03/2018.  He was seen 07/06/2022 to discuss his most recent screening scan done 06/29/2022 that showed a new posterior right upper lobe microlobulated nodule with some slight spiculation, internal air bronchograms.  We repeated [facility] CT chest in [facility] in May, showed that the area persisted.  Recommendation made to achieve a tissue diagnosis and get culture data via robotic assisted navigational bronchoscopy. Patient has had some cough, occasional mucus production, no dyspnea.  Past Medical History:  Diagnosis Date   Allergy    seasonal   Arthritis    Asthma    Cataract    Phreesia 05/31/2020   Cervical stenosis of spine    Chronic kidney disease    [employer]reesia 05/31/2020   Colon polyps    Colon polyps    Complication of anesthesia    COPD (chronic obstructive pulmonary disease) (HCC)    Diverticulitis    Emphysema    Emphysema of lung (HCC)    [employer]reesia 05/31/2020   GERD (gastroesophageal reflux disease)    Glaucoma    Phreesia 05/31/2020   Gum disease    H/O degenerative disc disease    Hemorrhoids    Hyperlipidemia    Phreesia 05/31/2020   Inguinal hernia    Melanoma (HCC)    facial   PONV (postoperative nausea and vomiting)    Renal disease    Bright's Disease-childhood   Syncope    Umbilical hernia    Urinary tract infection     Past Surgical History:  Procedure Laterality Date   cataract surgery Bilateral 11/2020   COLONOSCOPY  06/08/2021   07/27/2016   DENTAL SURGERY     HERNIA REPAIR     Umbilical & Inguinal   left knee surgery     MELANOMA EXCISION     facial   neck and shoulder injection     POLYPECTOMY     WISDOM TOOTH EXTRACTION      Family History  Problem Relation Age of Onset   Colon cancer  Mother    Heart attack Father        Deceased-Early 14s   Syncope episode Sister    Healthy Sister        x1   Dementia Maternal Grandmother    Cancer Maternal Grandfather    Healthy Daughter        x2   Healthy Son        x2   Colon polyps Neg Hx    Rectal cancer Neg Hx    Stomach cancer Neg Hx    Social History:  reports that he quit smoking about 8 years ago. His smoking use included cigarettes. He has a 80.00 pack-year smoking history. He has quit using smokeless tobacco. He reports current alcohol use of about 14.0 standard drinks of alcohol per week. He reports that he does not use drugs.  Allergies: No Known Allergies  Medications Prior to Admission  Medication Sig Dispense Refill   acetaminophen (TYLENOL) 500 MG tablet Take 500-1,000 mg by mouth every 6 (six) hours as needed (pain.).     pantoprazole (PROTONIX) 40 MG tablet Take 1 tablet (40 mg total) by mouth 2 (two) times daily. 180 tablet 4   rosuvastatin (CRESTOR) 20 MG tablet Take 1 tablet (20  mg total) by mouth daily. (Patient taking differently: Take 20 mg by mouth every evening.) 90 tablet 1   timolol (TIMOPTIC) 0.5 % ophthalmic solution Place 1 drop into the left eye every morning.     doxycycline (VIBRA-TABS) 100 MG tablet Take 1 tablet (100 mg total) by mouth 2 (two) times daily. (Patient not taking: Reported on 09/19/2022) 20 tablet 0    Results for orders placed or performed during the hospital encounter of 09/25/22 (from the past 48 hour(s))  CBC per protocol     Status: None   Collection Time: 09/25/22  8:05 AM  Result Value Ref Range   WBC 9.2 4.0 - 10.5 K/uL   RBC 5.08 4.22 - 5.81 MIL/uL   Hemoglobin 15.2 13.0 - 17.0 g/dL   HCT 16.1 09.6 - 04.5 %   MCV 88.0 80.0 - 100.0 fL   MCH 29.9 26.0 - 34.0 pg   MCHC 34.0 30.0 - 36.0 g/dL   RDW 40.9 81.1 - 91.4 %   Platelets 249 150 - 400 K/uL   nRBC 0.0 0.0 - 0.2 %    Comment: Performed at [facility], [address], [place], [place] [address]   No  results found.  Review of Systems  Blood pressure 132/84, pulse 66, temperature 98.6 F (37 C), resp. rate 20, height 5\' 10"  (1.778 m), weight 99.8 kg, SpO2 96 %. Physical Exam  Gen: Pleasant, well-nourished, in no distress,  normal affect  ENT: No lesions,  mouth clear,  oropharynx clear, no postnasal drip  Neck: No JVD, no stridor  Lungs: No use of accessory muscles, distant but no crackles or wheezing on normal respiration, no wheeze on forced expiration  Cardiovascular: RRR, heart sounds normal, no murmur or gallops, no peripheral edema  Abdomen: soft and NT, no HSM,  BS normal  Musculoskeletal: No deformities, no cyanosis or clubbing  Neuro: alert, awake, non focal  Skin: Warm, no lesions or rashes    Assessment/Plan Right upper lobe pulmonary nodule in a patient with significant tobacco history, moderate suspicion for primary malignancy.  The internal air bronchograms could suggest an inflammatory infectious process.  Have recommended robotic assisted navigational bronchoscopy to sample the area, obtain cytology and microbiology.  The patient understands the risk, benefits, rationale and agrees to proceed.  No barriers identified.  Leslye Peer, MD 09/25/2022, 8:37 AM

## 2022-09-25 NOTE — Anesthesia Procedure Notes (Signed)
Procedure Name: Intubation Date/Time: 09/25/2022 11:17 AM  Performed by: Kayleen Memos, CRNAPre-anesthesia Checklist: Patient identified, Emergency Drugs available, Suction available, Patient being monitored and Timeout performed Patient Re-evaluated:Patient Re-evaluated prior to induction Oxygen Delivery Method: Circle system utilized Preoxygenation: Pre-oxygenation with 100% oxygen Induction Type: IV induction Ventilation: Mask ventilation with difficulty Laryngoscope Size: Glidescope and 3 Grade View: Grade I Tube type: Oral Tube size: 9.0 mm Number of attempts: 1 Airway Equipment and Method: Rigid stylet and Video-laryngoscopy Placement Confirmation: ETT inserted through vocal cords under direct vision, positive ETCO2, CO2 detector and breath sounds checked- equal and bilateral Secured at: 24 cm Tube secured with: Tape Dental Injury: Teeth and Oropharynx as per pre-operative assessment

## 2022-09-25 NOTE — Research (Signed)
Title: A multi-center, prospective, single-arm, observational study to evaluate real-world outcomes for the shape-sensing Ion endoluminal system  Primary Outcome: Evaluate procedure characteristics and short and long-term patient outcomes following shape-sensing robotic-assisted bronchoscopy (ssRAB) utilizing the Ion Endoluminal System for lung lesion localization or biopsy.   Protocol # / Study Name: ISI-ION-003 Clinical Trials #: [id number] Sponsor: [employer] Principal Investigator: Dr. Bradley Icard  Key Features of Ion Endoluminal System (referred to as "Ion") Ion is the first FDA cleared bronchoscopy system that uses fiber optic shape sensing technology to inform on location within the airways. Its catheter/tool channel has a smaller outer diameter (3.5 mm) in comparison to conventional bronchoscopes, allowing it to navigate into the smaller airways of the periphery.     Key Inclusion Criteria Subject is 18 years or older at the time of the procedure Subject is a candidate for a planned, elective RAB lung lesion localization or biopsy procedure in which the Ion Endoluminal System is planned to be utilized.  Subject  able to understand and adhere to study requirements and provide informed consent.   Key Exclusion Criteria Subject is under the care of a Legally Authorized Representative and is unable to provide informed consent on their own accord.  Subject is participating in an interventional research study or research study with investigational agents with an unknown safety profile that would interfere with participation or the results of this study.  Male subjects who are pregnant or nursing at the time of the index Ion procedure, as determined by standard site practices. Subjects that are incarcerated or institutionalized under court order, or other vulnerable populations.    Previous Clinical Trials Since receiving FDA clearance in Feb 2019, Ion has been adopted  commercially by over 226 centers in the [place], and utilized in over 40,000 procedures.  The first in-human study enrolled 29 subjects with a mean lesion size of 14.8 mm and the overall diagnostic yield was 79.3%, with no adverse events. 17 (58.6%) lesions were reported to have a bronchus sign available on CT imaging.  A multi-center study published results in 2022, with 270 lesions biopsied in 241 patients using Ion. The mean largest cardinal lesion size was 18.86.5mm, and the mean airway generation count was 7.01.6. Asymptomatic pneumothorax occurred in 3.3% of subjects, and 0.8% experienced airway bleeding.   Another study provided preliminary results in 2022, with 87% sensitivity for malignancy, a diagnostic yield of 81%, and a mean lesion size of 16 mm. 75% of biopsy cases were bronchus-sign negative. 4% of subjects experienced pneumothoraces (including those requiring intervention), and 0.8% of subjects experienced airway bleeding requiring wedging or balloon tamponade.  A single-center study captured 131 consecutive procedures of pulmonary biopsy using Ion. The navigational success rate was 98.7%, with an overall diagnostic yield of 81.7%, an overall complication rate of 3%, and a pneumothorax rate of 1.5%.    [facility] Clinical Research Coordinator note:   This visit for Subject Brandon Schultz with DOB: 09/06/1958 on 09/25/2022 for the above protocol is Visit/Encounter # Pre-procedure, Intra-procedure and Post-procedure  and is for purpose of research.   The consent for this encounter is under:  Protocol Version 1.0 Investigator Brochure Version N/A Consent Version Revision A, dated 14Nov2023 and is currently IRB approved.   Brandon Schultz expressed continued interest and consent in continuing as a study subject. Subject confirmed that there was no change in contact information (e.g. address, telephone, email). Subject thanked for participation in research and  contribution to science.   In this visit 09/25/2022 the subject will be evaluated by Sub-Investigator named Dr. Byrum. This research coordinator has verified that the above investigator is up to date with his/her training logs.   The Subject was informed that the PI continues to have oversight of the subject's visits and course through relevant discussions, reviews, and also specifically of this visit by routing of this note to the PI.  The research study was discussed with the subject in the pre-operative room. The study was explained in detail including all the contents of the informed consent document. The subject was encouraged to ask questions. All questions were answered to their satisfaction. The IRB approved informed consent was signed, and a copy was given to the subject. After obtaining consent, the subject underwent scheduled procedure using the ion endoluminal system. Data collection was completed per protocol. Refer to paper source subject binder for further details.      Signed by  Damari Hiltz Clinical Research Coordinator / Sub-Investigator  [facility]  [place], [place] 12:06 PM 09/25/2022  

## 2022-09-25 NOTE — Discharge Instructions (Signed)
Flexible Bronchoscopy, Care After This sheet gives you information about how to care for yourself after your test. Your doctor may also give you more specific instructions. If you have problems or questions, contact your doctor. Follow these instructions at home: Eating and drinking When your numbness is gone and your cough and gag reflexes have come back, you may: Eat only soft foods. Slowly drink liquids. The day after the test, go back to your normal diet. Driving Do not drive for 24 hours if you were given a medicine to help you relax (sedative). Do not drive or use heavy machinery while taking prescription pain medicine. General instructions  Take over-the-counter and prescription medicines only as told by your doctor. Return to your normal activities as told. Ask what activities are safe for you. Do not use any products that have nicotine or tobacco in them. This includes cigarettes and e-cigarettes. If you need help quitting, ask your doctor. Keep all follow-up visits as told by your doctor. This is important. It is very important if you had a tissue sample (biopsy) taken. Get help right away if: You have shortness of breath that gets worse. You get light-headed. You feel like you are going to pass out (faint). You have chest pain. You cough up: More than a little blood. More blood than before. Summary Do not eat or drink anything (not even water) for 2 hours after your test, or until your numbing medicine wears off. Do not use cigarettes. Do not use e-cigarettes. Get help right away if you have chest pain.  Please call our office for any questions or concerns.  [phone].  This information is not intended to replace advice given to you by your health care provider. Make sure you discuss any questions you have with your health care provider. Document Released: 01/28/2009 Document Revised: 03/15/2017 Document Reviewed: 04/20/2016 Elsevier Patient Education  2020 Elsevier  Inc.  

## 2022-09-25 NOTE — Transfer of Care (Signed)
Immediate Anesthesia Transfer of Care Note  Patient: Brandon Schultz  Procedure(s) Performed: ROBOTIC ASSISTED NAVIGATIONAL BRONCHOSCOPY (Right) BRONCHIAL BRUSHINGS BRONCHIAL BIOPSIES BRONCHIAL NEEDLE ASPIRATION BIOPSIES FIDUCIAL MARKER PLACEMENT  Patient Location: PACU  Anesthesia Type:General  Level of Consciousness: oriented and drowsy  Airway & Oxygen Therapy: Patient Spontanous Breathing and Patient connected to nasal cannula oxygen  Post-op Assessment: Report given to RN, Post -op Vital signs reviewed and stable, and Patient moving all extremities  Post vital signs: stable  Last Vitals:  Vitals Value Taken Time  BP 117/65 09/25/22 1211  Temp 37.1 C 09/25/22 1211  Pulse 64 09/25/22 1214  Resp 17 09/25/22 1214  SpO2 98 % 09/25/22 1214  Vitals shown include unvalidated device data.  Last Pain:  Vitals:   09/25/22 0754  PainSc: 0-No pain      Patients Stated Pain Goal: 0 (09/25/22 0754)  Complications: No notable events documented.

## 2022-09-25 NOTE — Anesthesia Preprocedure Evaluation (Signed)
Anesthesia Evaluation  Patient identified by MRN, date of birth, ID band Patient awake    Reviewed: Allergy & Precautions, H&P , NPO status , Patient's Chart, lab work & pertinent test results  History of Anesthesia Complications (+) PONV and history of anesthetic complications  Airway Mallampati: II  TM Distance: >3 FB Neck ROM: Full    Dental no notable dental hx.    Pulmonary COPD, former smoker   Pulmonary exam normal breath sounds clear to auscultation       Cardiovascular negative cardio ROS Normal cardiovascular exam Rhythm:Regular Rate:Normal     Neuro/Psych    Depression    negative neurological ROS  negative psych ROS   GI/Hepatic Neg liver ROS,GERD  ,,  Endo/Other  negative endocrine ROS    Renal/GU Renal InsufficiencyRenal disease  negative genitourinary   Musculoskeletal  (+) Arthritis , Osteoarthritis,    Abdominal  (+) + obese  Peds negative pediatric ROS (+)  Hematology negative hematology ROS (+)   Anesthesia Other Findings   Reproductive/Obstetrics negative OB ROS                             Anesthesia Physical Anesthesia Plan  ASA: 3  Anesthesia Plan: General   Post-op Pain Management: Minimal or no pain anticipated   Induction: Intravenous  PONV Risk Score and Plan: 3 and Ondansetron, Dexamethasone, Midazolam and Treatment may vary due to age or medical condition  Airway Management Planned: Oral ETT  Additional Equipment:   Intra-op Plan:   Post-operative Plan: Extubation in OR  Informed Consent: I have reviewed the patients History and Physical, chart, labs and discussed the procedure including the risks, benefits and alternatives for the proposed anesthesia with the patient or authorized representative who has indicated his/her understanding and acceptance.     Dental advisory given  Plan Discussed with: CRNA  Anesthesia Plan Comments:         Anesthesia Quick Evaluation

## 2022-09-25 NOTE — Anesthesia Postprocedure Evaluation (Signed)
Anesthesia Post Note  Patient: Brandon Schultz  Procedure(s) Performed: ROBOTIC ASSISTED NAVIGATIONAL BRONCHOSCOPY (Right) BRONCHIAL BRUSHINGS BRONCHIAL BIOPSIES BRONCHIAL NEEDLE ASPIRATION BIOPSIES FIDUCIAL MARKER PLACEMENT     Patient location during evaluation: PACU Anesthesia Type: General Level of consciousness: awake and alert Pain management: pain level controlled Vital Signs Assessment: post-procedure vital signs reviewed and stable Respiratory status: spontaneous breathing, nonlabored ventilation and respiratory function stable Cardiovascular status: blood pressure returned to baseline and stable Postop Assessment: no apparent nausea or vomiting Anesthetic complications: no   No notable events documented.  Last Vitals:  Vitals:   09/25/22 1230 09/25/22 1245  BP: 102/79 124/88  Pulse: 68 64  Resp: 20 18  Temp:  37.1 C  SpO2: 93% 94%    Last Pain:  Vitals:   09/25/22 1245  PainSc: 0-No pain                 Lowella Curb

## 2022-09-25 NOTE — Progress Notes (Signed)
Confirmed with Dr. Delton Coombes that a COVID test is not required for today's surgery.

## 2022-09-25 NOTE — Op Note (Signed)
Video Bronchoscopy with Robotic Assisted Bronchoscopic Navigation   Date of Operation: 09/25/2022   Pre-op Diagnosis: Right Upper Lobe Nodule  Post-op Diagnosis: same  Surgeon: Levy Pupa  Assistants: None  Anesthesia: General endotracheal anesthesia  Operation: Flexible video fiberoptic bronchoscopy with robotic assistance and biopsies.  Estimated Blood Loss: Minimal  Complications: None  Indications and History: Brandon Schultz is a 72 y.o. male with history of tobacco use found to have a right upper lobe pulmonary nodule on chest imaging.  Recommendation made to achieve a tissue diagnosis via robotic assisted navigational bronchoscopy. The risks, benefits, complications, treatment options and expected outcomes were discussed with the patient.  The possibilities of pneumothorax, pneumonia, reaction to medication, pulmonary aspiration, perforation of a viscus, bleeding, failure to diagnose a condition and creating a complication requiring transfusion or operation were discussed with the patient who freely signed the consent.    Description of Procedure: The patient was seen in the Preoperative Area, was examined and was deemed appropriate to proceed.  The patient was taken to [name] endoscopy room 3, identified as [name] and the procedure verified as Flexible Video Fiberoptic Bronchoscopy.  A Time Out was held and the above information confirmed.   Prior to the date of the procedure a high-resolution CT scan of the chest was performed. Utilizing ION software program a virtual tracheobronchial tree was generated to allow the creation of distinct navigation pathways to the patient's parenchymal abnormalities. After being taken to the operating room general anesthesia was initiated and the patient  was orally intubated. The video fiberoptic bronchoscope was introduced via the endotracheal tube and a general inspection was performed which showed normal right and left lung anatomy.  Aspiration of the bilateral mainstems was completed to remove any remaining secretions. Robotic catheter inserted into patient's endotracheal tube.   Target #1 right upper lobe pulmonary nodule: The distinct navigation pathways prepared prior to this procedure were then utilized to navigate to patient's lesion identified on CT scan. The robotic catheter was secured into place and the vision probe was withdrawn.  Lesion location was approximated using fluoroscopy.  Local registration and targeting was performed using Cios three-dimensional imaging. Under fluoroscopic guidance transbronchial needle brushings, transbronchial needle biopsies, and transbronchial forceps biopsies were performed to be sent for cytology and pathology.  Under fluoroscopic guidance a single fiducial marker was placed adjacent to the nodule   At the end of the procedure a general airway inspection was performed and there was no evidence of active bleeding. The bronchoscope was removed.  The patient tolerated the procedure well. There was no significant blood loss and there were no obvious complications. A post-procedural chest x-ray is pending.  Samples Target #1: 1. Transbronchial needle brushings from right upper lobe nodule 2. Transbronchial Wang needle biopsies from right upper lobe nodule 3. Transbronchial forceps biopsies from right upper lobe nodule   Plans:  The patient will be discharged from the PACU to home when recovered from anesthesia and after chest x-ray is reviewed. We will review the cytology, pathology and microbiology results with the patient when they become available. Outpatient followup will be with Dr. Delton Coombes.    Levy Pupa, MD, PhD 09/25/2022, 12:26 PM [facility] [phone] or if no answer before 7:00PM call [phone] For any issues after 7:00PM please call eLink [phone]

## 2022-09-29 ENCOUNTER — Telehealth: Payer: Self-pay | Admitting: Emergency Medicine

## 2022-09-29 DIAGNOSIS — C3490 Malignant neoplasm of unspecified part of unspecified bronchus or lung: Secondary | ICD-10-CM

## 2022-09-29 NOTE — Telephone Encounter (Signed)
Called the patient to inform him that his biopsy results from bronchoscopy are not yet available.  I will keep watching for these and call him when they are finalized.

## 2022-09-30 ENCOUNTER — Encounter (HOSPITAL_COMMUNITY): Payer: Self-pay | Admitting: Emergency Medicine

## 2022-10-02 LAB — CYTOLOGY - NON PAP

## 2022-10-02 NOTE — Telephone Encounter (Signed)
Discussed the pathology results with the patient.  This appears to be an isolated poorly differentiated carcinoma and the right upper lobe, question large cell versus other.  Explained to him today that we need to complete the staging, then arrange for discussion regarding treatment options.  He may even be a candidate for primary resection.  He will need pulmonary function testing, PET scan, MRI brain.  He wants to get the imaging done at [facility] which I think is fine as long as there are no delays and he can bring the images with him here to oncology.  I have recommended that he see Dr. Arbutus Ped in [place] to help coordinate the next steps.

## 2022-10-05 ENCOUNTER — Other Ambulatory Visit: Payer: Self-pay | Admitting: *Deleted

## 2022-10-05 DIAGNOSIS — R911 Solitary pulmonary nodule: Secondary | ICD-10-CM

## 2022-10-07 NOTE — Progress Notes (Unsigned)
[facility] [phone]   [phone]  CONSULT NOTE  REFERRING PHYSICIAN:  REASON FOR CONSULTATION:  ***  HPI ARTURO SOFRANKO is a 72 y.o. male with a past medical history significant for COPD, GERD, sensorineural hearing loss, osteoarthritis, and hyperlipidemia is referred to the clinic for newly diagnosed poorly differentiated lung cancer.  The patient was enrolled in the lung cancer screening program since 2019.  He had a CT scan on 06/29/22 that showed a new posterior right upper lobe microlobulated nodule with some slight spiculation and internal bronchograms.  He had a repeat CT scan of the chest in [place] in May 2024 which showed persistent area.  He then established with Dr. Delton Coombes who arranged for bronchoscopy and biopsy on 09/25/2022.  The final pathology showed ([id number]) showed poorly differentiated carcinoma the final pathology favors non-small cell lung cancer with sarcomatoid features.  The patient is scheduled for PFTs on ***, he is also scheduled for PET scan on 10/11/2022 and schedule a brain MRI 10/08/2022 to complete the staging workup.  Overall, the patient is feeling ***.  He denies any fever, chills, night sweats, or unexplained weight loss.  Chest pain and hemoptysis?  Cough and shortness of breath?  Denies any nausea, vomiting, diarrhea, or constipation.  Denies any headache or visual changes.      HPI  Past Medical History:  Diagnosis Date   Allergy    seasonal   Arthritis    Asthma    Cataract    Phreesia 05/31/2020   Cervical stenosis of spine    Chronic kidney disease    Phreesia 05/31/2020   Colon polyps    Colon polyps    Complication of anesthesia    COPD (chronic obstructive pulmonary disease) (HCC)    Diverticulitis    Emphysema    Emphysema of lung (HCC)    Phreesia 05/31/2020   GERD (gastroesophageal reflux disease)    Glaucoma    Phreesia 05/31/2020   Gum disease    H/O degenerative  disc disease    Hemorrhoids    Hyperlipidemia    Phreesia 05/31/2020   Inguinal hernia    Melanoma (HCC)    facial   PONV (postoperative nausea and vomiting)    Renal disease    Bright's Disease-childhood   Syncope    Umbilical hernia    Urinary tract infection     Past Surgical History:  Procedure Laterality Date   BRONCHIAL BIOPSY  09/25/2022   Procedure: BRONCHIAL BIOPSIES;  Surgeon: Leslye Peer, MD;  Location: [facility] ENDOSCOPY;  Service: Pulmonary;;   BRONCHIAL BRUSHINGS  09/25/2022   Procedure: BRONCHIAL BRUSHINGS;  Surgeon: Leslye Peer, MD;  Location: [facility] ENDOSCOPY;  Service: Pulmonary;;   BRONCHIAL NEEDLE ASPIRATION BIOPSY  09/25/2022   Procedure: BRONCHIAL NEEDLE ASPIRATION BIOPSIES;  Surgeon: Leslye Peer, MD;  Location: [facility] ENDOSCOPY;  Service: Pulmonary;;   cataract surgery Bilateral 11/2020   COLONOSCOPY  06/08/2021   07/27/2016   DENTAL SURGERY     FIDUCIAL MARKER PLACEMENT  09/25/2022   Procedure: FIDUCIAL MARKER PLACEMENT;  Surgeon: Leslye Peer, MD;  Location: [facility] ENDOSCOPY;  Service: Pulmonary;;   HERNIA REPAIR     Umbilical & Inguinal   left knee surgery     MELANOMA EXCISION     facial   neck and shoulder injection     POLYPECTOMY     WISDOM TOOTH EXTRACTION      Family History  Problem Relation Age of  Onset   Colon cancer Mother    Heart attack Father        Deceased-Early 37s   Syncope episode Sister    Healthy Sister        x1   Dementia Maternal Grandmother    Cancer Maternal Grandfather    Healthy Daughter        x2   Healthy Son        x2   Colon polyps Neg Hx    Rectal cancer Neg Hx    Stomach cancer Neg Hx     Social History Social History   Tobacco Use   Smoking status: Former    Packs/day: 2.00    Years: 40.00    Additional pack years: 0.00    Total pack years: 80.00    Types: Cigarettes    Quit date: 04/16/2014    Years since quitting: 8.4   Smokeless tobacco: Former  Building services engineer Use: Never used  Substance  Use Topics   Alcohol use: Yes    Alcohol/week: 14.0 standard drinks of alcohol    Types: 14 Cans of beer per week    Comment: 2 beers a day maybe    Drug use: No    No Known Allergies  Current Outpatient Medications  Medication Sig Dispense Refill   acetaminophen (TYLENOL) 500 MG tablet Take 500-1,000 mg by mouth every 6 (six) hours as needed (pain.).     pantoprazole (PROTONIX) 40 MG tablet Take 1 tablet (40 mg total) by mouth 2 (two) times daily. 180 tablet 4   rosuvastatin (CRESTOR) 20 MG tablet Take 1 tablet (20 mg total) by mouth every evening.     timolol (TIMOPTIC) 0.5 % ophthalmic solution Place 1 drop into the left eye every morning.     No current facility-administered medications for this visit.    REVIEW OF SYSTEMS:   Review of Systems  Constitutional: Negative for appetite change, chills, fatigue, fever and unexpected weight change.  HENT:   Negative for mouth sores, nosebleeds, sore throat and trouble swallowing.   Eyes: Negative for eye problems and icterus.  Respiratory: Negative for cough, hemoptysis, shortness of breath and wheezing.   Cardiovascular: Negative for chest pain and leg swelling.  Gastrointestinal: Negative for abdominal pain, constipation, diarrhea, nausea and vomiting.  Genitourinary: Negative for bladder incontinence, difficulty urinating, dysuria, frequency and hematuria.   Musculoskeletal: Negative for back pain, gait problem, neck pain and neck stiffness.  Skin: Negative for itching and rash.  Neurological: Negative for dizziness, extremity weakness, gait problem, headaches, light-headedness and seizures.  Hematological: Negative for adenopathy. Does not bruise/bleed easily.  Psychiatric/Behavioral: Negative for confusion, depression and sleep disturbance. The patient is not nervous/anxious.     PHYSICAL EXAMINATION:  There were no vitals taken for this visit.  ECOG PERFORMANCE STATUS: {CHL ONC ECOG [id number]  Physical Exam   Constitutional: Oriented to person, place, and time and well-developed, well-nourished, and in no distress. No distress.  HENT:  Head: Normocephalic and atraumatic.  Mouth/Throat: Oropharynx is clear and moist. No oropharyngeal exudate.  Eyes: Conjunctivae are normal. Right eye exhibits no discharge. Left eye exhibits no discharge. No scleral icterus.  Neck: Normal range of motion. Neck supple.  Cardiovascular: Normal rate, regular rhythm, normal heart sounds and intact distal pulses.   Pulmonary/Chest: Effort normal and breath sounds normal. No respiratory distress. No wheezes. No rales.  Abdominal: Soft. Bowel sounds are normal. Exhibits no distension and no mass. There is no tenderness.  Musculoskeletal:  Normal range of motion. Exhibits no edema.  Lymphadenopathy:    No cervical adenopathy.  Neurological: Alert and oriented to person, place, and time. Exhibits normal muscle tone. Gait normal. Coordination normal.  Skin: Skin is warm and dry. No rash noted. Not diaphoretic. No erythema. No pallor.  Psychiatric: Mood, memory and judgment normal.  Vitals reviewed.  LABORATORY DATA: Lab Results  Component Value Date   WBC 9.2 09/25/2022   HGB 15.2 09/25/2022   HCT 44.7 09/25/2022   MCV 88.0 09/25/2022   PLT 249 09/25/2022      Chemistry      Component Value Date/Time   NA 140 06/14/2022 0904   K 4.6 06/14/2022 0904   CL 103 06/14/2022 0904   CO2 24 06/14/2022 0904   BUN 21 06/14/2022 0904   CREATININE 0.92 06/14/2022 0904   CREATININE 0.92 02/18/2013 1431      Component Value Date/Time   CALCIUM 9.7 06/14/2022 0904   ALKPHOS 55 06/14/2022 0904   AST 18 06/14/2022 0904   ALT 36 06/14/2022 0904   BILITOT 0.5 06/14/2022 0904       RADIOGRAPHIC STUDIES: DG Chest Port 1 View  Result Date: 09/25/2022 CLINICAL DATA:  [id number] S/P bronchoscopy [id number] EXAM: PORTABLE CHEST 1 VIEW COMPARISON:  CXR 06/04/22 FINDINGS: No pleural effusion. No pneumothorax. No focal airspace  opacity. Unchanged cardiac and mediastinal contours. No radiographically apparent displaced rib fractures. Visualized upper abdomen is unremarkable. IMPRESSION: No focal airspace opacity. Electronically Signed   By: Lorenza Cambridge M.D.   On: 09/25/2022 13:49   DG C-ARM BRONCHOSCOPY  Result Date: 09/25/2022 C-ARM BRONCHOSCOPY: Fluoroscopy was utilized by the requesting physician.  No radiographic interpretation.    ASSESSMENT: This is a very pleasant 72 year old Caucasian male diagnosed with stage *** (T***, Nx, Mx), poorly differentiated carcinoma, favored to be non-small cell lung cancer with sarcomatoid features.  He was diagnosed in June 2024.  The patient was seen with Dr. Arbutus Ped today.  Dr. Arbutus Ped discussed the staging workup including a PET scan and PFTs to see if the patient can be evaluated by surgery and if he is a candidate for surgical resection.  His brain MRI showed ***  We will see the patient back for follow-up ***  I have placed a referral to cardiothoracic surgery   The patient voices understanding of current disease status and treatment options and is in agreement with the current care plan.  All questions were answered. The patient knows to call the clinic with any problems, questions or concerns. We can certainly see the patient much sooner if necessary.  Thank you so much for allowing me to participate in the care of Malick B Guhl. I will continue to follow up the patient with you and assist in his care.  I spent {CHL ONC TIME VISIT - XBJYN:[id number]} counseling the patient face to face. The total time spent in the appointment was {CHL ONC TIME VISIT - MVHQI:[id number]}.  Disclaimer: This note was dictated with voice recognition software. Similar sounding words can inadvertently be transcribed and may not be corrected upon review.   Ayrabella Labombard L Reiana Poteet October 07, 2022, 9:18 AM

## 2022-10-08 ENCOUNTER — Inpatient Hospital Stay: Payer: Medicare PPO

## 2022-10-08 ENCOUNTER — Inpatient Hospital Stay: Payer: Medicare PPO | Attending: Physician Assistant | Admitting: Physician Assistant

## 2022-10-08 ENCOUNTER — Ambulatory Visit
Admission: RE | Admit: 2022-10-08 | Discharge: 2022-10-08 | Disposition: A | Discharge status: Home / Self Care | Payer: Medicare PPO | Source: Ambulatory Visit | Attending: Emergency Medicine | Admitting: Emergency Medicine

## 2022-10-08 VITALS — BP 131/86 | HR 68 | Temp 97.3°F | Resp 20 | Wt 216.1 lb

## 2022-10-08 DIAGNOSIS — Z79899 Other long term (current) drug therapy: Secondary | ICD-10-CM | POA: Insufficient documentation

## 2022-10-08 DIAGNOSIS — E785 Hyperlipidemia, unspecified: Secondary | ICD-10-CM | POA: Diagnosis not present

## 2022-10-08 DIAGNOSIS — J4489 Other specified chronic obstructive pulmonary disease: Secondary | ICD-10-CM | POA: Diagnosis not present

## 2022-10-08 DIAGNOSIS — C3491 Malignant neoplasm of unspecified part of right bronchus or lung: Secondary | ICD-10-CM | POA: Diagnosis present

## 2022-10-08 DIAGNOSIS — K219 Gastro-esophageal reflux disease without esophagitis: Secondary | ICD-10-CM | POA: Diagnosis not present

## 2022-10-08 DIAGNOSIS — R911 Solitary pulmonary nodule: Secondary | ICD-10-CM

## 2022-10-08 DIAGNOSIS — Z87891 Personal history of nicotine dependence: Secondary | ICD-10-CM | POA: Diagnosis not present

## 2022-10-08 DIAGNOSIS — C3490 Malignant neoplasm of unspecified part of unspecified bronchus or lung: Secondary | ICD-10-CM | POA: Diagnosis present

## 2022-10-08 DIAGNOSIS — M1990 Unspecified osteoarthritis, unspecified site: Secondary | ICD-10-CM | POA: Diagnosis not present

## 2022-10-08 DIAGNOSIS — D6800 Von Willebrand disease, unspecified: Secondary | ICD-10-CM | POA: Insufficient documentation

## 2022-10-08 LAB — CMP (CANCER CENTER ONLY)
ALT: 14 U/L (ref 0–44)
AST: 14 U/L — ABNORMAL LOW (ref 15–41)
Albumin: 4.2 g/dL (ref 3.5–5.0)
Alkaline Phosphatase: 50 U/L (ref 38–126)
Anion gap: 5 (ref 5–15)
BUN: 18 mg/dL (ref 8–23)
CO2: 26 mmol/L (ref 22–32)
Calcium: 9.6 mg/dL (ref 8.9–10.3)
Chloride: 108 mmol/L (ref 98–111)
Creatinine: 1.08 mg/dL (ref 0.61–1.24)
GFR, Estimated: >60 mL/min (ref >60)
Glucose, Bld: 102 mg/dL — ABNORMAL HIGH (ref 70–99)
Potassium: 4 mmol/L (ref 3.5–5.1)
Sodium: 139 mmol/L (ref 135–145)
Total Bilirubin: 0.6 mg/dL (ref 0.3–1.2)
Total Protein: 6.7 g/dL (ref 6.5–8.1)

## 2022-10-08 LAB — CBC WITH DIFFERENTIAL (CANCER CENTER ONLY)
Abs Immature Granulocytes: 0.02 10*3/uL (ref 0.00–0.07)
Basophils Absolute: 0.1 10*3/uL (ref 0.0–0.1)
Basophils Relative: 1 %
Eosinophils Absolute: 0.4 10*3/uL (ref 0.0–0.5)
Eosinophils Relative: 4 %
HCT: 43.2 % (ref 39.0–52.0)
Hemoglobin: 15.1 g/dL (ref 13.0–17.0)
Immature Granulocytes: 0 %
Lymphocytes Relative: 33 %
Lymphs Abs: 3.2 10*3/uL (ref 0.7–4.0)
MCH: 30.9 pg (ref 26.0–34.0)
MCHC: 35 g/dL (ref 30.0–36.0)
MCV: 88.5 fL (ref 80.0–100.0)
Monocytes Absolute: 0.8 10*3/uL (ref 0.1–1.0)
Monocytes Relative: 8 %
Neutro Abs: 5.1 10*3/uL (ref 1.7–7.7)
Neutrophils Relative %: 54 %
Platelet Count: 268 10*3/uL (ref 150–400)
RBC: 4.88 MIL/uL (ref 4.22–5.81)
RDW: 12.5 % (ref 11.5–15.5)
WBC Count: 9.5 10*3/uL (ref 4.0–10.5)
nRBC: 0 % (ref 0.0–0.2)

## 2022-10-08 MED ORDER — GADOBUTROL 1 MMOL/ML IV SOLN
9.0000 mL | Freq: Once | INTRAVENOUS | Status: AC | PRN
  Administered 2022-10-08: 9 mL via INTRAVENOUS

## 2022-10-08 NOTE — Progress Notes (Signed)
NN met with pt face to face today at his initial med onc appt with C.Heilingoetter, Onc PA and Dr.Mohamed. Pt is accompanied by his wife, Inetta Fermo and Dondra Spry, his sister. Plan for the pt is to have PFTs performed which have been ordered. Pt has an MRI later today and a PET scan scheduled for 6/27.  Lung Cancer Journey book provided to pt with direct contact information. Pt and family denied questions or concerns at this time.  NN reached out to RT to see if there was availability for him to have his PFTs done before 6/28. NN spoke with Cordelia Pen. Cordelia Pen was able to schedule pt for 2pm this Thursday 6/27. NN called the pt, placed on speaker phone. NN informed pt that his PFTs are scheduled for 2pm this Thursday 6/27.

## 2022-10-11 ENCOUNTER — Ambulatory Visit (HOSPITAL_COMMUNITY)
Admission: RE | Admit: 2022-10-11 | Discharge: 2022-10-11 | Disposition: A | Discharge status: Home / Self Care | Payer: Medicare PPO | Source: Ambulatory Visit | Attending: Physician Assistant | Admitting: Physician Assistant

## 2022-10-11 ENCOUNTER — Ambulatory Visit
Admission: RE | Admit: 2022-10-11 | Discharge: 2022-10-11 | Disposition: A | Discharge status: Home / Self Care | Payer: Medicare PPO | Source: Ambulatory Visit | Attending: Emergency Medicine | Admitting: Emergency Medicine

## 2022-10-11 ENCOUNTER — Other Ambulatory Visit: Payer: Self-pay

## 2022-10-11 DIAGNOSIS — R0609 Other forms of dyspnea: Secondary | ICD-10-CM | POA: Insufficient documentation

## 2022-10-11 DIAGNOSIS — R059 Cough, unspecified: Secondary | ICD-10-CM | POA: Insufficient documentation

## 2022-10-11 DIAGNOSIS — R062 Wheezing: Secondary | ICD-10-CM | POA: Diagnosis not present

## 2022-10-11 DIAGNOSIS — D3502 Benign neoplasm of left adrenal gland: Secondary | ICD-10-CM | POA: Diagnosis not present

## 2022-10-11 DIAGNOSIS — C3491 Malignant neoplasm of unspecified part of right bronchus or lung: Secondary | ICD-10-CM | POA: Diagnosis not present

## 2022-10-11 DIAGNOSIS — R942 Abnormal results of pulmonary function studies: Secondary | ICD-10-CM | POA: Insufficient documentation

## 2022-10-11 DIAGNOSIS — C3490 Malignant neoplasm of unspecified part of unspecified bronchus or lung: Secondary | ICD-10-CM | POA: Insufficient documentation

## 2022-10-11 DIAGNOSIS — J439 Emphysema, unspecified: Secondary | ICD-10-CM | POA: Diagnosis not present

## 2022-10-11 DIAGNOSIS — I700 Atherosclerosis of aorta: Secondary | ICD-10-CM | POA: Diagnosis not present

## 2022-10-11 DIAGNOSIS — I2510 Atherosclerotic heart disease of native coronary artery without angina pectoris: Secondary | ICD-10-CM | POA: Diagnosis not present

## 2022-10-11 DIAGNOSIS — J984 Other disorders of lung: Secondary | ICD-10-CM | POA: Diagnosis not present

## 2022-10-11 DIAGNOSIS — Z87891 Personal history of nicotine dependence: Secondary | ICD-10-CM | POA: Insufficient documentation

## 2022-10-11 LAB — PULMONARY FUNCTION TEST
DL/VA % pred: 66 %
DL/VA: 2.68 ml/min/mmHg/L
DLCO cor % pred: 79 %
DLCO cor: 20.64 ml/min/mmHg
DLCO unc % pred: 80 %
DLCO unc: 20.93 ml/min/mmHg
FEF 25-75 Post: 3.58 L/sec
FEF 25-75 Pre: 2.63 L/sec
FEF2575-%Change-Post: 36 %
FEF2575-%Pred-Post: 144 %
FEF2575-%Pred-Pre: 106 %
FEV1-%Change-Post: 7 %
FEV1-%Pred-Post: 123 %
FEV1-%Pred-Pre: 114 %
FEV1-Post: 4.02 L
FEV1-Pre: 3.74 L
FEV1FVC-%Change-Post: 0 %
FEV1FVC-%Pred-Pre: 98 %
FEV6-%Change-Post: 6 %
FEV6-%Pred-Post: 128 %
FEV6-%Pred-Pre: 119 %
FEV6-Post: 5.36 L
FEV6-Pre: 5.01 L
FEV6FVC-%Change-Post: 0 %
FEV6FVC-%Pred-Post: 102 %
FEV6FVC-%Pred-Pre: 102 %
FVC-%Change-Post: 6 %
FVC-%Pred-Post: 124 %
FVC-%Pred-Pre: 117 %
FVC-Post: 5.52 L
FVC-Pre: 5.18 L
Post FEV1/FVC ratio: 73 %
Post FEV6/FVC ratio: 97 %
Pre FEV1/FVC ratio: 72 %
Pre FEV6/FVC Ratio: 97 %
RV % pred: 141 %
RV: 3.47 L
TLC % pred: 124 %
TLC: 8.73 L

## 2022-10-11 LAB — GLUCOSE, CAPILLARY: Glucose-Capillary: 111 mg/dL — ABNORMAL HIGH (ref 70–99)

## 2022-10-11 MED ORDER — FLUDEOXYGLUCOSE F - 18 (FDG) INJECTION
11.9600 | Freq: Once | INTRAVENOUS | Status: AC | PRN
  Administered 2022-10-11: 11.96 via INTRAVENOUS

## 2022-10-11 MED ORDER — ALBUTEROL SULFATE (2.5 MG/3ML) 0.083% IN NEBU
2.5000 mg | INHALATION_SOLUTION | Freq: Once | RESPIRATORY_TRACT | Status: AC
  Administered 2022-10-11: 2.5 mg via RESPIRATORY_TRACT

## 2022-10-11 NOTE — Progress Notes (Signed)
The proposed treatment discussed in conference is for discussion purpose only and is not a binding recommendation.  The patients have not been physically examined, or presented with their treatment options.  Therefore, final treatment plans cannot be decided.  

## 2022-10-23 ENCOUNTER — Inpatient Hospital Stay: Payer: Medicare PPO | Attending: Physician Assistant | Admitting: Internal Medicine

## 2022-10-23 ENCOUNTER — Encounter: Payer: Self-pay | Admitting: Internal Medicine

## 2022-10-23 DIAGNOSIS — C3491 Malignant neoplasm of unspecified part of right bronchus or lung: Secondary | ICD-10-CM

## 2022-10-23 DIAGNOSIS — C3411 Malignant neoplasm of upper lobe, right bronchus or lung: Secondary | ICD-10-CM | POA: Insufficient documentation

## 2022-10-23 NOTE — Progress Notes (Signed)
[facility] [phone]   [phone]  PROGRESS NOTE FOR TELEMEDICINE VISITS  Brandon Quitter, PA [address] [place] [address]  I connected withNAME@ on 10/23/22 at  8:30 AM EDT by telephone visit and verified that I am speaking with the correct person using two identifiers.   I discussed the limitations, risks, security and privacy concerns of performing an evaluation and management service by telemedicine and the availability of in-person appointments. I also discussed with the patient that there may be a patient responsible charge related to this service. The patient expressed understanding and agreed to proceed.  Other persons participating in the visit and their role in the encounter:  wife  Patient's location: Home Provider's location: [facility]  DIAGNOSIS: Stage IIB (T1c, N1, M0) non-small cell lung cancer, poorly differentiated with sarcomatoid features.  PRIOR THERAPY: None  CURRENT THERAPY: Referral to surgery for evaluation before consideration of a course of concurrent chemoradiation.  INTERVAL HISTORY: Brandon Schultz 72 y.o. male has a telephone virtual visit with me today for evaluation and recommendation regarding his condition and discussion of the recent imaging studies.  [place] the beach and he is feeling fine except for the hot humid weather.  He denied having any current chest pain, shortness of breath but has mild cough with no hemoptysis.  He has no nausea, vomiting, diarrhea or constipation.  He has no headache or visual changes.  He has no recent weight loss or night sweats.  He had a PET scan as well as MRI of the brain and pulmonary function test performed recently.  MEDICAL HISTORY: Past Medical History:  Diagnosis Date   Allergy    seasonal   Arthritis    Asthma    Cataract    Phreesia 05/31/2020   Cervical stenosis of spine    Chronic kidney disease     Phreesia 05/31/2020   Colon polyps    Colon polyps    Complication of anesthesia    COPD (chronic obstructive pulmonary disease) (HCC)    Diverticulitis    Emphysema    Emphysema of lung (HCC)    Phreesia 05/31/2020   GERD (gastroesophageal reflux disease)    Glaucoma    Phreesia 05/31/2020   Gum disease    H/O degenerative disc disease    Hemorrhoids    Hyperlipidemia    Phreesia 05/31/2020   Inguinal hernia    Melanoma (HCC)    facial   PONV (postoperative nausea and vomiting)    Renal disease    Bright's Disease-childhood   Syncope    Umbilical hernia    Urinary tract infection     ALLERGIES:  has No Known Allergies.  MEDICATIONS:  Current Outpatient Medications  Medication Sig Dispense Refill   ibuprofen (ADVIL) 200 MG tablet Take 200 mg by mouth every 6 (six) hours as needed.     pantoprazole (PROTONIX) 40 MG tablet Take 1 tablet (40 mg total) by mouth 2 (two) times daily. 180 tablet 4   rosuvastatin (CRESTOR) 20 MG tablet Take 1 tablet (20 mg total) by mouth every evening.     timolol (TIMOPTIC) 0.5 % ophthalmic solution Place 1 drop into the left eye every morning.     No current facility-administered medications for this visit.    SURGICAL HISTORY:  Past Surgical History:  Procedure Laterality Date   BRONCHIAL BIOPSY  09/25/2022   Procedure: BRONCHIAL BIOPSIES;  Surgeon: Leslye Peer,  MD;  Location: [facility] ENDOSCOPY;  Service: Pulmonary;;   BRONCHIAL BRUSHINGS  09/25/2022   Procedure: BRONCHIAL BRUSHINGS;  Surgeon: Leslye Peer, MD;  Location: [facility] ENDOSCOPY;  Service: Pulmonary;;   BRONCHIAL NEEDLE ASPIRATION BIOPSY  09/25/2022   Procedure: BRONCHIAL NEEDLE ASPIRATION BIOPSIES;  Surgeon: Leslye Peer, MD;  Location: [facility] ENDOSCOPY;  Service: Pulmonary;;   cataract surgery Bilateral 11/2020   COLONOSCOPY  06/08/2021   07/27/2016   DENTAL SURGERY     FIDUCIAL MARKER PLACEMENT  09/25/2022   Procedure: FIDUCIAL MARKER PLACEMENT;  Surgeon: Leslye Peer, MD;   Location: [facility] ENDOSCOPY;  Service: Pulmonary;;   HERNIA REPAIR     Umbilical & Inguinal   left knee surgery     MELANOMA EXCISION     facial   neck and shoulder injection     POLYPECTOMY     WISDOM TOOTH EXTRACTION      REVIEW OF SYSTEMS:  A comprehensive review of systems was negative except for: Respiratory: positive for cough    LABORATORY DATA: Lab Results  Component Value Date   WBC 9.5 10/08/2022   HGB 15.1 10/08/2022   HCT 43.2 10/08/2022   MCV 88.5 10/08/2022   PLT 268 10/08/2022      Chemistry      Component Value Date/Time   NA 139 10/08/2022 1229   NA 140 06/14/2022 0904   K 4.0 10/08/2022 1229   CL 108 10/08/2022 1229   CO2 26 10/08/2022 1229   BUN 18 10/08/2022 1229   BUN 21 06/14/2022 0904   CREATININE 1.08 10/08/2022 1229   CREATININE 0.92 02/18/2013 1431      Component Value Date/Time   CALCIUM 9.6 10/08/2022 1229   ALKPHOS 50 10/08/2022 1229   AST 14 (L) 10/08/2022 1229   ALT 14 10/08/2022 1229   BILITOT 0.6 10/08/2022 1229       RADIOGRAPHIC STUDIES: NM PET Image Initial (PI) Skull Base To Thigh  Result Date: 10/15/2022 CLINICAL DATA:  Initial treatment strategy for non-small-cell lung cancer, staging. Bronchoscopy 09/25/2022 with biopsy. EXAM: NUCLEAR MEDICINE PET SKULL BASE TO THIGH TECHNIQUE: 12.0 mCi F-18 FDG was injected intravenously. Full-ring PET imaging was performed from the skull base to thigh after the radiotracer. CT data was obtained and used for attenuation correction and anatomic localization. Fasting blood glucose: 111 mg/dl COMPARISON:  81/19/1478 lung cancer screening due to. 08/20/2022 outside chest CT. No report. FINDINGS: Mediastinal blood pool activity: SUV max 2.4 Liver activity: SUV max NA NECK: No areas of abnormal hypermetabolism. Incidental CT findings: Bilateral carotid atherosclerosis. No cervical adenopathy. Minimal ethmoid air cell mucosal thickening. CHEST: Posterior right upper lobe nodule is less well-defined than on  06/29/2022, possibly secondary to biopsy related perilesional hemorrhage. Measures larger at 2.6 x 2.2 cm on 61/4 versus 2.1 x 1.7 cm on the prior exam. Hypermetabolic at a S.U.V. max of 10.0. Right suprahilar node measures 2.4 x 2.3 cm and a S.U.V. max of 18.3 on 66/4. Incidental CT findings: Aortic and coronary artery calcification. Moderate centrilobular emphysema. ABDOMEN/PELVIS: No abdominopelvic nodal hypermetabolism. Medial limb left adrenal gland nodule measures 1.9 cm and 8.8 HU. Mildly hypermetabolic at a S.U.V. max of 3.0. Incidental CT findings: Normal right adrenal gland. Upper pole right renal 3.9 cm fluid density lesion is likely a cyst . In the absence of clinically indicated signs/symptoms require(s) no independent follow-up. Abdominal aortic atherosclerosis. Extensive colonic diverticulosis. SKELETON: No abnormal marrow activity. Incidental CT findings: None. IMPRESSION: 1. Posterior right upper lobe primary bronchogenic  carcinoma with right suprahilar nodal metastasis. T1bN1M0 or stage IIB 2. Mildly hypermetabolic left adrenal adenoma. In the absence of clinically indicated signs/symptoms require(s) no independent follow-up. 3. Incidental findings, including: Aortic atherosclerosis (ICD10-I70.0), coronary artery atherosclerosis and emphysema (ICD10-J43.9). Electronically Signed   By: Jeronimo Greaves M.D.   On: 10/15/2022 09:04   MR BRAIN W WO CONTRAST  Result Date: 10/08/2022 CLINICAL DATA:  Provided history: Malignant neoplasm of unspecified part of unspecified bronchus or lung. Non-small cell lung cancer, staging. EXAM: MRI HEAD WITHOUT AND WITH CONTRAST TECHNIQUE: Multiplanar, multiecho pulse sequences of the brain and surrounding structures were obtained without and with intravenous contrast. CONTRAST:  9mL GADAVIST GADOBUTROL 1 MMOL/ML IV SOLN COMPARISON:  No pertinent prior exams available for comparison. FINDINGS: Brain: No age advanced or lobar predominant parenchymal atrophy. Multifocal  T2 FLAIR hyperintense signal abnormality within the cerebral white matter, nonspecific but compatible with mild-to-moderate chronic small vessel ischemic disease. There is no acute infarct. No evidence of an intracranial mass. No chronic intracranial blood products. No extra-axial fluid collection. No midline shift. No pathologic intracranial enhancement identified. Vascular: Maintained flow voids within the proximal large arterial vessels. Skull and upper cervical spine: No focal suspicious marrow lesion. Sinuses/Orbits: No mass or acute finding within the imaged orbits. Prior bilateral ocular lens replacement. Mild mucosal thickening within the left frontal and left sphenoid sinuses. Small mucous retention cyst within the left maxillary sinus. Other: Indeterminate 11 mm well-circumscribed ovoid FLAIR hyperintense lesion along the posterior aspect of the left pinna with possible thin peripheral enhancement (for instance as seen on series 18, image 6) (series 15, image 1). IMPRESSION: 1. No evidence of intracranial metastatic disease. 2. Mild-to-moderate chronic small vessel ischemic changes within the cerebral white matter. 3. Indeterminate 11 mm well-circumscribed lesion along the posterior aspect of the left pinna. Direct examination recommended. Electronically Signed   By: Jackey Loge D.O.   On: 10/08/2022 19:38   DG Chest Port 1 View  Result Date: 09/25/2022 CLINICAL DATA:  [id number] S/P bronchoscopy [id number] EXAM: PORTABLE CHEST 1 VIEW COMPARISON:  CXR 06/04/22 FINDINGS: No pleural effusion. No pneumothorax. No focal airspace opacity. Unchanged cardiac and mediastinal contours. No radiographically apparent displaced rib fractures. Visualized upper abdomen is unremarkable. IMPRESSION: No focal airspace opacity. Electronically Signed   By: Lorenza Cambridge M.D.   On: 09/25/2022 13:49   DG C-ARM BRONCHOSCOPY  Result Date: 09/25/2022 C-ARM BRONCHOSCOPY: Fluoroscopy was utilized by the requesting physician.  No  radiographic interpretation.    ASSESSMENT AND PLAN: This is a very pleasant 72 years old white male with a stage IIb (T1c, N1, M0) non-small cell lung cancer, poorly differentiated with sarcomatoid features presented with right lower lobe lung nodule in addition to right suprahilar lymph node diagnosed in June 2024. I had a lengthy discussion with the patient today about his current condition and treatment options. I still like to refer the patient to cardiothoracic surgery for evaluation and discussion of surgical option if the patient is a candidate but if he is not a candidate for this treatment he may benefit from a course of neoadjuvant chemoimmunotherapy followed by surgical resection or a course of concurrent chemoradiation if he is definitely not a surgical candidate even after the neoadjuvant course. I will see the patient back for follow-up visit in around 3 weeks for more discussion of his treatment options after his surgical consultation. The patient is in agreement with the current plan. He was advised to call immediately if he has any concerning symptoms  in the interval. I discussed the assessment and treatment plan with the patient. The patient was provided an opportunity to ask questions and all were answered. The patient agreed with the plan and demonstrated an understanding of the instructions.   The patient was advised to call back or seek an in-person evaluation if the symptoms worsen or if the condition fails to improve as anticipated.  I provided 15 minutes of non face-to-face telephone visit time during this encounter, and > 50% was spent counseling as documented under my assessment & plan.  Lajuana Matte, MD 10/23/2022 8:05 AM  Disclaimer: This note was dictated with voice recognition software. Similar sounding words can inadvertently be transcribed and may not be corrected upon review.

## 2022-10-29 ENCOUNTER — Encounter: Payer: Medicare PPO | Admitting: Thoracic Surgery (Cardiothoracic Vascular Surgery)

## 2022-10-31 ENCOUNTER — Institutional Professional Consult (permissible substitution) (INDEPENDENT_AMBULATORY_CARE_PROVIDER_SITE_OTHER): Payer: Medicare PPO | Admitting: Thoracic Surgery (Cardiothoracic Vascular Surgery)

## 2022-10-31 VITALS — BP 122/72 | HR 79 | Resp 20 | Ht 70.0 in | Wt 216.0 lb

## 2022-10-31 DIAGNOSIS — R911 Solitary pulmonary nodule: Secondary | ICD-10-CM

## 2022-10-31 NOTE — Progress Notes (Signed)
PCP is [name], PA Referring Provider is Si Gaul, MD  Chief Complaint  Patient presents with   Lung Cancer    Surgical consult, PET Scan 10/11/22/ PFT's 10/11/22/ MR Brain 10/08/22/ Bronch 09/25/22/ Chest CT 06/29/22    HPI: Brandon Schultz is sent for consultation regarding stage IIb non-small cell carcinoma of the right upper lobe.  Brandon Schultz is a 72 year old former smoker with a past medical Schultz significant for tobacco abuse, COPD, coronary calcification on CT, hyperlipidemia, melanoma, von Willebrand's disorder, chronic kidney disease, reflux, glaucoma, and arthritis.  He smoked about 2 packs a day for 40 years prior to quitting in 2016 (80 pack years).  He had a low-dose CT for lung cancer screening in March.  It showed a new spiculated nodule in the posterior right upper lobe.  There were multiple stable nodules that had been present previously.  He had a follow-up CT which showed the nodule was persistent.  He had a bronchoscopy with biopsy on 09/25/2022 which showed non-small cell carcinoma with sarcomatoid features.  A PET/CT was done on 10/11/2022 which showed the nodule is hypermetabolic.  There also was a hypermetabolic right hilar node.  CT and PET/CT also showed an adrenal adenoma.  MRI of the brain was negative for metastatic disease.  He denies any chest pain, pressure, tightness, or shortness of breath.  No change in appetite or weight loss.  Had the flu recently.  Does have a productive cough with clear sputum.  No wheezing.  No change in appetite or weight loss.  No unusual headaches or visual changes.  Zubrod Score: At the time of surgery this patient's most appropriate activity status/level should be described as: [x]     0    Normal activity, no symptoms []     1    Restricted in physical strenuous activity but ambulatory, able to do out light work []     2    Ambulatory and capable of self care, unable to do work activities, up and about >50 % of  waking hours                              []     3    Only limited self care, in bed greater than 50% of waking hours []     4    Completely disabled, no self care, confined to bed or chair []     5    Moribund   Past Medical Schultz:  Diagnosis Date   Allergy    seasonal   Arthritis    Asthma    Cataract    Phreesia 05/31/2020   Cervical stenosis of spine    Chronic kidney disease    Phreesia 05/31/2020   Colon polyps    Colon polyps    Complication of anesthesia    COPD (chronic obstructive pulmonary disease) (HCC)    Diverticulitis    Emphysema    Emphysema of lung (HCC)    Phreesia 05/31/2020   GERD (gastroesophageal reflux disease)    Glaucoma    Phreesia 05/31/2020   Gum disease    H/O degenerative disc disease    Hemorrhoids    Hyperlipidemia    Phreesia 05/31/2020   Inguinal hernia    Melanoma (HCC)    facial   PONV (postoperative nausea and vomiting)    Renal disease    Bright's Disease-childhood   Syncope    Umbilical hernia  Urinary tract infection     Past Surgical Schultz:  Procedure Laterality Date   BRONCHIAL BIOPSY  09/25/2022   Procedure: BRONCHIAL BIOPSIES;  Surgeon: Leslye Peer, MD;  Location: [facility] ENDOSCOPY;  Service: Pulmonary;;   BRONCHIAL BRUSHINGS  09/25/2022   Procedure: BRONCHIAL BRUSHINGS;  Surgeon: Leslye Peer, MD;  Location: [facility] ENDOSCOPY;  Service: Pulmonary;;   BRONCHIAL NEEDLE ASPIRATION BIOPSY  09/25/2022   Procedure: BRONCHIAL NEEDLE ASPIRATION BIOPSIES;  Surgeon: Leslye Peer, MD;  Location: [facility] ENDOSCOPY;  Service: Pulmonary;;   cataract surgery Bilateral 11/2020   COLONOSCOPY  06/08/2021   07/27/2016   DENTAL SURGERY     FIDUCIAL MARKER PLACEMENT  09/25/2022   Procedure: FIDUCIAL MARKER PLACEMENT;  Surgeon: Leslye Peer, MD;  Location: [facility] ENDOSCOPY;  Service: Pulmonary;;   HERNIA REPAIR     Umbilical & Inguinal   left knee surgery     MELANOMA EXCISION     facial   neck and shoulder injection     POLYPECTOMY      WISDOM TOOTH EXTRACTION      Family Schultz  Problem Relation Age of Onset   Colon cancer Mother    Heart attack Father        Deceased-Early 16s   Syncope episode Sister    Healthy Sister        x1   Dementia Maternal Grandmother    Cancer Maternal Grandfather    Healthy Daughter        x2   Healthy Son        x2   Colon polyps Neg Hx    Rectal cancer Neg Hx    Stomach cancer Neg Hx     Social Schultz Social Schultz   Tobacco Use   Smoking status: Former    Current packs/day: 0.00    Average packs/day: 2.0 packs/day for 40.0 years (80.0 ttl pk-yrs)    Types: Cigarettes    Start date: 04/16/1974    Quit date: 04/16/2014    Years since quitting: 8.5   Smokeless tobacco: Former  Building services engineer status: Never Used  Substance Use Topics   Alcohol use: Yes    Alcohol/week: 14.0 standard drinks of alcohol    Types: 14 Cans of beer per week    Comment: 2 beers a day maybe    Drug use: No    Current Outpatient Medications  Medication Sig Dispense Refill   ibuprofen (ADVIL) 200 MG tablet Take 200 mg by mouth every 6 (six) hours as needed.     pantoprazole (PROTONIX) 40 MG tablet Take 1 tablet (40 mg total) by mouth 2 (two) times daily. 180 tablet 4   rosuvastatin (CRESTOR) 20 MG tablet Take 1 tablet (20 mg total) by mouth every evening.     timolol (TIMOPTIC) 0.5 % ophthalmic solution Place 1 drop into the left eye every morning.     No current facility-administered medications for this visit.    No Known Allergies  Review of Systems  Constitutional:  Negative for activity change, appetite change, fatigue and unexpected weight change.  HENT:  Positive for hearing loss. Negative for trouble swallowing and voice change.   Eyes:  Negative for visual disturbance.  Respiratory:  Positive for cough. Negative for shortness of breath and wheezing.   Cardiovascular:  Negative for chest pain, palpitations and leg swelling.  Gastrointestinal:  Positive for abdominal pain  (reflux).  Genitourinary:  Negative for difficulty urinating and dysuria.  Musculoskeletal:  Positive for  arthralgias and joint swelling.  Neurological:  Negative for seizures, syncope and weakness.  Hematological:  Bruises/bleeds easily (von Willibrands).  All other systems reviewed and are negative.   BP 122/72   Pulse 79   Resp 20   Ht 5\' 10"  (1.778 m)   Wt 216 lb (98 kg)   SpO2 93% Comment: RA  BMI 30.99 kg/m  Physical Exam Vitals reviewed.  Constitutional:      General: He is not in acute distress.    Appearance: Normal appearance.  HENT:     Head: Normocephalic and atraumatic.  Eyes:     General: No scleral icterus.    Extraocular Movements: Extraocular movements intact.  Neck:     Vascular: No carotid bruit.  Cardiovascular:     Rate and Rhythm: Normal rate and regular rhythm.     Heart sounds: Normal heart sounds. No murmur heard.    No friction rub. No gallop.  Pulmonary:     Effort: Pulmonary effort is normal. No respiratory distress.     Breath sounds: Normal breath sounds. No wheezing or rales.  Abdominal:     General: There is no distension.     Palpations: Abdomen is soft.  Musculoskeletal:     Cervical back: Neck supple.  Lymphadenopathy:     Cervical: No cervical adenopathy.  Skin:    General: Skin is warm and dry.  Neurological:     General: No focal deficit present.     Mental Status: He is alert and oriented to person, place, and time.     Cranial Nerves: No cranial nerve deficit.     Motor: No weakness.    Diagnostic Tests: NUCLEAR MEDICINE PET SKULL BASE TO THIGH   TECHNIQUE: 12.0 mCi F-18 FDG was injected intravenously. Full-ring PET imaging was performed from the skull base to thigh after the radiotracer. CT data was obtained and used for attenuation correction and anatomic localization.   Fasting blood glucose: 111 mg/dl   COMPARISON:  29/56/2130 lung cancer screening due to. 08/20/2022 outside chest CT. No report.    FINDINGS: Mediastinal blood pool activity: SUV max 2.4   Liver activity: SUV max NA   NECK: No areas of abnormal hypermetabolism.   Incidental CT findings: Bilateral carotid atherosclerosis. No cervical adenopathy. Minimal ethmoid air cell mucosal thickening.   CHEST: Posterior right upper lobe nodule is less well-defined than on 06/29/2022, possibly secondary to biopsy related perilesional hemorrhage. Measures larger at 2.6 x 2.2 cm on 61/4 versus 2.1 x 1.7 cm on the prior exam. Hypermetabolic at a S.U.V. max of 10.0.   Right suprahilar node measures 2.4 x 2.3 cm and a S.U.V. max of 18.3 on 66/4.   Incidental CT findings: Aortic and coronary artery calcification. Moderate centrilobular emphysema.   ABDOMEN/PELVIS: No abdominopelvic nodal hypermetabolism. Medial limb left adrenal gland nodule measures 1.9 cm and 8.8 HU. Mildly hypermetabolic at a S.U.V. max of 3.0.   Incidental CT findings: Normal right adrenal gland. Upper pole right renal 3.9 cm fluid density lesion is likely a cyst . In the absence of clinically indicated signs/symptoms require(s) no independent follow-up. Abdominal aortic atherosclerosis. Extensive colonic diverticulosis.   SKELETON: No abnormal marrow activity.   Incidental CT findings: None.   IMPRESSION: 1. Posterior right upper lobe primary bronchogenic carcinoma with right suprahilar nodal metastasis. T1bN1M0 or stage IIB 2. Mildly hypermetabolic left adrenal adenoma. In the absence of clinically indicated signs/symptoms require(s) no independent follow-up. 3. Incidental findings, including: Aortic atherosclerosis (ICD10-I70.0), coronary artery atherosclerosis  and emphysema (ICD10-J43.9).     Electronically Signed   By: Jeronimo Greaves M.D.   On: 10/15/2022 09:04   Pulmonary function testing 10/11/2022 FVC 5.18 (117%) FEV1 3.74 (114%) DLCO 20.64 (79%)  Impression: Brandon Schultz is a 72 year old former smoker with a past medical  Schultz significant for tobacco abuse, COPD, coronary calcification on CT, hyperlipidemia, melanoma, von Willebrand's disorder, chronic kidney disease, reflux, glaucoma, and arthritis.  Found to have a new right upper lobe lung nodule on a low-dose screening CT for lung cancer.  Biopsy showed a poorly differentiated non-small cell carcinoma of the lung.  PET/CT showed stage IIb disease with involvement of a right hilar node.  No evidence of distant metastases.  We discussed the management of stage IIb lung cancer.  The noted in his case is a level 10 node at the takeoff of the right mainstem.  I do not think even with a pneumonectomy we would achieve a clear margin currently.  We discussed the options of definitive treatment with chemo and radiation versus neoadjuvant chemoimmunotherapy followed by surgical resection.  We discussed the relative advantages and disadvantages of each approach.  I recommended that we proceed with chemoimmunotherapy and then restaging for consideration for resection.  If not appropriate, he could then have chemoradiation.  Tobacco abuse-Schultz of heavy abuse, but quit in 2016.  Emphysema-was pretty impressive emphysema on CT but preserved pulmonary function at least in terms of flows.  Mild decrease in diffusion capacity.  He has adequate pulmonary reserve to tolerate a resection.  Coronary calcification by CT-asymptomatic with good exercise tolerance.  No indication for additional workup at this time.  He is on Crestor.  Plan: Refer back to Dr. Arbutus Ped for chemoimmunotherapy I will see him back after his restaging scan.  He needs a CT with IV contrast for restaging so we can better evaluate the hilar lymph node.  I spent over 45 minutes in review of records, images, and in consultation with Mr. Balaguer today.  [name], MD [facility] [phone]

## 2022-11-14 ENCOUNTER — Encounter: Payer: Self-pay | Admitting: Internal Medicine

## 2022-11-14 ENCOUNTER — Other Ambulatory Visit: Payer: Self-pay

## 2022-11-14 ENCOUNTER — Inpatient Hospital Stay (HOSPITAL_BASED_OUTPATIENT_CLINIC_OR_DEPARTMENT_OTHER): Payer: Medicare PPO

## 2022-11-14 ENCOUNTER — Inpatient Hospital Stay: Payer: Medicare PPO | Admitting: Internal Medicine

## 2022-11-14 VITALS — BP 117/79 | HR 72 | Temp 97.6°F | Resp 17 | Ht 70.0 in | Wt 214.0 lb

## 2022-11-14 DIAGNOSIS — C3491 Malignant neoplasm of unspecified part of right bronchus or lung: Secondary | ICD-10-CM | POA: Insufficient documentation

## 2022-11-14 DIAGNOSIS — C3411 Malignant neoplasm of upper lobe, right bronchus or lung: Secondary | ICD-10-CM | POA: Diagnosis not present

## 2022-11-14 LAB — CBC WITH DIFFERENTIAL (CANCER CENTER ONLY)
Abs Immature Granulocytes: 0.02 10*3/uL (ref 0.00–0.07)
Basophils Absolute: 0 10*3/uL (ref 0.0–0.1)
Basophils Relative: 0 %
Eosinophils Absolute: 0.4 10*3/uL (ref 0.0–0.5)
Eosinophils Relative: 4 %
HCT: 43.9 % (ref 39.0–52.0)
Hemoglobin: 15.3 g/dL (ref 13.0–17.0)
Immature Granulocytes: 0 %
Lymphocytes Relative: 37 %
Lymphs Abs: 3.4 10*3/uL (ref 0.7–4.0)
MCH: 29.7 pg (ref 26.0–34.0)
MCHC: 34.9 g/dL (ref 30.0–36.0)
MCV: 85.2 fL (ref 80.0–100.0)
Monocytes Absolute: 0.8 10*3/uL (ref 0.1–1.0)
Monocytes Relative: 8 %
Neutro Abs: 4.7 10*3/uL (ref 1.7–7.7)
Neutrophils Relative %: 51 %
Platelet Count: 268 10*3/uL (ref 150–400)
RBC: 5.15 MIL/uL (ref 4.22–5.81)
RDW: 12.7 % (ref 11.5–15.5)
WBC Count: 9.3 10*3/uL (ref 4.0–10.5)
nRBC: 0 % (ref 0.0–0.2)

## 2022-11-14 LAB — CMP (CANCER CENTER ONLY)
ALT: 15 U/L (ref 0–44)
AST: 15 U/L (ref 15–41)
Albumin: 4.5 g/dL (ref 3.5–5.0)
Alkaline Phosphatase: 48 U/L (ref 38–126)
Anion gap: 6 (ref 5–15)
BUN: 18 mg/dL (ref 8–23)
CO2: 26 mmol/L (ref 22–32)
Calcium: 9.9 mg/dL (ref 8.9–10.3)
Chloride: 107 mmol/L (ref 98–111)
Creatinine: 1.01 mg/dL (ref 0.61–1.24)
GFR, Estimated: >60 mL/min (ref >60)
Glucose, Bld: 96 mg/dL (ref 70–99)
Potassium: 3.8 mmol/L (ref 3.5–5.1)
Sodium: 139 mmol/L (ref 135–145)
Total Bilirubin: 0.6 mg/dL (ref 0.3–1.2)
Total Protein: 7.3 g/dL (ref 6.5–8.1)

## 2022-11-14 MED ORDER — ONDANSETRON HCL 8 MG PO TABS
8.0000 mg | ORAL_TABLET | Freq: Three times a day (TID) | ORAL | 1 refills | Status: DC | PRN
Start: 2022-11-14 — End: 2023-02-28

## 2022-11-14 NOTE — Progress Notes (Signed)
[facility] [phone]   [phone]  OFFICE PROGRESS NOTE  [name], PA [address] [place] [address]  DIAGNOSIS: Stage IIb (T1c, N1, M0) non-small cell lung cancer, poorly differentiated with sarcomatoid features.  PRIOR THERAPY: None   CURRENT THERAPY: Neoadjuvant chemoimmunotherapy with carboplatin for AUC of 6, paclitaxel 200 Mg/M2 and nivolumab 360 mg IV every 3 weeks with Neulasta support.  First dose 11/21/2022.  INTERVAL HISTORY: Brandon Schultz 72 y.o. male returns to the clinic today for follow-up visit accompanied by his wife and sister.  The patient is feeling fine today with no concerning complaints.  He had flu symptoms few weeks ago but he is recovering well except for some residual cough with clear sputum.  He has no current chest pain, shortness of breath, or hemoptysis.  He has no nausea, vomiting, diarrhea or constipation.  He has no headache or visual changes.  He has no recent weight loss or night sweats.  He had a PET scan and MRI of the brain performed several weeks ago and that showed hypermetabolic right upper lobe lung nodule consistent with the bronchogenic carcinoma in addition to right suprahilar nodal metastasis.  His brain MRI was negative for malignancy.  The patient was seen by Dr. Dorris Fetch for surgical evaluation.  He did not feel that the patient will be a good surgical candidate at this point because of the location of the tumor but he recommended consideration of neoadjuvant chemoimmunotherapy followed by evaluation for surgery.  He is here today for evaluation and discussion of this option.  MEDICAL HISTORY: Past Medical History:  Diagnosis Date   Allergy    seasonal   Arthritis    Asthma    Cataract    Phreesia 05/31/2020   Cervical stenosis of spine    Chronic kidney disease    [employer]reesia 05/31/2020   Colon polyps    Colon polyps    Complication of anesthesia    COPD (chronic  obstructive pulmonary disease) (HCC)    Diverticulitis    Emphysema    Emphysema of lung (HCC)    [employer]reesia 05/31/2020   GERD (gastroesophageal reflux disease)    Glaucoma    [employer]reesia 05/31/2020   Gum disease    H/O degenerative disc disease    Hemorrhoids    Hyperlipidemia    [employer]reesia 05/31/2020   Inguinal hernia    Melanoma (HCC)    facial   PONV (postoperative nausea and vomiting)    Renal disease    Bright's Disease-childhood   Syncope    Umbilical hernia    Urinary tract infection     ALLERGIES:  has No Known Allergies.  MEDICATIONS:  Current Outpatient Medications  Medication Sig Dispense Refill   ibuprofen (ADVIL) 200 MG tablet Take 200 mg by mouth every 6 (six) hours as needed.     pantoprazole (PROTONIX) 40 MG tablet Take 1 tablet (40 mg total) by mouth 2 (two) times daily. 180 tablet 4   rosuvastatin (CRESTOR) 20 MG tablet Take 1 tablet (20 mg total) by mouth every evening.     timolol (TIMOPTIC) 0.5 % ophthalmic solution Place 1 drop into the left eye every morning.     No current facility-administered medications for this visit.    SURGICAL HISTORY:  Past Surgical History:  Procedure Laterality Date   BRONCHIAL BIOPSY  09/25/2022   Procedure: BRONCHIAL BIOPSIES;  Surgeon: Leslye Peer, MD;  Location: [facility] ENDOSCOPY;  Service: Pulmonary;;   BRONCHIAL BRUSHINGS  09/25/2022   Procedure: BRONCHIAL BRUSHINGS;  Surgeon: Leslye Peer, MD;  Location: [facility] ENDOSCOPY;  Service: Pulmonary;;   BRONCHIAL NEEDLE ASPIRATION BIOPSY  09/25/2022   Procedure: BRONCHIAL NEEDLE ASPIRATION BIOPSIES;  Surgeon: Leslye Peer, MD;  Location: [facility] ENDOSCOPY;  Service: Pulmonary;;   cataract surgery Bilateral 11/2020   COLONOSCOPY  06/08/2021   07/27/2016   DENTAL SURGERY     FIDUCIAL MARKER PLACEMENT  09/25/2022   Procedure: FIDUCIAL MARKER PLACEMENT;  Surgeon: Leslye Peer, MD;  Location: [facility] ENDOSCOPY;  Service: Pulmonary;;   HERNIA REPAIR     Umbilical & Inguinal   left knee  surgery     MELANOMA EXCISION     facial   neck and shoulder injection     POLYPECTOMY     WISDOM TOOTH EXTRACTION      REVIEW OF SYSTEMS:  Constitutional: negative Eyes: negative Ears, nose, mouth, throat, and face: negative Respiratory: positive for cough Cardiovascular: negative Gastrointestinal: negative Genitourinary:negative Integument/breast: negative Hematologic/lymphatic: negative Musculoskeletal:negative Neurological: negative Behavioral/Psych: negative Endocrine: negative Allergic/Immunologic: negative   PHYSICAL EXAMINATION: General appearance: alert, cooperative, and no distress Head: Normocephalic, without obvious abnormality, atraumatic Neck: no adenopathy, no JVD, supple, symmetrical, trachea midline, and thyroid not enlarged, symmetric, no tenderness/mass/nodules Lymph nodes: Cervical, supraclavicular, and axillary nodes normal. Resp: clear to auscultation bilaterally Back: symmetric, no curvature. ROM normal. No CVA tenderness. Cardio: regular rate and rhythm, S1, S2 normal, no murmur, click, rub or gallop GI: soft, non-tender; bowel sounds normal; no masses,  no organomegaly Extremities: extremities normal, atraumatic, no cyanosis or edema Neurologic: Alert and oriented X 3, normal strength and tone. Normal symmetric reflexes. Normal coordination and gait  ECOG PERFORMANCE STATUS: 1 - Symptomatic but completely ambulatory  Blood pressure 117/79, pulse 72, temperature 97.6 F (36.4 C), temperature source Oral, resp. rate 17, height 5\' 10"  (1.778 m), weight 214 lb (97.1 kg), SpO2 98%.  LABORATORY DATA: Lab Results  Component Value Date   WBC 9.3 11/14/2022   HGB 15.3 11/14/2022   HCT 43.9 11/14/2022   MCV 85.2 11/14/2022   PLT 268 11/14/2022      Chemistry      Component Value Date/Time   NA 139 10/08/2022 1229   NA 140 06/14/2022 0904   K 4.0 10/08/2022 1229   CL 108 10/08/2022 1229   CO2 26 10/08/2022 1229   BUN 18 10/08/2022 1229   BUN 21  06/14/2022 0904   CREATININE 1.08 10/08/2022 1229   CREATININE 0.92 02/18/2013 1431      Component Value Date/Time   CALCIUM 9.6 10/08/2022 1229   ALKPHOS 50 10/08/2022 1229   AST 14 (L) 10/08/2022 1229   ALT 14 10/08/2022 1229   BILITOT 0.6 10/08/2022 1229       RADIOGRAPHIC STUDIES: No results found.  ASSESSMENT AND PLAN: This is a very pleasant 73 years old white male diagnosed with stage IIb (T1c, N1, M0) non-small cell lung cancer, poorly differentiated with sarcomatoid features presented with right upper lobe lung nodule in addition to right suprahilar nodal metastasis diagnosed in June 2024. The patient had previous imaging studies including MRI of the brain and PET scan that showed no other evidence of metastatic disease. He was seen by Dr. Dorris Fetch for surgical evaluation and he felt that the patient may benefit from a course of neoadjuvant chemoimmunotherapy first before consideration of surgical resection. I had a lengthy discussion with the patient and his family today about his current condition  and treatment options.  He had poorly differentiated carcinoma with sarcomatoid features and I felt that the patient may benefit from treatment with a course of carboplatin for AUC of 6, paclitaxel 200 Mg/M2 and nivolumab 360 mg IV every 3 weeks with Neulasta support for 3 doses.  I discussed with the patient the adverse effect of this treatment including but not limited to alopecia, low suppression, nausea and vomiting, peripheral neuropathy, liver or renal dysfunction as well as immunotherapy adverse effects. He is very interested in proceeding with the treatment as soon as possible and he is expected to start the first cycle of this treatment next week. The patient will have a chemotherapy education class before the first dose of his treatment. I will call his pharmacy with prescription for Zofran 8 mg p.o. every 8 hours as needed for nausea. The patient will come back for follow-up  visit in 2 weeks for evaluation and management of any adverse effect of his treatment. He was advised to call immediately if he has any other concerning symptoms in the interval. The patient voices understanding of current disease status and treatment options and is in agreement with the current care plan.  All questions were answered. The patient knows to call the clinic with any problems, questions or concerns. We can certainly see the patient much sooner if necessary.  The total time spent in the appointment was 30 minutes.  Disclaimer: This note was dictated with voice recognition software. Similar sounding words can inadvertently be transcribed and may not be corrected upon review.

## 2022-11-14 NOTE — Progress Notes (Signed)
START ON PATHWAY REGIMEN - Non-Small Cell Lung     A cycle is every 21 days:     Nivolumab      Gemcitabine      Cisplatin   **Always confirm dose/schedule in your pharmacy ordering system**  Patient Characteristics: Preoperative or Nonsurgical Candidate (Clinical Staging), Stage II, Surgical Candidate, EGFR Negative and ALK Negative, AND Neoadjuvant Chemo/Immunotherapy Preferred, Squamous Cell Therapeutic Status: Preoperative or Nonsurgical Candidate (Clinical Staging) AJCC T Category: cT1c AJCC N Category: cN1 AJCC M Category: cM0 AJCC 8 Stage Grouping: IIB ALK Fusion/Rearrangement Status: Negative Neoadjuvant Chemo/Immunotherapy Preferred<= Yes EGFR Mutation Status: Negative/Wild Type Histology: Squamous Cell Intent of Therapy: Curative Intent, Discussed with Patient

## 2022-11-15 ENCOUNTER — Encounter: Payer: Self-pay | Admitting: Internal Medicine

## 2022-11-15 ENCOUNTER — Other Ambulatory Visit: Payer: Self-pay

## 2022-11-15 NOTE — Progress Notes (Signed)
Pharmacist Chemotherapy Monitoring - Initial Assessment    Anticipated start date: 11/21/22   The following has been reviewed per standard work regarding the patient's treatment regimen: The patient's diagnosis, treatment plan and drug doses, and organ/hematologic function Lab orders and baseline tests specific to treatment regimen  The treatment plan start date, drug sequencing, and pre-medications Prior authorization status  Patient's documented medication list, including drug-drug interaction screen and prescriptions for anti-emetics and supportive care specific to the treatment regimen The drug concentrations, fluid compatibility, administration routes, and timing of the medications to be used The patient's access for treatment and lifetime cumulative dose history, if applicable  The patient's medication allergies and previous infusion related reactions, if applicable   Changes made to treatment plan:  N/A  Follow up needed:  Pending authorization for treatment    Jerry Caras, PharmD PGY2 Oncology Pharmacy Resident

## 2022-11-20 ENCOUNTER — Other Ambulatory Visit: Payer: Self-pay

## 2022-11-20 ENCOUNTER — Other Ambulatory Visit: Payer: Self-pay | Admitting: Medical Oncology

## 2022-11-20 ENCOUNTER — Inpatient Hospital Stay: Payer: Medicare PPO | Attending: Physician Assistant

## 2022-11-20 DIAGNOSIS — Z5111 Encounter for antineoplastic chemotherapy: Secondary | ICD-10-CM | POA: Insufficient documentation

## 2022-11-20 DIAGNOSIS — R112 Nausea with vomiting, unspecified: Secondary | ICD-10-CM

## 2022-11-20 DIAGNOSIS — C3411 Malignant neoplasm of upper lobe, right bronchus or lung: Secondary | ICD-10-CM | POA: Insufficient documentation

## 2022-11-20 DIAGNOSIS — Z79899 Other long term (current) drug therapy: Secondary | ICD-10-CM | POA: Insufficient documentation

## 2022-11-20 MED ORDER — PROCHLORPERAZINE MALEATE 10 MG PO TABS
10.0000 mg | ORAL_TABLET | Freq: Four times a day (QID) | ORAL | 0 refills | Status: DC | PRN
Start: 2022-11-20 — End: 2023-02-28

## 2022-11-20 MED FILL — Fosaprepitant Dimeglumine For IV Infusion 150 MG (Base Eq): INTRAVENOUS | Qty: 5 | Status: AC

## 2022-11-20 MED FILL — Dexamethasone Sodium Phosphate Inj 100 MG/10ML: INTRAMUSCULAR | Qty: 1 | Status: AC

## 2022-11-21 ENCOUNTER — Inpatient Hospital Stay: Payer: Medicare PPO

## 2022-11-21 ENCOUNTER — Other Ambulatory Visit: Payer: Self-pay

## 2022-11-21 VITALS — BP 130/72 | HR 69 | Temp 98.0°F | Resp 16 | Ht 70.0 in | Wt 216.5 lb

## 2022-11-21 DIAGNOSIS — Z79899 Other long term (current) drug therapy: Secondary | ICD-10-CM | POA: Diagnosis not present

## 2022-11-21 DIAGNOSIS — Z5111 Encounter for antineoplastic chemotherapy: Secondary | ICD-10-CM | POA: Diagnosis not present

## 2022-11-21 DIAGNOSIS — C3411 Malignant neoplasm of upper lobe, right bronchus or lung: Secondary | ICD-10-CM | POA: Diagnosis not present

## 2022-11-21 DIAGNOSIS — C3491 Malignant neoplasm of unspecified part of right bronchus or lung: Secondary | ICD-10-CM

## 2022-11-21 LAB — CMP (CANCER CENTER ONLY)
ALT: 17 U/L (ref 0–44)
AST: 16 U/L (ref 15–41)
Albumin: 4.5 g/dL (ref 3.5–5.0)
Alkaline Phosphatase: 50 U/L (ref 38–126)
Anion gap: 7 (ref 5–15)
BUN: 14 mg/dL (ref 8–23)
CO2: 24 mmol/L (ref 22–32)
Calcium: 9.5 mg/dL (ref 8.9–10.3)
Chloride: 109 mmol/L (ref 98–111)
Creatinine: 0.94 mg/dL (ref 0.61–1.24)
GFR, Estimated: >60 mL/min (ref >60)
Glucose, Bld: 95 mg/dL (ref 70–99)
Potassium: 4.1 mmol/L (ref 3.5–5.1)
Sodium: 140 mmol/L (ref 135–145)
Total Bilirubin: 0.8 mg/dL (ref 0.3–1.2)
Total Protein: 7.2 g/dL (ref 6.5–8.1)

## 2022-11-21 LAB — CBC WITH DIFFERENTIAL (CANCER CENTER ONLY)
Abs Immature Granulocytes: 0.02 10*3/uL (ref 0.00–0.07)
Basophils Absolute: 0.1 10*3/uL (ref 0.0–0.1)
Basophils Relative: 1 %
Eosinophils Absolute: 0.5 10*3/uL (ref 0.0–0.5)
Eosinophils Relative: 6 %
HCT: 44.2 % (ref 39.0–52.0)
Hemoglobin: 15.2 g/dL (ref 13.0–17.0)
Immature Granulocytes: 0 %
Lymphocytes Relative: 36 %
Lymphs Abs: 3 10*3/uL (ref 0.7–4.0)
MCH: 29.9 pg (ref 26.0–34.0)
MCHC: 34.4 g/dL (ref 30.0–36.0)
MCV: 86.8 fL (ref 80.0–100.0)
Monocytes Absolute: 0.7 10*3/uL (ref 0.1–1.0)
Monocytes Relative: 9 %
Neutro Abs: 4.1 10*3/uL (ref 1.7–7.7)
Neutrophils Relative %: 48 %
Platelet Count: 259 10*3/uL (ref 150–400)
RBC: 5.09 MIL/uL (ref 4.22–5.81)
RDW: 12.8 % (ref 11.5–15.5)
WBC Count: 8.4 10*3/uL (ref 4.0–10.5)
nRBC: 0 % (ref 0.0–0.2)

## 2022-11-21 MED ORDER — SODIUM CHLORIDE 0.9 % IV SOLN
702.6000 mg | Freq: Once | INTRAVENOUS | Status: AC
  Administered 2022-11-21: 700 mg via INTRAVENOUS
  Filled 2022-11-21: qty 70

## 2022-11-21 MED ORDER — SODIUM CHLORIDE 0.9 % IV SOLN
200.0000 mg/m2 | Freq: Once | INTRAVENOUS | Status: AC
  Administered 2022-11-21: 438 mg via INTRAVENOUS
  Filled 2022-11-21: qty 73

## 2022-11-21 MED ORDER — FAMOTIDINE IN NACL 20-0.9 MG/50ML-% IV SOLN
20.0000 mg | Freq: Once | INTRAVENOUS | Status: AC
  Administered 2022-11-21: 20 mg via INTRAVENOUS
  Filled 2022-11-21: qty 50

## 2022-11-21 MED ORDER — DIPHENHYDRAMINE HCL 50 MG/ML IJ SOLN
50.0000 mg | Freq: Once | INTRAMUSCULAR | Status: AC
  Administered 2022-11-21: 50 mg via INTRAVENOUS
  Filled 2022-11-21: qty 1

## 2022-11-21 MED ORDER — SODIUM CHLORIDE 0.9 % IV SOLN
150.0000 mg | Freq: Once | INTRAVENOUS | Status: AC
  Administered 2022-11-21: 150 mg via INTRAVENOUS
  Filled 2022-11-21: qty 150

## 2022-11-21 MED ORDER — SODIUM CHLORIDE 0.9 % IV SOLN
360.0000 mg | Freq: Once | INTRAVENOUS | Status: AC
  Administered 2022-11-21: 360 mg via INTRAVENOUS
  Filled 2022-11-21: qty 24

## 2022-11-21 MED ORDER — SODIUM CHLORIDE 0.9 % IV SOLN: Freq: Once | INTRAVENOUS | Status: AC

## 2022-11-21 MED ORDER — SODIUM CHLORIDE 0.9 % IV SOLN
10.0000 mg | Freq: Once | INTRAVENOUS | Status: AC
  Administered 2022-11-21: 10 mg via INTRAVENOUS
  Filled 2022-11-21: qty 10

## 2022-11-21 MED ORDER — PALONOSETRON HCL INJECTION 0.25 MG/5ML
0.2500 mg | Freq: Once | INTRAVENOUS | Status: AC
  Administered 2022-11-21: 0.25 mg via INTRAVENOUS
  Filled 2022-11-21: qty 5

## 2022-11-21 NOTE — Patient Instructions (Signed)
[facility]  Discharge Instructions: Thank you for choosing [facility] to provide your oncology and hematology care.   If you have a lab appointment with the Cancer Center, please go directly to the Cancer Center and check in at the registration area.   Wear comfortable clothing and clothing appropriate for easy access to any Portacath or PICC line.   We strive to give you quality time with your provider. You may need to reschedule your appointment if you arrive late (15 or more minutes).  Arriving late affects you and other patients whose appointments are after yours.  Also, if you miss three or more appointments without notifying the office, you may be dismissed from the clinic at the provider's discretion.      For prescription refill requests, have your pharmacy contact our office and allow 72 hours for refills to be completed.    Today you received the following chemotherapy and/or immunotherapy agents Nivolumab, Paclitaxel, Carboplatin      To help prevent nausea and vomiting after your treatment, we encourage you to take your nausea medication as directed.  BELOW ARE SYMPTOMS THAT SHOULD BE REPORTED IMMEDIATELY: *FEVER GREATER THAN 100.4 F (38 C) OR HIGHER *CHILLS OR SWEATING *NAUSEA AND VOMITING THAT IS NOT CONTROLLED WITH YOUR NAUSEA MEDICATION *UNUSUAL SHORTNESS OF BREATH *UNUSUAL BRUISING OR BLEEDING *URINARY PROBLEMS (pain or burning when urinating, or frequent urination) *BOWEL PROBLEMS (unusual diarrhea, constipation, pain near the anus) TENDERNESS IN MOUTH AND THROAT WITH OR WITHOUT PRESENCE OF ULCERS (sore throat, sores in mouth, or a toothache) UNUSUAL RASH, SWELLING OR PAIN  UNUSUAL VAGINAL DISCHARGE OR ITCHING   Items with * indicate a potential emergency and should be followed up as soon as possible or go to the Emergency Department if any problems should occur.  Please show the CHEMOTHERAPY ALERT CARD or  IMMUNOTHERAPY ALERT CARD at check-in to the Emergency Department and triage nurse.  Should you have questions after your visit or need to cancel or reschedule your appointment, please contact [facility]  Dept: [phone]  and follow the prompts.  Office hours are 8:00 a.m. to 4:30 p.m. Monday - Friday. Please note that voicemails left after 4:00 p.m. may not be returned until the following business day.  We are closed weekends and major holidays. You have access to a nurse at all times for urgent questions. Please call the main number to the clinic Dept: [phone] and follow the prompts.   For any non-urgent questions, you may also contact your provider using MyChart. We now offer e-Visits for anyone 39 and older to request care online for non-urgent symptoms. For details visit [email or web address].   Also download the MyChart app! Go to the app store, search "MyChart", open the app, select [facility], and log in with your MyChart username and password.  Nivolumab Injection What is this medication? NIVOLUMAB (nye VOL ue mab) treats some types of cancer. It works by helping your immune system slow or stop the spread of cancer cells. It is a monoclonal antibody. This medicine may be used for other purposes; ask your health care provider or pharmacist if you have questions. COMMON BRAND NAME(S): Opdivo What should I tell my care team before I take this medication? They need to know if you have any of these conditions: Allogeneic stem cell transplant (uses someone else's stem cells) Autoimmune diseases, such as Crohn disease, ulcerative colitis, lupus History of chest radiation  Nervous system problems, such as Guillain-Barre syndrome or myasthenia gravis Organ transplant An unusual or allergic reaction to nivolumab, other medications, foods, dyes, or preservatives Pregnant or trying to get pregnant Breast-feeding How should I use this  medication? This medication is infused into a vein. It is given in a hospital or clinic setting. A special MedGuide will be given to you before each treatment. Be sure to read this information carefully each time. Talk to your care team about the use of this medication in children. While it may be prescribed for children as young as 12 years for selected conditions, precautions do apply. Overdosage: If you think you have taken too much of this medicine contact a poison control center or emergency room at once. NOTE: This medicine is only for you. Do not share this medicine with others. What if I miss a dose? Keep appointments for follow-up doses. It is important not to miss your dose. Call your care team if you are unable to keep an appointment. What may interact with this medication? Interactions have not been studied. This list may not describe all possible interactions. Give your health care provider a list of all the medicines, herbs, non-prescription drugs, or dietary supplements you use. Also tell them if you smoke, drink alcohol, or use illegal drugs. Some items may interact with your medicine. What should I watch for while using this medication? Your condition will be monitored carefully while you are receiving this medication. You may need blood work while taking this medication. This medication may cause serious skin reactions. They can happen weeks to months after starting the medication. Contact your care team right away if you notice fevers or flu-like symptoms with a rash. The rash may be red or purple and then turn into blisters or peeling of the skin. You may also notice a red rash with swelling of the face, lips, or lymph nodes in your neck or under your arms. Tell your care team right away if you have any change in your eyesight. Talk to your care team if you are pregnant or think you might be pregnant. A negative pregnancy test is required before starting this medication. A reliable  form of contraception is recommended while taking this medication and for 5 months after the last dose. Talk to your care team about effective forms of contraception. Do not breast-feed while taking this medication and for 5 months after the last dose. What side effects may I notice from receiving this medication? Side effects that you should report to your care team as soon as possible: Allergic reactions--skin rash, itching, hives, swelling of the face, lips, tongue, or throat Dry cough, shortness of breath or trouble breathing Eye pain, redness, irritation, or discharge with blurry or decreased vision Heart muscle inflammation--unusual weakness or fatigue, shortness of breath, chest pain, fast or irregular heartbeat, dizziness, swelling of the ankles, feet, or hands Hormone gland problems--headache, sensitivity to light, unusual weakness or fatigue, dizziness, fast or irregular heartbeat, increased sensitivity to cold or heat, excessive sweating, constipation, hair loss, increased thirst or amount of urine, tremors or shaking, irritability Infusion reactions--chest pain, shortness of breath or trouble breathing, feeling faint or lightheaded Kidney injury (glomerulonephritis)--decrease in the amount of urine, red or dark brown urine, foamy or bubbly urine, swelling of the ankles, hands, or feet Liver injury--right upper belly pain, loss of appetite, nausea, light-colored stool, dark yellow or brown urine, yellowing skin or eyes, unusual weakness or fatigue Pain, tingling, or numbness in the hands  or feet, muscle weakness, change in vision, confusion or trouble speaking, loss of balance or coordination, trouble walking, seizures Rash, fever, and swollen lymph nodes Redness, blistering, peeling, or loosening of the skin, including inside the mouth Sudden or severe stomach pain, bloody diarrhea, fever, nausea, vomiting Side effects that usually do not require medical attention (report these to your  care team if they continue or are bothersome): Bone, joint, or muscle pain Diarrhea Fatigue Loss of appetite Nausea Skin rash This list may not describe all possible side effects. Call your doctor for medical advice about side effects. You may report side effects to FDA at 1-800-FDA-1088. Where should I keep my medication? This medication is given in a hospital or clinic. It will not be stored at home. NOTE: This sheet is a summary. It may not cover all possible information. If you have questions about this medicine, talk to your doctor, pharmacist, or health care provider.  2024 Elsevier/Gold Standard (2021-07-31 00:00:00) Paclitaxel Injection What is this medication? PACLITAXEL (PAK li TAX el) treats some types of cancer. It works by slowing down the growth of cancer cells. This medicine may be used for other purposes; ask your health care provider or pharmacist if you have questions. COMMON BRAND NAME(S): Onxol, Taxol What should I tell my care team before I take this medication? They need to know if you have any of these conditions: Heart disease Liver disease Low white blood cell levels An unusual or allergic reaction to paclitaxel, other medications, foods, dyes, or preservatives If you or your partner are pregnant or trying to get pregnant Breast-feeding How should I use this medication? This medication is injected into a vein. It is given by your care team in a hospital or clinic setting. Talk to your care team about the use of this medication in children. While it may be given to children for selected conditions, precautions do apply. Overdosage: If you think you have taken too much of this medicine contact a poison control center or emergency room at once. NOTE: This medicine is only for you. Do not share this medicine with others. What if I miss a dose? Keep appointments for follow-up doses. It is important not to miss your dose. Call your care team if you are unable to  keep an appointment. What may interact with this medication? Do not take this medication with any of the following: Live virus vaccines Other medications may affect the way this medication works. Talk with your care team about all of the medications you take. They may suggest changes to your treatment plan to lower the risk of side effects and to make sure your medications work as intended. This list may not describe all possible interactions. Give your health care provider a list of all the medicines, herbs, non-prescription drugs, or dietary supplements you use. Also tell them if you smoke, drink alcohol, or use illegal drugs. Some items may interact with your medicine. What should I watch for while using this medication? Your condition will be monitored carefully while you are receiving this medication. You may need blood work while taking this medication. This medication may make you feel generally unwell. This is not uncommon as chemotherapy can affect healthy cells as well as cancer cells. Report any side effects. Continue your course of treatment even though you feel ill unless your care team tells you to stop. This medication can cause serious allergic reactions. To reduce the risk, your care team may give you other medications to take  before receiving this one. Be sure to follow the directions from your care team. This medication may increase your risk of getting an infection. Call your care team for advice if you get a fever, chills, sore throat, or other symptoms of a cold or flu. Do not treat yourself. Try to avoid being around people who are sick. This medication may increase your risk to bruise or bleed. Call your care team if you notice any unusual bleeding. Be careful brushing or flossing your teeth or using a toothpick because you may get an infection or bleed more easily. If you have any dental work done, tell your dentist you are receiving this medication. Talk to your care team if  you may be pregnant. Serious birth defects can occur if you take this medication during pregnancy. Talk to your care team before breastfeeding. Changes to your treatment plan may be needed. What side effects may I notice from receiving this medication? Side effects that you should report to your care team as soon as possible: Allergic reactions--skin rash, itching, hives, swelling of the face, lips, tongue, or throat Heart rhythm changes--fast or irregular heartbeat, dizziness, feeling faint or lightheaded, chest pain, trouble breathing Increase in blood pressure Infection--fever, chills, cough, sore throat, wounds that don't heal, pain or trouble when passing urine, general feeling of discomfort or being unwell Low blood pressure--dizziness, feeling faint or lightheaded, blurry vision Low red blood cell level--unusual weakness or fatigue, dizziness, headache, trouble breathing Painful swelling, warmth, or redness of the skin, blisters or sores at the infusion site Pain, tingling, or numbness in the hands or feet Slow heartbeat--dizziness, feeling faint or lightheaded, confusion, trouble breathing, unusual weakness or fatigue Unusual bruising or bleeding Side effects that usually do not require medical attention (report to your care team if they continue or are bothersome): Diarrhea Hair loss Joint pain Loss of appetite Muscle pain Nausea Vomiting This list may not describe all possible side effects. Call your doctor for medical advice about side effects. You may report side effects to FDA at 1-800-FDA-1088. Where should I keep my medication? This medication is given in a hospital or clinic. It will not be stored at home. NOTE: This sheet is a summary. It may not cover all possible information. If you have questions about this medicine, talk to your doctor, pharmacist, or health care provider.  2024 Elsevier/Gold Standard (2021-08-22 00:00:00)  Carboplatin Injection What is this  medication? CARBOPLATIN (KAR boe pla tin) treats some types of cancer. It works by slowing down the growth of cancer cells. This medicine may be used for other purposes; ask your health care provider or pharmacist if you have questions. COMMON BRAND NAME(S): Paraplatin What should I tell my care team before I take this medication? They need to know if you have any of these conditions: Blood disorders Hearing problems Kidney disease Recent or ongoing radiation therapy An unusual or allergic reaction to carboplatin, cisplatin, other medications, foods, dyes, or preservatives Pregnant or trying to get pregnant Breast-feeding How should I use this medication? This medication is injected into a vein. It is given by your care team in a hospital or clinic setting. Talk to your care team about the use of this medication in children. Special care may be needed. Overdosage: If you think you have taken too much of this medicine contact a poison control center or emergency room at once. NOTE: This medicine is only for you. Do not share this medicine with others. What if I miss a  dose? Keep appointments for follow-up doses. It is important not to miss your dose. Call your care team if you are unable to keep an appointment. What may interact with this medication? Medications for seizures Some antibiotics, such as amikacin, gentamicin, neomycin, streptomycin, tobramycin Vaccines This list may not describe all possible interactions. Give your health care provider a list of all the medicines, herbs, non-prescription drugs, or dietary supplements you use. Also tell them if you smoke, drink alcohol, or use illegal drugs. Some items may interact with your medicine. What should I watch for while using this medication? Your condition will be monitored carefully while you are receiving this medication. You may need blood work while taking this medication. This medication may make you feel generally unwell. This  is not uncommon, as chemotherapy can affect healthy cells as well as cancer cells. Report any side effects. Continue your course of treatment even though you feel ill unless your care team tells you to stop. In some cases, you may be given additional medications to help with side effects. Follow all directions for their use. This medication may increase your risk of getting an infection. Call your care team for advice if you get a fever, chills, sore throat, or other symptoms of a cold or flu. Do not treat yourself. Try to avoid being around people who are sick. Avoid taking medications that contain aspirin, acetaminophen, ibuprofen, naproxen, or ketoprofen unless instructed by your care team. These medications may hide a fever. Be careful brushing or flossing your teeth or using a toothpick because you may get an infection or bleed more easily. If you have any dental work done, tell your dentist you are receiving this medication. Talk to your care team if you wish to become pregnant or think you might be pregnant. This medication can cause serious birth defects. Talk to your care team about effective forms of contraception. Do not breast-feed while taking this medication. What side effects may I notice from receiving this medication? Side effects that you should report to your care team as soon as possible: Allergic reactions--skin rash, itching, hives, swelling of the face, lips, tongue, or throat Infection--fever, chills, cough, sore throat, wounds that don't heal, pain or trouble when passing urine, general feeling of discomfort or being unwell Low red blood cell level--unusual weakness or fatigue, dizziness, headache, trouble breathing Pain, tingling, or numbness in the hands or feet, muscle weakness, change in vision, confusion or trouble speaking, loss of balance or coordination, trouble walking, seizures Unusual bruising or bleeding Side effects that usually do not require medical attention  (report to your care team if they continue or are bothersome): Hair loss Nausea Unusual weakness or fatigue Vomiting This list may not describe all possible side effects. Call your doctor for medical advice about side effects. You may report side effects to FDA at 1-800-FDA-1088. Where should I keep my medication? This medication is given in a hospital or clinic. It will not be stored at home. NOTE: This sheet is a summary. It may not cover all possible information. If you have questions about this medicine, talk to your doctor, pharmacist, or health care provider.  2024 Elsevier/Gold Standard (2021-07-25 00:00:00)

## 2022-11-22 ENCOUNTER — Encounter: Payer: Self-pay | Admitting: Internal Medicine

## 2022-11-22 ENCOUNTER — Telehealth: Payer: Self-pay

## 2022-11-22 NOTE — Telephone Encounter (Signed)
-----   Message from Nurse Billey Chang sent at 11/21/2022  6:07 PM EDT ----- Regarding: 1st time Taxol/Carbo. Dr. Arbutus Ped patient Hi, [name] received his first Taxol/Carbo infusion today. He tolerated it well. Please follow-up with him. Thank you!

## 2022-11-22 NOTE — Telephone Encounter (Signed)
Spoke with wife Inetta Fermo. She  states that [name] is doing fine. He is eating, drinking, and urinating well. They know to call the office at [phone] if he has any questions or concerns.

## 2022-11-23 ENCOUNTER — Inpatient Hospital Stay: Payer: Medicare PPO

## 2022-11-23 ENCOUNTER — Other Ambulatory Visit: Payer: Self-pay

## 2022-11-23 VITALS — BP 120/70 | HR 74 | Resp 17

## 2022-11-23 DIAGNOSIS — Z79899 Other long term (current) drug therapy: Secondary | ICD-10-CM | POA: Diagnosis not present

## 2022-11-23 DIAGNOSIS — Z5111 Encounter for antineoplastic chemotherapy: Secondary | ICD-10-CM | POA: Diagnosis not present

## 2022-11-23 DIAGNOSIS — C3411 Malignant neoplasm of upper lobe, right bronchus or lung: Secondary | ICD-10-CM | POA: Diagnosis not present

## 2022-11-23 DIAGNOSIS — C3491 Malignant neoplasm of unspecified part of right bronchus or lung: Secondary | ICD-10-CM

## 2022-11-23 MED ORDER — PEGFILGRASTIM-CBQV 6 MG/0.6ML ~~LOC~~ SOSY
6.0000 mg | PREFILLED_SYRINGE | Freq: Once | SUBCUTANEOUS | Status: AC
  Administered 2022-11-23: 6 mg via SUBCUTANEOUS

## 2022-11-23 NOTE — Patient Instructions (Signed)

## 2022-11-28 ENCOUNTER — Inpatient Hospital Stay: Payer: Medicare PPO

## 2022-11-28 ENCOUNTER — Other Ambulatory Visit: Payer: Self-pay

## 2022-11-28 ENCOUNTER — Inpatient Hospital Stay (HOSPITAL_BASED_OUTPATIENT_CLINIC_OR_DEPARTMENT_OTHER): Payer: Medicare PPO | Admitting: Internal Medicine

## 2022-11-28 VITALS — BP 137/82 | HR 89 | Temp 98.3°F | Resp 17 | Ht 70.0 in | Wt 216.0 lb

## 2022-11-28 DIAGNOSIS — C3491 Malignant neoplasm of unspecified part of right bronchus or lung: Secondary | ICD-10-CM

## 2022-11-28 DIAGNOSIS — C3411 Malignant neoplasm of upper lobe, right bronchus or lung: Secondary | ICD-10-CM | POA: Diagnosis not present

## 2022-11-28 DIAGNOSIS — Z79899 Other long term (current) drug therapy: Secondary | ICD-10-CM | POA: Diagnosis not present

## 2022-11-28 DIAGNOSIS — Z5111 Encounter for antineoplastic chemotherapy: Secondary | ICD-10-CM | POA: Diagnosis not present

## 2022-11-28 LAB — CBC WITH DIFFERENTIAL (CANCER CENTER ONLY)
Abs Immature Granulocytes: 0.54 10*3/uL — ABNORMAL HIGH (ref 0.00–0.07)
Basophils Absolute: 0.1 10*3/uL (ref 0.0–0.1)
Basophils Relative: 1 %
Eosinophils Absolute: 0.4 10*3/uL (ref 0.0–0.5)
Eosinophils Relative: 4 %
HCT: 43.5 % (ref 39.0–52.0)
Hemoglobin: 15.3 g/dL (ref 13.0–17.0)
Immature Granulocytes: 5 %
Lymphocytes Relative: 29 %
Lymphs Abs: 3 10*3/uL (ref 0.7–4.0)
MCH: 31 pg (ref 26.0–34.0)
MCHC: 35.2 g/dL (ref 30.0–36.0)
MCV: 88.1 fL (ref 80.0–100.0)
Monocytes Absolute: 1.6 10*3/uL — ABNORMAL HIGH (ref 0.1–1.0)
Monocytes Relative: 16 %
Neutro Abs: 4.7 10*3/uL (ref 1.7–7.7)
Neutrophils Relative %: 45 %
Platelet Count: 178 10*3/uL (ref 150–400)
RBC: 4.94 MIL/uL (ref 4.22–5.81)
RDW: 13 % (ref 11.5–15.5)
Smear Review: NORMAL
WBC Count: 10.3 10*3/uL (ref 4.0–10.5)
nRBC: 0 % (ref 0.0–0.2)

## 2022-11-28 LAB — CMP (CANCER CENTER ONLY)
ALT: 24 U/L (ref 0–44)
AST: 16 U/L (ref 15–41)
Albumin: 4.5 g/dL (ref 3.5–5.0)
Alkaline Phosphatase: 73 U/L (ref 38–126)
Anion gap: 7 (ref 5–15)
BUN: 18 mg/dL (ref 8–23)
CO2: 26 mmol/L (ref 22–32)
Calcium: 9.4 mg/dL (ref 8.9–10.3)
Chloride: 105 mmol/L (ref 98–111)
Creatinine: 0.88 mg/dL (ref 0.61–1.24)
GFR, Estimated: >60 mL/min (ref >60)
Glucose, Bld: 99 mg/dL (ref 70–99)
Potassium: 4.2 mmol/L (ref 3.5–5.1)
Sodium: 138 mmol/L (ref 135–145)
Total Bilirubin: 0.4 mg/dL (ref 0.3–1.2)
Total Protein: 7.4 g/dL (ref 6.5–8.1)

## 2022-11-28 NOTE — Progress Notes (Signed)
[facility] [phone]   [phone]  OFFICE PROGRESS NOTE  [name], [place] [address] [place] [address]  DIAGNOSIS: Stage IIb (T1c, N1, M0) non-small cell lung cancer, poorly differentiated with sarcomatoid features.  PRIOR THERAPY: None   CURRENT THERAPY: Neoadjuvant chemoimmunotherapy with carboplatin for AUC of 6, paclitaxel 200 Mg/M2 and nivolumab 360 mg IV every 3 weeks with Neulasta support.  First dose 11/21/2022.  Status post 1 cycle.  INTERVAL HISTORY: Brandon Schultz 72 y.o. male returns to the clinic today for follow-up visit accompanied by his wife and sister Dondra Spry.  The patient is feeling fine today with no concerning complaints.  He tolerated the first dose of his treatment with systemic chemotherapy last week fairly well.  He denied having any nausea, vomiting, diarrhea or constipation.  He has no headache or visual changes.  He denied having any recent weight loss or night sweats.  Has some mild arthralgia of the left knee.  He is here today for evaluation and repeat blood work.  MEDICAL HISTORY: Past Medical History:  Diagnosis Date   Allergy    seasonal   Arthritis    Asthma    Cataract    [employer] 05/31/2020   Cervical stenosis of spine    Chronic kidney disease    [employer] 05/31/2020   Colon polyps    Colon polyps    Complication of anesthesia    COPD (chronic obstructive pulmonary disease) (HCC)    Diverticulitis    Emphysema    Emphysema of lung (HCC)    [employer] 05/31/2020   GERD (gastroesophageal reflux disease)    Glaucoma    [employer]ia 05/31/2020   Gum disease    H/O degenerative disc disease    Hemorrhoids    Hyperlipidemia    [employer] 05/31/2020   Inguinal hernia    Melanoma (HCC)    facial   PONV (postoperative nausea and vomiting)    Renal disease    Bright's Disease-childhood   Syncope    Umbilical hernia    Urinary tract infection     ALLERGIES:  has No Known  Allergies.  MEDICATIONS:  Current Outpatient Medications  Medication Sig Dispense Refill   ibuprofen (ADVIL) 200 MG tablet Take 200 mg by mouth every 6 (six) hours as needed.     ondansetron (ZOFRAN) 8 MG tablet Take 1 tablet (8 mg total) by mouth every 8 (eight) hours as needed for nausea or vomiting. Start on the third day after carboplatin. 30 tablet 1   pantoprazole (PROTONIX) 40 MG tablet Take 1 tablet (40 mg total) by mouth 2 (two) times daily. 180 tablet 4   prochlorperazine (COMPAZINE) 10 MG tablet Take 1 tablet (10 mg total) by mouth every 6 (six) hours as needed for nausea or vomiting. 30 tablet 0   rosuvastatin (CRESTOR) 20 MG tablet Take 1 tablet (20 mg total) by mouth every evening.     timolol (TIMOPTIC) 0.5 % ophthalmic solution Place 1 drop into the left eye every morning.     No current facility-administered medications for this visit.    SURGICAL HISTORY:  Past Surgical History:  Procedure Laterality Date   BRONCHIAL BIOPSY  09/25/2022   Procedure: BRONCHIAL BIOPSIES;  Surgeon: Leslye Peer, MD;  Location: [facility] ENDOSCOPY;  Service: Pulmonary;;   BRONCHIAL BRUSHINGS  09/25/2022   Procedure: BRONCHIAL BRUSHINGS;  Surgeon: Leslye Peer, MD;  Location: [facility] ENDOSCOPY;  Service: Pulmonary;;  BRONCHIAL NEEDLE ASPIRATION BIOPSY  09/25/2022   Procedure: BRONCHIAL NEEDLE ASPIRATION BIOPSIES;  Surgeon: Leslye Peer, MD;  Location: [facility] ENDOSCOPY;  Service: Pulmonary;;   cataract surgery Bilateral 11/2020   COLONOSCOPY  06/08/2021   07/27/2016   DENTAL SURGERY     FIDUCIAL MARKER PLACEMENT  09/25/2022   Procedure: FIDUCIAL MARKER PLACEMENT;  Surgeon: Leslye Peer, MD;  Location: [facility] ENDOSCOPY;  Service: Pulmonary;;   HERNIA REPAIR     Umbilical & Inguinal   left knee surgery     MELANOMA EXCISION     facial   neck and shoulder injection     POLYPECTOMY     WISDOM TOOTH EXTRACTION      REVIEW OF SYSTEMS:  A comprehensive review of systems was negative except for:  Musculoskeletal: positive for arthralgias   PHYSICAL EXAMINATION: General appearance: alert, cooperative, and no distress Head: Normocephalic, without obvious abnormality, atraumatic Neck: no adenopathy, no JVD, supple, symmetrical, trachea midline, and thyroid not enlarged, symmetric, no tenderness/mass/nodules Lymph nodes: Cervical, supraclavicular, and axillary nodes normal. Resp: clear to auscultation bilaterally Back: symmetric, no curvature. ROM normal. No CVA tenderness. Cardio: regular rate and rhythm, S1, S2 normal, no murmur, click, rub or gallop GI: soft, non-tender; bowel sounds normal; no masses,  no organomegaly Extremities: extremities normal, atraumatic, no cyanosis or edema  ECOG PERFORMANCE STATUS: 1 - Symptomatic but completely ambulatory  Blood pressure 137/82, pulse 89, temperature 98.3 F (36.8 C), temperature source Oral, resp. rate 17, height 5\' 10"  (1.778 m), weight 216 lb (98 kg), SpO2 97%.  LABORATORY DATA: Lab Results  Component Value Date   WBC 10.3 11/28/2022   HGB 15.3 11/28/2022   HCT 43.5 11/28/2022   MCV 88.1 11/28/2022   PLT 178 11/28/2022      Chemistry      Component Value Date/Time   NA 138 11/28/2022 1444   NA 140 06/14/2022 0904   K 4.2 11/28/2022 1444   CL 105 11/28/2022 1444   CO2 26 11/28/2022 1444   BUN 18 11/28/2022 1444   BUN 21 06/14/2022 0904   CREATININE 0.88 11/28/2022 1444   CREATININE 0.92 02/18/2013 1431      Component Value Date/Time   CALCIUM 9.4 11/28/2022 1444   ALKPHOS 73 11/28/2022 1444   AST 16 11/28/2022 1444   ALT 24 11/28/2022 1444   BILITOT 0.4 11/28/2022 1444       RADIOGRAPHIC STUDIES: No results found.  ASSESSMENT AND PLAN: This is a very pleasant 72 years old white male diagnosed with stage IIb (T1c, N1, M0) non-small cell lung cancer, poorly differentiated with sarcomatoid features presented with right upper lobe lung nodule in addition to right suprahilar nodal metastasis diagnosed in June  2024. The patient had previous imaging studies including MRI of the brain and PET scan that showed no other evidence of metastatic disease. He was seen by Dr. Dorris Fetch for surgical evaluation and he felt that the patient may benefit from a course of neoadjuvant chemoimmunotherapy first before consideration of surgical resection. He is currently undergoing a course of carboplatin for AUC of 6, paclitaxel 200 Mg/M2 and nivolumab 360 mg IV every 3 weeks with Neulasta support for 3 doses.  Status post 1 cycle. The patient tolerated the first week of his treatment fairly well with no concerning adverse effects. Repeat blood work today is unremarkable. I recommended for him to proceed with cycle #2 in 2 weeks as planned. He is planning to go to the beach this week and he  will get his blood work done locally next week at that area and faxed to [place]. He was advised to call immediately if he has any other concerning symptoms in the interval. The patient voices understanding of current disease status and treatment options and is in agreement with the current care plan.  All questions were answered. The patient knows to call the clinic with any problems, questions or concerns. We can certainly see the patient much sooner if necessary.  The total time spent in the appointment was 20 minutes.  Disclaimer: This note was dictated with voice recognition software. Similar sounding words can inadvertently be transcribed and may not be corrected upon review.

## 2022-11-30 ENCOUNTER — Other Ambulatory Visit: Payer: Self-pay | Admitting: Family Medicine

## 2022-11-30 DIAGNOSIS — E782 Mixed hyperlipidemia: Secondary | ICD-10-CM

## 2022-12-05 ENCOUNTER — Other Ambulatory Visit: Payer: Medicare PPO

## 2022-12-05 DIAGNOSIS — C3491 Malignant neoplasm of unspecified part of right bronchus or lung: Secondary | ICD-10-CM | POA: Diagnosis not present

## 2022-12-08 ENCOUNTER — Encounter: Payer: Self-pay | Admitting: Internal Medicine

## 2022-12-10 ENCOUNTER — Encounter: Payer: Self-pay | Admitting: Internal Medicine

## 2022-12-12 MED FILL — Dexamethasone Sodium Phosphate Inj 100 MG/10ML: INTRAMUSCULAR | Qty: 1 | Status: AC

## 2022-12-12 MED FILL — Fosaprepitant Dimeglumine For IV Infusion 150 MG (Base Eq): INTRAVENOUS | Qty: 5 | Status: AC

## 2022-12-13 ENCOUNTER — Inpatient Hospital Stay: Payer: Medicare PPO

## 2022-12-13 ENCOUNTER — Other Ambulatory Visit: Payer: Self-pay | Admitting: Medical Oncology

## 2022-12-13 ENCOUNTER — Inpatient Hospital Stay (HOSPITAL_BASED_OUTPATIENT_CLINIC_OR_DEPARTMENT_OTHER): Payer: Medicare PPO | Admitting: Internal Medicine

## 2022-12-13 DIAGNOSIS — C3491 Malignant neoplasm of unspecified part of right bronchus or lung: Secondary | ICD-10-CM

## 2022-12-13 DIAGNOSIS — C3411 Malignant neoplasm of upper lobe, right bronchus or lung: Secondary | ICD-10-CM | POA: Diagnosis not present

## 2022-12-13 DIAGNOSIS — Z79899 Other long term (current) drug therapy: Secondary | ICD-10-CM | POA: Diagnosis not present

## 2022-12-13 DIAGNOSIS — Z5111 Encounter for antineoplastic chemotherapy: Secondary | ICD-10-CM | POA: Diagnosis not present

## 2022-12-13 LAB — CBC WITH DIFFERENTIAL (CANCER CENTER ONLY)
Abs Immature Granulocytes: 0.03 10*3/uL (ref 0.00–0.07)
Basophils Absolute: 0.1 10*3/uL (ref 0.0–0.1)
Basophils Relative: 1 %
Eosinophils Absolute: 0.1 10*3/uL (ref 0.0–0.5)
Eosinophils Relative: 1 %
HCT: 40.9 % (ref 39.0–52.0)
Hemoglobin: 14.2 g/dL (ref 13.0–17.0)
Immature Granulocytes: 0 %
Lymphocytes Relative: 28 %
Lymphs Abs: 2.4 10*3/uL (ref 0.7–4.0)
MCH: 30.3 pg (ref 26.0–34.0)
MCHC: 34.7 g/dL (ref 30.0–36.0)
MCV: 87.4 fL (ref 80.0–100.0)
Monocytes Absolute: 0.8 10*3/uL (ref 0.1–1.0)
Monocytes Relative: 9 %
Neutro Abs: 5.1 10*3/uL (ref 1.7–7.7)
Neutrophils Relative %: 61 %
Platelet Count: 351 10*3/uL (ref 150–400)
RBC: 4.68 MIL/uL (ref 4.22–5.81)
RDW: 14 % (ref 11.5–15.5)
WBC Count: 8.5 10*3/uL (ref 4.0–10.5)
nRBC: 0 % (ref 0.0–0.2)

## 2022-12-13 LAB — CMP (CANCER CENTER ONLY)
ALT: 17 U/L (ref 0–44)
AST: 15 U/L (ref 15–41)
Albumin: 4.4 g/dL (ref 3.5–5.0)
Alkaline Phosphatase: 53 U/L (ref 38–126)
Anion gap: 7 (ref 5–15)
BUN: 18 mg/dL (ref 8–23)
CO2: 24 mmol/L (ref 22–32)
Calcium: 9.4 mg/dL (ref 8.9–10.3)
Chloride: 109 mmol/L (ref 98–111)
Creatinine: 0.89 mg/dL (ref 0.61–1.24)
GFR, Estimated: >60 mL/min (ref >60)
Glucose, Bld: 115 mg/dL — ABNORMAL HIGH (ref 70–99)
Potassium: 3.9 mmol/L (ref 3.5–5.1)
Sodium: 140 mmol/L (ref 135–145)
Total Bilirubin: 0.6 mg/dL (ref 0.3–1.2)
Total Protein: 7.2 g/dL (ref 6.5–8.1)

## 2022-12-13 MED ORDER — PALONOSETRON HCL INJECTION 0.25 MG/5ML
0.2500 mg | Freq: Once | INTRAVENOUS | Status: AC
  Administered 2022-12-13: 0.25 mg via INTRAVENOUS
  Filled 2022-12-13: qty 5

## 2022-12-13 MED ORDER — SODIUM CHLORIDE 0.9 % IV SOLN: Freq: Once | INTRAVENOUS | Status: AC

## 2022-12-13 MED ORDER — SODIUM CHLORIDE 0.9 % IV SOLN
150.0000 mg | Freq: Once | INTRAVENOUS | Status: AC
  Administered 2022-12-13: 150 mg via INTRAVENOUS
  Filled 2022-12-13: qty 150

## 2022-12-13 MED ORDER — DIPHENHYDRAMINE HCL 50 MG/ML IJ SOLN
50.0000 mg | Freq: Once | INTRAMUSCULAR | Status: AC
  Administered 2022-12-13: 50 mg via INTRAVENOUS
  Filled 2022-12-13: qty 1

## 2022-12-13 MED ORDER — SODIUM CHLORIDE 0.9% FLUSH: 10.0000 mL | INTRAVENOUS | Status: DC | PRN

## 2022-12-13 MED ORDER — SODIUM CHLORIDE 0.9 % IV SOLN
360.0000 mg | Freq: Once | INTRAVENOUS | Status: AC
  Administered 2022-12-13: 360 mg via INTRAVENOUS
  Filled 2022-12-13: qty 24

## 2022-12-13 MED ORDER — HEPARIN SOD (PORK) LOCK FLUSH 100 UNIT/ML IV SOLN: 500.0000 [IU] | Freq: Once | INTRAVENOUS | Status: DC | PRN

## 2022-12-13 MED ORDER — FAMOTIDINE IN NACL 20-0.9 MG/50ML-% IV SOLN
20.0000 mg | Freq: Once | INTRAVENOUS | Status: AC
  Administered 2022-12-13: 20 mg via INTRAVENOUS
  Filled 2022-12-13: qty 50

## 2022-12-13 MED ORDER — SODIUM CHLORIDE 0.9 % IV SOLN
702.6000 mg | Freq: Once | INTRAVENOUS | Status: AC
  Administered 2022-12-13: 700 mg via INTRAVENOUS
  Filled 2022-12-13: qty 70

## 2022-12-13 MED ORDER — SODIUM CHLORIDE 0.9 % IV SOLN
200.0000 mg/m2 | Freq: Once | INTRAVENOUS | Status: AC
  Administered 2022-12-13: 438 mg via INTRAVENOUS
  Filled 2022-12-13: qty 73

## 2022-12-13 MED ORDER — SODIUM CHLORIDE 0.9 % IV SOLN
10.0000 mg | Freq: Once | INTRAVENOUS | Status: AC
  Administered 2022-12-13: 10 mg via INTRAVENOUS
  Filled 2022-12-13: qty 10

## 2022-12-13 NOTE — Patient Instructions (Signed)
 [facility]  Discharge Instructions: Thank you for choosing [facility] to provide your oncology and hematology care.   If you have a lab appointment with the Cancer Center, please go directly to the Cancer Center and check in at the registration area.   Wear comfortable clothing and clothing appropriate for easy access to any Portacath or PICC line.   We strive to give you quality time with your provider. You may need to reschedule your appointment if you arrive late (15 or more minutes).  Arriving late affects you and other patients whose appointments are after yours.  Also, if you miss three or more appointments without notifying the office, you may be dismissed from the clinic at the provider's discretion.      For prescription refill requests, have your pharmacy contact our office and allow 72 hours for refills to be completed.    Today you received the following chemotherapy and/or immunotherapy agents Nivolumab, Paclitaxel, Carboplatin      To help prevent nausea and vomiting after your treatment, we encourage you to take your nausea medication as directed.  BELOW ARE SYMPTOMS THAT SHOULD BE REPORTED IMMEDIATELY: *FEVER GREATER THAN 100.4 F (38 C) OR HIGHER *CHILLS OR SWEATING *NAUSEA AND VOMITING THAT IS NOT CONTROLLED WITH YOUR NAUSEA MEDICATION *UNUSUAL SHORTNESS OF BREATH *UNUSUAL BRUISING OR BLEEDING *URINARY PROBLEMS (pain or burning when urinating, or frequent urination) *BOWEL PROBLEMS (unusual diarrhea, constipation, pain near the anus) TENDERNESS IN MOUTH AND THROAT WITH OR WITHOUT PRESENCE OF ULCERS (sore throat, sores in mouth, or a toothache) UNUSUAL RASH, SWELLING OR PAIN  UNUSUAL VAGINAL DISCHARGE OR ITCHING   Items with * indicate a potential emergency and should be followed up as soon as possible or go to the Emergency Department if any problems should occur.  Please show the CHEMOTHERAPY ALERT CARD or  IMMUNOTHERAPY ALERT CARD at check-in to the Emergency Department and triage nurse.  Should you have questions after your visit or need to cancel or reschedule your appointment, please contact [facility]  Dept: [phone]  and follow the prompts.  Office hours are 8:00 a.m. to 4:30 p.m. Monday - Friday. Please note that voicemails left after 4:00 p.m. may not be returned until the following business day.  We are closed weekends and major holidays. You have access to a nurse at all times for urgent questions. Please call the main number to the clinic Dept: [phone] and follow the prompts.   For any non-urgent questions, you may also contact your provider using MyChart. We now offer e-Visits for anyone 39 and older to request care online for non-urgent symptoms. For details visit [email or web address].   Also download the MyChart app! Go to the app store, search "MyChart", open the app, select [facility], and log in with your MyChart username and password.  Nivolumab Injection What is this medication? NIVOLUMAB (nye VOL ue mab) treats some types of cancer. It works by helping your immune system slow or stop the spread of cancer cells. It is a monoclonal antibody. This medicine may be used for other purposes; ask your health care provider or pharmacist if you have questions. COMMON BRAND NAME(S): Opdivo What should I tell my care team before I take this medication? They need to know if you have any of these conditions: Allogeneic stem cell transplant (uses someone else's stem cells) Autoimmune diseases, such as Crohn disease, ulcerative colitis, lupus History of chest radiation  Nervous system problems, such as Guillain-Barre syndrome or myasthenia gravis Organ transplant An unusual or allergic reaction to nivolumab, other medications, foods, dyes, or preservatives Pregnant or trying to get pregnant Breast-feeding How should I use this  medication? This medication is infused into a vein. It is given in a hospital or clinic setting. A special MedGuide will be given to you before each treatment. Be sure to read this information carefully each time. Talk to your care team about the use of this medication in children. While it may be prescribed for children as young as 12 years for selected conditions, precautions do apply. Overdosage: If you think you have taken too much of this medicine contact a poison control center or emergency room at once. NOTE: This medicine is only for you. Do not share this medicine with others. What if I miss a dose? Keep appointments for follow-up doses. It is important not to miss your dose. Call your care team if you are unable to keep an appointment. What may interact with this medication? Interactions have not been studied. This list may not describe all possible interactions. Give your health care provider a list of all the medicines, herbs, non-prescription drugs, or dietary supplements you use. Also tell them if you smoke, drink alcohol, or use illegal drugs. Some items may interact with your medicine. What should I watch for while using this medication? Your condition will be monitored carefully while you are receiving this medication. You may need blood work while taking this medication. This medication may cause serious skin reactions. They can happen weeks to months after starting the medication. Contact your care team right away if you notice fevers or flu-like symptoms with a rash. The rash may be red or purple and then turn into blisters or peeling of the skin. You may also notice a red rash with swelling of the face, lips, or lymph nodes in your neck or under your arms. Tell your care team right away if you have any change in your eyesight. Talk to your care team if you are pregnant or think you might be pregnant. A negative pregnancy test is required before starting this medication. A reliable  form of contraception is recommended while taking this medication and for 5 months after the last dose. Talk to your care team about effective forms of contraception. Do not breast-feed while taking this medication and for 5 months after the last dose. What side effects may I notice from receiving this medication? Side effects that you should report to your care team as soon as possible: Allergic reactions--skin rash, itching, hives, swelling of the face, lips, tongue, or throat Dry cough, shortness of breath or trouble breathing Eye pain, redness, irritation, or discharge with blurry or decreased vision Heart muscle inflammation--unusual weakness or fatigue, shortness of breath, chest pain, fast or irregular heartbeat, dizziness, swelling of the ankles, feet, or hands Hormone gland problems--headache, sensitivity to light, unusual weakness or fatigue, dizziness, fast or irregular heartbeat, increased sensitivity to cold or heat, excessive sweating, constipation, hair loss, increased thirst or amount of urine, tremors or shaking, irritability Infusion reactions--chest pain, shortness of breath or trouble breathing, feeling faint or lightheaded Kidney injury (glomerulonephritis)--decrease in the amount of urine, red or dark brown urine, foamy or bubbly urine, swelling of the ankles, hands, or feet Liver injury--right upper belly pain, loss of appetite, nausea, light-colored stool, dark yellow or brown urine, yellowing skin or eyes, unusual weakness or fatigue Pain, tingling, or numbness in the hands  or feet, muscle weakness, change in vision, confusion or trouble speaking, loss of balance or coordination, trouble walking, seizures Rash, fever, and swollen lymph nodes Redness, blistering, peeling, or loosening of the skin, including inside the mouth Sudden or severe stomach pain, bloody diarrhea, fever, nausea, vomiting Side effects that usually do not require medical attention (report these to your  care team if they continue or are bothersome): Bone, joint, or muscle pain Diarrhea Fatigue Loss of appetite Nausea Skin rash This list may not describe all possible side effects. Call your doctor for medical advice about side effects. You may report side effects to FDA at 1-800-FDA-1088. Where should I keep my medication? This medication is given in a hospital or clinic. It will not be stored at home. NOTE: This sheet is a summary. It may not cover all possible information. If you have questions about this medicine, talk to your doctor, pharmacist, or health care provider.  2024 Elsevier/Gold Standard (2021-07-31 00:00:00) Paclitaxel Injection What is this medication? PACLITAXEL (PAK li TAX el) treats some types of cancer. It works by slowing down the growth of cancer cells. This medicine may be used for other purposes; ask your health care provider or pharmacist if you have questions. COMMON BRAND NAME(S): Onxol, Taxol What should I tell my care team before I take this medication? They need to know if you have any of these conditions: Heart disease Liver disease Low white blood cell levels An unusual or allergic reaction to paclitaxel, other medications, foods, dyes, or preservatives If you or your partner are pregnant or trying to get pregnant Breast-feeding How should I use this medication? This medication is injected into a vein. It is given by your care team in a hospital or clinic setting. Talk to your care team about the use of this medication in children. While it may be given to children for selected conditions, precautions do apply. Overdosage: If you think you have taken too much of this medicine contact a poison control center or emergency room at once. NOTE: This medicine is only for you. Do not share this medicine with others. What if I miss a dose? Keep appointments for follow-up doses. It is important not to miss your dose. Call your care team if you are unable to  keep an appointment. What may interact with this medication? Do not take this medication with any of the following: Live virus vaccines Other medications may affect the way this medication works. Talk with your care team about all of the medications you take. They may suggest changes to your treatment plan to lower the risk of side effects and to make sure your medications work as intended. This list may not describe all possible interactions. Give your health care provider a list of all the medicines, herbs, non-prescription drugs, or dietary supplements you use. Also tell them if you smoke, drink alcohol, or use illegal drugs. Some items may interact with your medicine. What should I watch for while using this medication? Your condition will be monitored carefully while you are receiving this medication. You may need blood work while taking this medication. This medication may make you feel generally unwell. This is not uncommon as chemotherapy can affect healthy cells as well as cancer cells. Report any side effects. Continue your course of treatment even though you feel ill unless your care team tells you to stop. This medication can cause serious allergic reactions. To reduce the risk, your care team may give you other medications to take  before receiving this one. Be sure to follow the directions from your care team. This medication may increase your risk of getting an infection. Call your care team for advice if you get a fever, chills, sore throat, or other symptoms of a cold or flu. Do not treat yourself. Try to avoid being around people who are sick. This medication may increase your risk to bruise or bleed. Call your care team if you notice any unusual bleeding. Be careful brushing or flossing your teeth or using a toothpick because you may get an infection or bleed more easily. If you have any dental work done, tell your dentist you are receiving this medication. Talk to your care team if  you may be pregnant. Serious birth defects can occur if you take this medication during pregnancy. Talk to your care team before breastfeeding. Changes to your treatment plan may be needed. What side effects may I notice from receiving this medication? Side effects that you should report to your care team as soon as possible: Allergic reactions--skin rash, itching, hives, swelling of the face, lips, tongue, or throat Heart rhythm changes--fast or irregular heartbeat, dizziness, feeling faint or lightheaded, chest pain, trouble breathing Increase in blood pressure Infection--fever, chills, cough, sore throat, wounds that don't heal, pain or trouble when passing urine, general feeling of discomfort or being unwell Low blood pressure--dizziness, feeling faint or lightheaded, blurry vision Low red blood cell level--unusual weakness or fatigue, dizziness, headache, trouble breathing Painful swelling, warmth, or redness of the skin, blisters or sores at the infusion site Pain, tingling, or numbness in the hands or feet Slow heartbeat--dizziness, feeling faint or lightheaded, confusion, trouble breathing, unusual weakness or fatigue Unusual bruising or bleeding Side effects that usually do not require medical attention (report to your care team if they continue or are bothersome): Diarrhea Hair loss Joint pain Loss of appetite Muscle pain Nausea Vomiting This list may not describe all possible side effects. Call your doctor for medical advice about side effects. You may report side effects to FDA at 1-800-FDA-1088. Where should I keep my medication? This medication is given in a hospital or clinic. It will not be stored at home. NOTE: This sheet is a summary. It may not cover all possible information. If you have questions about this medicine, talk to your doctor, pharmacist, or health care provider.  2024 Elsevier/Gold Standard (2021-08-22 00:00:00)  Carboplatin Injection What is this  medication? CARBOPLATIN (KAR boe pla tin) treats some types of cancer. It works by slowing down the growth of cancer cells. This medicine may be used for other purposes; ask your health care provider or pharmacist if you have questions. COMMON BRAND NAME(S): Paraplatin What should I tell my care team before I take this medication? They need to know if you have any of these conditions: Blood disorders Hearing problems Kidney disease Recent or ongoing radiation therapy An unusual or allergic reaction to carboplatin, cisplatin, other medications, foods, dyes, or preservatives Pregnant or trying to get pregnant Breast-feeding How should I use this medication? This medication is injected into a vein. It is given by your care team in a hospital or clinic setting. Talk to your care team about the use of this medication in children. Special care may be needed. Overdosage: If you think you have taken too much of this medicine contact a poison control center or emergency room at once. NOTE: This medicine is only for you. Do not share this medicine with others. What if I miss a  dose? Keep appointments for follow-up doses. It is important not to miss your dose. Call your care team if you are unable to keep an appointment. What may interact with this medication? Medications for seizures Some antibiotics, such as amikacin, gentamicin, neomycin, streptomycin, tobramycin Vaccines This list may not describe all possible interactions. Give your health care provider a list of all the medicines, herbs, non-prescription drugs, or dietary supplements you use. Also tell them if you smoke, drink alcohol, or use illegal drugs. Some items may interact with your medicine. What should I watch for while using this medication? Your condition will be monitored carefully while you are receiving this medication. You may need blood work while taking this medication. This medication may make you feel generally unwell. This  is not uncommon, as chemotherapy can affect healthy cells as well as cancer cells. Report any side effects. Continue your course of treatment even though you feel ill unless your care team tells you to stop. In some cases, you may be given additional medications to help with side effects. Follow all directions for their use. This medication may increase your risk of getting an infection. Call your care team for advice if you get a fever, chills, sore throat, or other symptoms of a cold or flu. Do not treat yourself. Try to avoid being around people who are sick. Avoid taking medications that contain aspirin, acetaminophen, ibuprofen, naproxen, or ketoprofen unless instructed by your care team. These medications may hide a fever. Be careful brushing or flossing your teeth or using a toothpick because you may get an infection or bleed more easily. If you have any dental work done, tell your dentist you are receiving this medication. Talk to your care team if you wish to become pregnant or think you might be pregnant. This medication can cause serious birth defects. Talk to your care team about effective forms of contraception. Do not breast-feed while taking this medication. What side effects may I notice from receiving this medication? Side effects that you should report to your care team as soon as possible: Allergic reactions--skin rash, itching, hives, swelling of the face, lips, tongue, or throat Infection--fever, chills, cough, sore throat, wounds that don't heal, pain or trouble when passing urine, general feeling of discomfort or being unwell Low red blood cell level--unusual weakness or fatigue, dizziness, headache, trouble breathing Pain, tingling, or numbness in the hands or feet, muscle weakness, change in vision, confusion or trouble speaking, loss of balance or coordination, trouble walking, seizures Unusual bruising or bleeding Side effects that usually do not require medical attention  (report to your care team if they continue or are bothersome): Hair loss Nausea Unusual weakness or fatigue Vomiting This list may not describe all possible side effects. Call your doctor for medical advice about side effects. You may report side effects to FDA at 1-800-FDA-1088. Where should I keep my medication? This medication is given in a hospital or clinic. It will not be stored at home. NOTE: This sheet is a summary. It may not cover all possible information. If you have questions about this medicine, talk to your doctor, pharmacist, or health care provider.  2024 Elsevier/Gold Standard (2021-07-25 00:00:00)

## 2022-12-13 NOTE — Progress Notes (Signed)
[facility] Telephone:([phone]) [phone]   [phone]  OFFICE PROGRESS NOTE  [name], PA [address] [place] [address]  DIAGNOSIS: Stage IIb (T1c, N1, M0) non-small cell lung cancer, poorly differentiated with sarcomatoid features.  PRIOR THERAPY: None   CURRENT THERAPY: Neoadjuvant chemoimmunotherapy with carboplatin for AUC of 6, paclitaxel 200 Mg/M2 and nivolumab 360 mg IV every 3 weeks with Neulasta support.  First dose 11/21/2022.  Status post 1 cycle.  INTERVAL HISTORY: Brandon Schultz 72 y.o. male returns to the clinic today for follow-up visit accompanied by his wife.  The patient is feeling fine today with no concerning complaints.  He tolerated the first cycle of his treatment fairly well except for the aching pain and arthralgia after the Neulasta injection.  He denied having any current chest pain, shortness of breath, cough or hemoptysis.  He has no nausea, vomiting, diarrhea or constipation.  He has no headache or visual changes.  He has no recent weight loss or night sweats.  He is here today for evaluation before starting cycle #2.  MEDICAL HISTORY: Past Medical History:  Diagnosis Date   Allergy    seasonal   Arthritis    Asthma    Cataract    Phreesia 05/31/2020   Cervical stenosis of spine    Chronic kidney disease    Phreesia 05/31/2020   Colon polyps    Colon polyps    Complication of anesthesia    COPD (chronic obstructive pulmonary disease) (HCC)    Diverticulitis    Emphysema    Emphysema of lung (HCC)    Phreesia 05/31/2020   GERD (gastroesophageal reflux disease)    Glaucoma    Phreesia 05/31/2020   Gum disease    H/O degenerative disc disease    Hemorrhoids    Hyperlipidemia    Phreesia 05/31/2020   Inguinal hernia    Melanoma (HCC)    facial   PONV (postoperative nausea and vomiting)    Renal disease    Bright's Disease-childhood   Syncope    Umbilical hernia    Urinary tract infection      ALLERGIES:  has No Known Allergies.  MEDICATIONS:  Current Outpatient Medications  Medication Sig Dispense Refill   ondansetron (ZOFRAN) 8 MG tablet Take 1 tablet (8 mg total) by mouth every 8 (eight) hours as needed for nausea or vomiting. Start on the third day after carboplatin. 30 tablet 1   pantoprazole (PROTONIX) 40 MG tablet Take 1 tablet (40 mg total) by mouth 2 (two) times daily. 180 tablet 4   prochlorperazine (COMPAZINE) 10 MG tablet Take 1 tablet (10 mg total) by mouth every 6 (six) hours as needed for nausea or vomiting. 30 tablet 0   rosuvastatin (CRESTOR) 20 MG tablet TAKE ONE TABLET BY MOUTH EVERY DAY 90 tablet 1   timolol (TIMOPTIC) 0.5 % ophthalmic solution Place 1 drop into the left eye every morning.     No current facility-administered medications for this visit.    SURGICAL HISTORY:  Past Surgical History:  Procedure Laterality Date   BRONCHIAL BIOPSY  09/25/2022   Procedure: BRONCHIAL BIOPSIES;  Surgeon: Leslye Peer, MD;  Location: [facility] ENDOSCOPY;  Service: Pulmonary;;   BRONCHIAL BRUSHINGS  09/25/2022   Procedure: BRONCHIAL BRUSHINGS;  Surgeon: Leslye Peer, MD;  Location: [facility] ENDOSCOPY;  Service: Pulmonary;;   BRONCHIAL NEEDLE ASPIRATION BIOPSY  09/25/2022   Procedure: BRONCHIAL NEEDLE ASPIRATION BIOPSIES;  Surgeon: Delton Coombes,  Les Pou, MD;  Location: [facility] ENDOSCOPY;  Service: Pulmonary;;   cataract surgery Bilateral 11/2020   COLONOSCOPY  06/08/2021   07/27/2016   DENTAL SURGERY     FIDUCIAL MARKER PLACEMENT  09/25/2022   Procedure: FIDUCIAL MARKER PLACEMENT;  Surgeon: Leslye Peer, MD;  Location: [facility] ENDOSCOPY;  Service: Pulmonary;;   HERNIA REPAIR     Umbilical & Inguinal   left knee surgery     MELANOMA EXCISION     facial   neck and shoulder injection     POLYPECTOMY     WISDOM TOOTH EXTRACTION      REVIEW OF SYSTEMS:  A comprehensive review of systems was negative except for: Musculoskeletal: positive for arthralgias   PHYSICAL EXAMINATION:  General appearance: alert, cooperative, and no distress Head: Normocephalic, without obvious abnormality, atraumatic Neck: no adenopathy, no JVD, supple, symmetrical, trachea midline, and thyroid not enlarged, symmetric, no tenderness/mass/nodules Lymph nodes: Cervical, supraclavicular, and axillary nodes normal. Resp: clear to auscultation bilaterally Back: symmetric, no curvature. ROM normal. No CVA tenderness. Cardio: regular rate and rhythm, S1, S2 normal, no murmur, click, rub or gallop GI: soft, non-tender; bowel sounds normal; no masses,  no organomegaly Extremities: extremities normal, atraumatic, no cyanosis or edema  ECOG PERFORMANCE STATUS: 1 - Symptomatic but completely ambulatory  Blood pressure (!) 119/96, pulse 76, temperature 98.1 F (36.7 C), temperature source Oral, resp. rate 17, height 5\' 10"  (1.778 m), weight 217 lb (98.4 kg), SpO2 96%.  LABORATORY DATA: Lab Results  Component Value Date   WBC 8.5 12/13/2022   HGB 14.2 12/13/2022   HCT 40.9 12/13/2022   MCV 87.4 12/13/2022   PLT 351 12/13/2022      Chemistry      Component Value Date/Time   NA 140 12/13/2022 0931   NA 140 06/14/2022 0904   K 3.9 12/13/2022 0931   CL 109 12/13/2022 0931   CO2 24 12/13/2022 0931   BUN 18 12/13/2022 0931   BUN 21 06/14/2022 0904   CREATININE 0.89 12/13/2022 0931   CREATININE 0.92 02/18/2013 1431      Component Value Date/Time   CALCIUM 9.4 12/13/2022 0931   ALKPHOS 53 12/13/2022 0931   AST 15 12/13/2022 0931   ALT 17 12/13/2022 0931   BILITOT 0.6 12/13/2022 0931       RADIOGRAPHIC STUDIES: No results found.  ASSESSMENT AND PLAN: This is a very pleasant 72 years old white male diagnosed with stage IIb (T1c, N1, M0) non-small cell lung cancer, poorly differentiated with sarcomatoid features presented with right upper lobe lung nodule in addition to right suprahilar nodal metastasis diagnosed in June 2024. The patient had previous imaging studies including MRI of the  brain and PET scan that showed no other evidence of metastatic disease. He was seen by Dr. Dorris Fetch for surgical evaluation and he felt that the patient may benefit from a course of neoadjuvant chemoimmunotherapy first before consideration of surgical resection. He is currently undergoing a course of carboplatin for AUC of 6, paclitaxel 200 Mg/M2 and nivolumab 360 mg IV every 3 weeks with Neulasta support for 3 doses.  Status post 1 cycle. The patient tolerated the first cycle of his treatment well except for the aching pain and arthralgia from the Neulasta injection. His blood count is unremarkable today. I recommended for him to proceed with cycle #2 today as planned. The patient will come back for follow-up visit in 3 weeks for evaluation before the last cycle of his neoadjuvant treatment. He was advised  to call immediately if he has any other concerning symptoms in the interval. The patient voices understanding of current disease status and treatment options and is in agreement with the current care plan.  All questions were answered. The patient knows to call the clinic with any problems, questions or concerns. We can certainly see the patient much sooner if necessary.  The total time spent in the appointment was 20 minutes.  Disclaimer: This note was dictated with voice recognition software. Similar sounding words can inadvertently be transcribed and may not be corrected upon review.

## 2022-12-13 NOTE — Progress Notes (Signed)
Lab orders for weekly labs given to pt to take to [facility] .

## 2022-12-15 ENCOUNTER — Inpatient Hospital Stay: Payer: Medicare PPO

## 2022-12-15 ENCOUNTER — Other Ambulatory Visit: Payer: Self-pay

## 2022-12-15 VITALS — BP 145/75 | HR 79 | Temp 98.4°F | Resp 15

## 2022-12-15 DIAGNOSIS — Z79899 Other long term (current) drug therapy: Secondary | ICD-10-CM | POA: Diagnosis not present

## 2022-12-15 DIAGNOSIS — C3411 Malignant neoplasm of upper lobe, right bronchus or lung: Secondary | ICD-10-CM | POA: Diagnosis not present

## 2022-12-15 DIAGNOSIS — C3491 Malignant neoplasm of unspecified part of right bronchus or lung: Secondary | ICD-10-CM

## 2022-12-15 DIAGNOSIS — Z5111 Encounter for antineoplastic chemotherapy: Secondary | ICD-10-CM | POA: Diagnosis not present

## 2022-12-15 MED ORDER — PEGFILGRASTIM-CBQV 6 MG/0.6ML ~~LOC~~ SOSY
6.0000 mg | PREFILLED_SYRINGE | Freq: Once | SUBCUTANEOUS | Status: AC
  Administered 2022-12-15: 6 mg via SUBCUTANEOUS

## 2022-12-20 DIAGNOSIS — C3491 Malignant neoplasm of unspecified part of right bronchus or lung: Secondary | ICD-10-CM | POA: Diagnosis not present

## 2022-12-26 ENCOUNTER — Other Ambulatory Visit: Payer: Medicare PPO

## 2022-12-26 DIAGNOSIS — C3491 Malignant neoplasm of unspecified part of right bronchus or lung: Secondary | ICD-10-CM | POA: Diagnosis not present

## 2022-12-27 NOTE — Progress Notes (Unsigned)
[facility] OFFICE PROGRESS NOTE  [name], PA [address] [place] [address]  DIAGNOSIS: Stage IIb (T1c, N1, M0) non-small cell lung cancer, poorly differentiated with sarcomatoid features.   PRIOR THERAPY: None  CURRENT THERAPY: Neoadjuvant chemoimmunotherapy with carboplatin for AUC of 6, paclitaxel 200 Mg/M2 and nivolumab 360 mg IV every 3 weeks with Neulasta support.  First dose 11/21/2022.  Status post 2 cycles.   INTERVAL HISTORY: Brandon Schultz 72 y.o. male returns to the clinic today for a follow-up visit accompanied by his wife. The patient is feeling well today without any concerning complaints. He is currently undergoing neoadjuvant chemotherapy and immunotherapy and tolerating it well except for the aching pain secondary to Neulasta. They are wondering if he can get something stronger for pain management just for a few days following his injection of this cycle.  The patient has a history of knee pain and it exacerbates his underlying knee pain.  He goes to [place] and the intervals between his treatments and gets weekly lab work performed there.  He needs another printed copy of his weekly CBC and CMP orders.  Today he denies any fever, chills, night sweats, or unexplained weight loss except for 3 lbs. associates to his COPD and emphysema.  He states this is normal for him to have a mild cough and this is similar to his baseline.  He does not exert himself so he denies any significant dyspnea on exertion.  Denies any chest pain or hemoptysis.  Denies any nausea, vomiting, or diarrhea.  He takes laxatives if needed for constipation.  Denies any headache or visual changes.  He has a mild rash/skin lesion on his upper right chest which she is using hydrocortisone cream.  This itches mildly.  He is here today for evaluation and repeat blood work before undergoing cycle #3   MEDICAL HISTORY: Past Medical History:  Diagnosis Date   Allergy     seasonal   Arthritis    Asthma    Cataract    Phreesia 05/31/2020   Cervical stenosis of spine    Chronic kidney disease    Phreesia 05/31/2020   Colon polyps    Colon polyps    Complication of anesthesia    COPD (chronic obstructive pulmonary disease) (HCC)    Diverticulitis    Emphysema    Emphysema of lung (HCC)    Phreesia 05/31/2020   GERD (gastroesophageal reflux disease)    Glaucoma    Phreesia 05/31/2020   Gum disease    H/O degenerative disc disease    Hemorrhoids    Hyperlipidemia    Phreesia 05/31/2020   Inguinal hernia    Melanoma (HCC)    facial   PONV (postoperative nausea and vomiting)    Renal disease    Bright's Disease-childhood   Syncope    Umbilical hernia    Urinary tract infection     ALLERGIES:  has No Known Allergies.  MEDICATIONS:  Current Outpatient Medications  Medication Sig Dispense Refill   HYDROcodone-acetaminophen (NORCO) 5-325 MG tablet Take 1 tablet by mouth every 6 (six) hours as needed for moderate pain. 20 tablet 0   ondansetron (ZOFRAN) 8 MG tablet Take 1 tablet (8 mg total) by mouth every 8 (eight) hours as needed for nausea or vomiting. Start on the third day after carboplatin. 30 tablet 1   pantoprazole (PROTONIX) 40 MG tablet Take 1 tablet (40 mg total) by mouth 2 (two) times daily.  180 tablet 4   prochlorperazine (COMPAZINE) 10 MG tablet Take 1 tablet (10 mg total) by mouth every 6 (six) hours as needed for nausea or vomiting. 30 tablet 0   rosuvastatin (CRESTOR) 20 MG tablet TAKE ONE TABLET BY MOUTH EVERY DAY 90 tablet 1   timolol (TIMOPTIC) 0.5 % ophthalmic solution Place 1 drop into the left eye every morning.     No current facility-administered medications for this visit.    SURGICAL HISTORY:  Past Surgical History:  Procedure Laterality Date   BRONCHIAL BIOPSY  09/25/2022   Procedure: BRONCHIAL BIOPSIES;  Surgeon: Leslye Peer, MD;  Location: [facility] ENDOSCOPY;  Service: Pulmonary;;   BRONCHIAL BRUSHINGS  09/25/2022    Procedure: BRONCHIAL BRUSHINGS;  Surgeon: Leslye Peer, MD;  Location: [facility] ENDOSCOPY;  Service: Pulmonary;;   BRONCHIAL NEEDLE ASPIRATION BIOPSY  09/25/2022   Procedure: BRONCHIAL NEEDLE ASPIRATION BIOPSIES;  Surgeon: Leslye Peer, MD;  Location: [facility] ENDOSCOPY;  Service: Pulmonary;;   cataract surgery Bilateral 11/2020   COLONOSCOPY  06/08/2021   07/27/2016   DENTAL SURGERY     FIDUCIAL MARKER PLACEMENT  09/25/2022   Procedure: FIDUCIAL MARKER PLACEMENT;  Surgeon: Leslye Peer, MD;  Location: [facility] ENDOSCOPY;  Service: Pulmonary;;   HERNIA REPAIR     Umbilical & Inguinal   left knee surgery     MELANOMA EXCISION     facial   neck and shoulder injection     POLYPECTOMY     WISDOM TOOTH EXTRACTION      REVIEW OF SYSTEMS:   Review of Systems  Constitutional: Negative for appetite change, chills, fatigue, fever and unexpected weight change.  HENT:  Negative for mouth sores, nosebleeds, sore throat and trouble swallowing.   Eyes: Negative for eye problems and icterus.  Respiratory: Positive for mild baseline cough.  Negative for hemoptysis, shortness of breath and wheezing.   Cardiovascular: Negative for chest pain and leg swelling.  Gastrointestinal: Negative for abdominal pain, constipation, diarrhea, nausea and vomiting.  Genitourinary: Negative for bladder incontinence, difficulty urinating, dysuria, frequency and hematuria.   Musculoskeletal: Positive for joint pain following Neulasta injection.  Negative for back pain, gait problem, neck pain and neck stiffness.  Skin: Negative for itching and rash.  Neurological: Negative for dizziness, extremity weakness, gait problem, headaches, light-headedness and seizures.  Hematological: Negative for adenopathy. Does not bruise/bleed easily.  Psychiatric/Behavioral: Negative for confusion, depression and sleep disturbance. The patient is not nervous/anxious.     PHYSICAL EXAMINATION:  Blood pressure 120/68, pulse 80, temperature 97.6  F (36.4 C), temperature source Oral, resp. rate 16, weight 214 lb 1.6 oz (97.1 kg), SpO2 100%.  ECOG PERFORMANCE STATUS: 1  Physical Exam  Constitutional: Oriented to person, place, and time and well-developed, well-nourished, and in no distress.  HENT:  Head: Normocephalic and atraumatic.  Mouth/Throat: Oropharynx is clear and moist. No oropharyngeal exudate.  Eyes: Conjunctivae are normal. Right eye exhibits no discharge. Left eye exhibits no discharge. No scleral icterus.  Neck: Normal range of motion. Neck supple.  Cardiovascular: Normal rate, regular rhythm, normal heart sounds and intact distal pulses.   Pulmonary/Chest: Effort normal and breath sounds normal. No respiratory distress. No wheezes. No rales.  Abdominal: Soft. Bowel sounds are normal. Exhibits no distension and no mass. There is no tenderness.  Musculoskeletal: Normal range of motion. Exhibits no edema.  Lymphadenopathy:    No cervical adenopathy.  Neurological: Alert and oriented to person, place, and time. Exhibits normal muscle tone. Gait normal. Coordination normal.  Skin: Mild erythematous skin lesion/rash on upper right chest.  Skin is warm and dry.  Not diaphoretic. No erythema. No pallor.  Psychiatric: Mood, memory and judgment normal.  Vitals reviewed.  LABORATORY DATA: Lab Results  Component Value Date   WBC 7.6 01/02/2023   HGB 14.3 01/02/2023   HCT 41.7 01/02/2023   MCV 89.7 01/02/2023   PLT 208 01/02/2023      Chemistry      Component Value Date/Time   NA 139 01/02/2023 0845   NA 140 06/14/2022 0904   K 3.9 01/02/2023 0845   CL 108 01/02/2023 0845   CO2 25 01/02/2023 0845   BUN 15 01/02/2023 0845   BUN 21 06/14/2022 0904   CREATININE 0.94 01/02/2023 0845   CREATININE 0.92 02/18/2013 1431      Component Value Date/Time   CALCIUM 9.7 01/02/2023 0845   ALKPHOS 63 01/02/2023 0845   AST 13 (L) 01/02/2023 0845   ALT 18 01/02/2023 0845   BILITOT 0.6 01/02/2023 0845       RADIOGRAPHIC  STUDIES:  No results found.   ASSESSMENT/PLAN:  This is a very pleasant 72 year old Caucasian male diagnosed with stage IIb (T1c, N1, M0) non-small cell lung cancer, poorly differentiated with sarcomatoid features.  He presented with a right upper lobe lung nodule in addition to right suprahilar nodal metastasis.  He was diagnosed in June 2024.  He was seen by Dr. Dorris Fetch and felt that he may benefit from neoadjuvant chemotherapy first prior to considering surgical resection.  He is currently undergoing neoadjuvant treatment with carboplatin for an AUC of 6, paclitaxel 200 mg/m, nivolumab 360 mg IV every 3 weeks with Neulasta support.  He is status post 2 of his 3 cycles.  He is scheduled for cycle #3 today.  Labs were reviewed.  Recommend that he proceed with treatment today as schedule  I will arrange for restaging CT scan of the chest we will see him back for follow-up visit in 3 weeks.  He will continue to get weekly labs at [place].  His weekly CBC and CMP orders.  He will get labs on 10/1 when he comes in for his restaging CT scan.  I did send him 20 tablets of Norco every 6 hours if needed for the myalgias and arthralgias secondary to Neulasta.  The patient understands this is a one-time prescription since we are expecting this to be his last Neulasta injection.  The patient was advised to call immediately if he has any concerning symptoms in the interval. The patient voices understanding of current disease status and treatment options and is in agreement with the current care plan. All questions were answered. The patient knows to call the clinic with any problems, questions or concerns. We can certainly see the patient much sooner if necessary   Orders Placed This Encounter  Procedures   CT Chest W Contrast    Standing Status:   Future    Standing Expiration Date:   01/02/2024    Order Specific Question:   If indicated for the ordered procedure, I authorize the  administration of contrast media per Radiology protocol    Answer:   Yes    Order Specific Question:   Does the patient have a contrast media/X-ray dye allergy?    Answer:   No    Order Specific Question:   Preferred imaging location?    Answer:   [facility]     The total time spent in the appointment was  20-29 minutes  Doniqua Saxby L Rawlin Reaume, PA-C 01/02/23

## 2022-12-28 ENCOUNTER — Encounter: Payer: Self-pay | Admitting: Internal Medicine

## 2022-12-31 DIAGNOSIS — H401131 Primary open-angle glaucoma, bilateral, mild stage: Secondary | ICD-10-CM | POA: Diagnosis not present

## 2023-01-01 MED FILL — Dexamethasone Sodium Phosphate Inj 100 MG/10ML: INTRAMUSCULAR | Qty: 1 | Status: AC

## 2023-01-01 MED FILL — Fosaprepitant Dimeglumine For IV Infusion 150 MG (Base Eq): INTRAVENOUS | Qty: 5 | Status: AC

## 2023-01-02 ENCOUNTER — Inpatient Hospital Stay: Payer: Medicare PPO | Admitting: Physician Assistant

## 2023-01-02 ENCOUNTER — Encounter: Payer: Self-pay | Admitting: Physician Assistant

## 2023-01-02 ENCOUNTER — Inpatient Hospital Stay: Payer: Medicare PPO | Attending: Physician Assistant

## 2023-01-02 ENCOUNTER — Inpatient Hospital Stay: Payer: Medicare PPO

## 2023-01-02 ENCOUNTER — Other Ambulatory Visit: Payer: Self-pay | Admitting: Medical Oncology

## 2023-01-02 VITALS — BP 120/68 | HR 80 | Temp 97.6°F | Resp 16 | Wt 214.1 lb

## 2023-01-02 DIAGNOSIS — C3491 Malignant neoplasm of unspecified part of right bronchus or lung: Secondary | ICD-10-CM

## 2023-01-02 DIAGNOSIS — Z79899 Other long term (current) drug therapy: Secondary | ICD-10-CM | POA: Diagnosis not present

## 2023-01-02 DIAGNOSIS — Z5111 Encounter for antineoplastic chemotherapy: Secondary | ICD-10-CM | POA: Diagnosis not present

## 2023-01-02 DIAGNOSIS — C3411 Malignant neoplasm of upper lobe, right bronchus or lung: Secondary | ICD-10-CM | POA: Insufficient documentation

## 2023-01-02 DIAGNOSIS — Z5112 Encounter for antineoplastic immunotherapy: Secondary | ICD-10-CM | POA: Insufficient documentation

## 2023-01-02 DIAGNOSIS — C7801 Secondary malignant neoplasm of right lung: Secondary | ICD-10-CM | POA: Insufficient documentation

## 2023-01-02 LAB — CMP (CANCER CENTER ONLY)
ALT: 18 U/L (ref 0–44)
AST: 13 U/L — ABNORMAL LOW (ref 15–41)
Albumin: 4.5 g/dL (ref 3.5–5.0)
Alkaline Phosphatase: 63 U/L (ref 38–126)
Anion gap: 6 (ref 5–15)
BUN: 15 mg/dL (ref 8–23)
CO2: 25 mmol/L (ref 22–32)
Calcium: 9.7 mg/dL (ref 8.9–10.3)
Chloride: 108 mmol/L (ref 98–111)
Creatinine: 0.94 mg/dL (ref 0.61–1.24)
GFR, Estimated: >60 mL/min (ref >60)
Glucose, Bld: 115 mg/dL — ABNORMAL HIGH (ref 70–99)
Potassium: 3.9 mmol/L (ref 3.5–5.1)
Sodium: 139 mmol/L (ref 135–145)
Total Bilirubin: 0.6 mg/dL (ref 0.3–1.2)
Total Protein: 7.2 g/dL (ref 6.5–8.1)

## 2023-01-02 LAB — CBC WITH DIFFERENTIAL (CANCER CENTER ONLY)
Abs Immature Granulocytes: 0.03 10*3/uL (ref 0.00–0.07)
Basophils Absolute: 0.1 10*3/uL (ref 0.0–0.1)
Basophils Relative: 1 %
Eosinophils Absolute: 0.1 10*3/uL (ref 0.0–0.5)
Eosinophils Relative: 1 %
HCT: 41.7 % (ref 39.0–52.0)
Hemoglobin: 14.3 g/dL (ref 13.0–17.0)
Immature Granulocytes: 0 %
Lymphocytes Relative: 28 %
Lymphs Abs: 2.2 10*3/uL (ref 0.7–4.0)
MCH: 30.8 pg (ref 26.0–34.0)
MCHC: 34.3 g/dL (ref 30.0–36.0)
MCV: 89.7 fL (ref 80.0–100.0)
Monocytes Absolute: 0.7 10*3/uL (ref 0.1–1.0)
Monocytes Relative: 10 %
Neutro Abs: 4.5 10*3/uL (ref 1.7–7.7)
Neutrophils Relative %: 60 %
Platelet Count: 208 10*3/uL (ref 150–400)
RBC: 4.65 MIL/uL (ref 4.22–5.81)
RDW: 15.2 % (ref 11.5–15.5)
WBC Count: 7.6 10*3/uL (ref 4.0–10.5)
nRBC: 0 % (ref 0.0–0.2)

## 2023-01-02 MED ORDER — PALONOSETRON HCL INJECTION 0.25 MG/5ML
0.2500 mg | Freq: Once | INTRAVENOUS | Status: AC
  Administered 2023-01-02: 0.25 mg via INTRAVENOUS
  Filled 2023-01-02: qty 5

## 2023-01-02 MED ORDER — SODIUM CHLORIDE 0.9 % IV SOLN
10.0000 mg | Freq: Once | INTRAVENOUS | Status: AC
  Administered 2023-01-02: 10 mg via INTRAVENOUS
  Filled 2023-01-02: qty 10

## 2023-01-02 MED ORDER — SODIUM CHLORIDE 0.9 % IV SOLN: Freq: Once | INTRAVENOUS | Status: AC

## 2023-01-02 MED ORDER — HYDROCODONE-ACETAMINOPHEN 5-325 MG PO TABS: 1.0000 | ORAL_TABLET | Freq: Four times a day (QID) | ORAL | 0 refills | Status: DC | PRN

## 2023-01-02 MED ORDER — DIPHENHYDRAMINE HCL 50 MG/ML IJ SOLN
50.0000 mg | Freq: Once | INTRAMUSCULAR | Status: AC
  Administered 2023-01-02: 50 mg via INTRAVENOUS
  Filled 2023-01-02: qty 1

## 2023-01-02 MED ORDER — SODIUM CHLORIDE 0.9 % IV SOLN
150.0000 mg | Freq: Once | INTRAVENOUS | Status: AC
  Administered 2023-01-02: 150 mg via INTRAVENOUS
  Filled 2023-01-02: qty 150

## 2023-01-02 MED ORDER — SODIUM CHLORIDE 0.9 % IV SOLN
360.0000 mg | Freq: Once | INTRAVENOUS | Status: AC
  Administered 2023-01-02: 360 mg via INTRAVENOUS
  Filled 2023-01-02: qty 24

## 2023-01-02 MED ORDER — FAMOTIDINE IN NACL 20-0.9 MG/50ML-% IV SOLN
20.0000 mg | Freq: Once | INTRAVENOUS | Status: AC
  Administered 2023-01-02: 20 mg via INTRAVENOUS
  Filled 2023-01-02: qty 50

## 2023-01-02 MED ORDER — SODIUM CHLORIDE 0.9 % IV SOLN
702.6000 mg | Freq: Once | INTRAVENOUS | Status: AC
  Administered 2023-01-02: 700 mg via INTRAVENOUS
  Filled 2023-01-02: qty 70

## 2023-01-02 MED ORDER — SODIUM CHLORIDE 0.9 % IV SOLN
200.0000 mg/m2 | Freq: Once | INTRAVENOUS | Status: AC
  Administered 2023-01-02: 438 mg via INTRAVENOUS
  Filled 2023-01-02: qty 73

## 2023-01-02 NOTE — Patient Instructions (Signed)
[place] [facility]  Discharge Instructions: Thank you for choosing [facility] to provide your oncology and hematology care.   If you have a lab appointment with the Cancer Center, please go directly to the Cancer Center and check in at the registration area.   Wear comfortable clothing and clothing appropriate for easy access to any Portacath or PICC line.   We strive to give you quality time with your provider. You may need to reschedule your appointment if you arrive late (15 or more minutes).  Arriving late affects you and other patients whose appointments are after yours.  Also, if you miss three or more appointments without notifying the office, you may be dismissed from the clinic at the provider's discretion.      For prescription refill requests, have your pharmacy contact our office and allow 72 hours for refills to be completed.    Today you received the following chemotherapy and/or immunotherapy agents: Opdivo, Taxol and Carboplatin.   To help prevent nausea and vomiting after your treatment, we encourage you to take your nausea medication as directed.  BELOW ARE SYMPTOMS THAT SHOULD BE REPORTED IMMEDIATELY: *FEVER GREATER THAN 100.4 F (38 C) OR HIGHER *CHILLS OR SWEATING *NAUSEA AND VOMITING THAT IS NOT CONTROLLED WITH YOUR NAUSEA MEDICATION *UNUSUAL SHORTNESS OF BREATH *UNUSUAL BRUISING OR BLEEDING *URINARY PROBLEMS (pain or burning when urinating, or frequent urination) *BOWEL PROBLEMS (unusual diarrhea, constipation, pain near the anus) TENDERNESS IN MOUTH AND THROAT WITH OR WITHOUT PRESENCE OF ULCERS (sore throat, sores in mouth, or a toothache) UNUSUAL RASH, SWELLING OR PAIN  UNUSUAL VAGINAL DISCHARGE OR ITCHING   Items with * indicate a potential emergency and should be followed up as soon as possible or go to the Emergency Department if any problems should occur.  Please show the CHEMOTHERAPY ALERT CARD or IMMUNOTHERAPY  ALERT CARD at check-in to the Emergency Department and triage nurse.  Should you have questions after your visit or need to cancel or reschedule your appointment, please contact [place] [facility]  Dept: [phone]  and follow the prompts.  Office hours are 8:00 a.m. to 4:30 p.m. Monday - Friday. Please note that voicemails left after 4:00 p.m. may not be returned until the following business day.  We are closed weekends and major holidays. You have access to a nurse at all times for urgent questions. Please call the main number to the clinic Dept: [phone] and follow the prompts.   For any non-urgent questions, you may also contact your provider using MyChart. We now offer e-Visits for anyone 61 and older to request care online for non-urgent symptoms. For details visit [email or web address].   Also download the MyChart app! Go to the app store, search "MyChart", open the app, select , and log in with your MyChart username and password.

## 2023-01-02 NOTE — Progress Notes (Signed)
Paclitaxel diluted in NS 500 mL, Exp:09/13/2024, Lot:  [id number]   Richardean Sale, RPH, BCPS, BCOP 01/02/2023 12:21 PM

## 2023-01-03 ENCOUNTER — Encounter: Payer: Self-pay | Admitting: Internal Medicine

## 2023-01-04 ENCOUNTER — Inpatient Hospital Stay: Payer: Medicare PPO

## 2023-01-04 ENCOUNTER — Other Ambulatory Visit: Payer: Self-pay | Admitting: Medical Oncology

## 2023-01-04 VITALS — BP 133/87 | HR 94 | Temp 97.7°F | Resp 16

## 2023-01-04 DIAGNOSIS — C3491 Malignant neoplasm of unspecified part of right bronchus or lung: Secondary | ICD-10-CM

## 2023-01-04 DIAGNOSIS — C7801 Secondary malignant neoplasm of right lung: Secondary | ICD-10-CM | POA: Diagnosis not present

## 2023-01-04 DIAGNOSIS — Z5111 Encounter for antineoplastic chemotherapy: Secondary | ICD-10-CM | POA: Diagnosis not present

## 2023-01-04 DIAGNOSIS — Z5112 Encounter for antineoplastic immunotherapy: Secondary | ICD-10-CM

## 2023-01-04 DIAGNOSIS — C3411 Malignant neoplasm of upper lobe, right bronchus or lung: Secondary | ICD-10-CM | POA: Diagnosis not present

## 2023-01-04 DIAGNOSIS — Z79899 Other long term (current) drug therapy: Secondary | ICD-10-CM | POA: Diagnosis not present

## 2023-01-04 MED ORDER — PEGFILGRASTIM-CBQV 6 MG/0.6ML ~~LOC~~ SOSY
6.0000 mg | PREFILLED_SYRINGE | Freq: Once | SUBCUTANEOUS | Status: AC
  Administered 2023-01-04: 6 mg via SUBCUTANEOUS
  Filled 2023-01-04: qty 0.6

## 2023-01-04 NOTE — Progress Notes (Unsigned)
Lab order requisition printed and given to [facility] for signature.

## 2023-01-04 NOTE — Patient Instructions (Signed)

## 2023-01-09 ENCOUNTER — Other Ambulatory Visit: Payer: Medicare PPO

## 2023-01-09 ENCOUNTER — Encounter: Payer: Self-pay | Admitting: Internal Medicine

## 2023-01-09 DIAGNOSIS — C3491 Malignant neoplasm of unspecified part of right bronchus or lung: Secondary | ICD-10-CM | POA: Diagnosis not present

## 2023-01-15 ENCOUNTER — Inpatient Hospital Stay: Payer: Medicare PPO | Attending: Physician Assistant

## 2023-01-15 ENCOUNTER — Ambulatory Visit (HOSPITAL_COMMUNITY)
Admission: RE | Admit: 2023-01-15 | Discharge: 2023-01-15 | Disposition: A | Discharge status: Home / Self Care | Payer: Medicare PPO | Source: Ambulatory Visit | Attending: Physician Assistant | Admitting: Physician Assistant

## 2023-01-15 DIAGNOSIS — C3491 Malignant neoplasm of unspecified part of right bronchus or lung: Secondary | ICD-10-CM

## 2023-01-15 DIAGNOSIS — C3490 Malignant neoplasm of unspecified part of unspecified bronchus or lung: Secondary | ICD-10-CM | POA: Diagnosis not present

## 2023-01-15 DIAGNOSIS — D3502 Benign neoplasm of left adrenal gland: Secondary | ICD-10-CM | POA: Diagnosis not present

## 2023-01-15 DIAGNOSIS — C3411 Malignant neoplasm of upper lobe, right bronchus or lung: Secondary | ICD-10-CM | POA: Insufficient documentation

## 2023-01-15 DIAGNOSIS — I700 Atherosclerosis of aorta: Secondary | ICD-10-CM | POA: Diagnosis not present

## 2023-01-15 DIAGNOSIS — Z9221 Personal history of antineoplastic chemotherapy: Secondary | ICD-10-CM | POA: Diagnosis not present

## 2023-01-15 LAB — CBC WITH DIFFERENTIAL (CANCER CENTER ONLY)
Abs Immature Granulocytes: 0.16 10*3/uL — ABNORMAL HIGH (ref 0.00–0.07)
Basophils Absolute: 0.1 10*3/uL (ref 0.0–0.1)
Basophils Relative: 1 %
Eosinophils Absolute: 0.3 10*3/uL (ref 0.0–0.5)
Eosinophils Relative: 2 %
HCT: 38.5 % — ABNORMAL LOW (ref 39.0–52.0)
Hemoglobin: 13.6 g/dL (ref 13.0–17.0)
Immature Granulocytes: 1 %
Lymphocytes Relative: 19 %
Lymphs Abs: 2.8 10*3/uL (ref 0.7–4.0)
MCH: 32.2 pg (ref 26.0–34.0)
MCHC: 35.3 g/dL (ref 30.0–36.0)
MCV: 91 fL (ref 80.0–100.0)
Monocytes Absolute: 0.8 10*3/uL (ref 0.1–1.0)
Monocytes Relative: 6 %
Neutro Abs: 10.2 10*3/uL — ABNORMAL HIGH (ref 1.7–7.7)
Neutrophils Relative %: 71 %
Platelet Count: 185 10*3/uL (ref 150–400)
RBC: 4.23 MIL/uL (ref 4.22–5.81)
RDW: 15.9 % — ABNORMAL HIGH (ref 11.5–15.5)
WBC Count: 14.3 10*3/uL — ABNORMAL HIGH (ref 4.0–10.5)
nRBC: 0 % (ref 0.0–0.2)

## 2023-01-15 LAB — CMP (CANCER CENTER ONLY)
ALT: 26 U/L (ref 0–44)
AST: 19 U/L (ref 15–41)
Albumin: 4.5 g/dL (ref 3.5–5.0)
Alkaline Phosphatase: 77 U/L (ref 38–126)
Anion gap: 6 (ref 5–15)
BUN: 16 mg/dL (ref 8–23)
CO2: 27 mmol/L (ref 22–32)
Calcium: 9.9 mg/dL (ref 8.9–10.3)
Chloride: 107 mmol/L (ref 98–111)
Creatinine: 0.92 mg/dL (ref 0.61–1.24)
GFR, Estimated: >60 mL/min (ref >60)
Glucose, Bld: 136 mg/dL — ABNORMAL HIGH (ref 70–99)
Potassium: 4 mmol/L (ref 3.5–5.1)
Sodium: 140 mmol/L (ref 135–145)
Total Bilirubin: 0.4 mg/dL (ref 0.3–1.2)
Total Protein: 7.2 g/dL (ref 6.5–8.1)

## 2023-01-15 MED ORDER — IOHEXOL 300 MG/ML  SOLN
75.0000 mL | Freq: Once | INTRAMUSCULAR | Status: AC | PRN
  Administered 2023-01-15: 75 mL via INTRAVENOUS

## 2023-01-18 NOTE — Progress Notes (Unsigned)
[facility] HEMATOLOGY-ONCOLOGY TeleHEALTH VISIT PROGRESS NOTE   I connected with Brandon Schultz on 01/18/23 at  9:00 AM EDT by telephone and verified that I am speaking with the correct person using two identifiers.  I discussed the limitations, risks, security and privacy concerns of performing an evaluation and management service by telemedicine and the availability of in-person appointments. I also discussed with the patient that there may be a patient responsible charge related to this service. The patient expressed understanding and agreed to proceed.  Other persons participating in the visit and their role in the encounter: Brandon Schultz  Patient's location: Home   Provider's location: Office  [name], [place] [address] [place] [address]  DIAGNOSIS: Stage IIb (T1c, N1, M0) non-small cell lung cancer, poorly differentiated with sarcomatoid features   PRIOR THERAPY: Neoadjuvant chemoimmunotherapy with carboplatin for AUC of 6, paclitaxel 200 Mg/M2 and nivolumab 360 mg IV every 3 weeks with Neulasta support.  Last dose on 01/02/23. Status post 3 cycles.   CURRENT THERAPY: Referral to surgery for consideration of resection   INTERVAL HISTORY: Brandon Schultz 72 y.o. male and I connected via telephone visit today.  The patient is feeling well today without any concerning complaints.  He completed 3 cycles of neoadjuvant chemotherapy and immunotherapy and tolerated well overall except for joint pain secondary to Neulasta and some sleep disturbances. He had some fatigue after his last cycle of treatment but has recovered well and is feeling fairly well at this time.    Today he denies any fever, chills, night sweats, or unexplained weight loss. He states he is eating good.  He denies any significant change in his baseline breathing. He has COPD and emphysema.  Denies any significant dyspnea on exertion since he does not exert himself.  It is normal for him  to have a mild cough and his cough is at baseline and only occurs every now and then.  He denies any chest pain or hemoptysis.  Denies any nausea, vomiting, or diarrhea.  He takes laxatives if needed for constipation.  Denies any headache or visual changes.  He has a mild rash/skin lesion on his upper right chest which he is using hydrocortisone cream. He denies peripheral neuropathy.  He recently had a restaging CT scan performed.  He is here today for evaluation to review his scan results and discuss the next steps in his care.  MEDICAL HISTORY: Past Medical History:  Diagnosis Date   Allergy    seasonal   Arthritis    Asthma    Cataract    Phreesia 05/31/2020   Cervical stenosis of spine    Chronic kidney disease    Phreesia 05/31/2020   Colon polyps    Colon polyps    Complication of anesthesia    COPD (chronic obstructive pulmonary disease) (HCC)    Diverticulitis    Emphysema    Emphysema of lung (HCC)    Phreesia 05/31/2020   GERD (gastroesophageal reflux disease)    Glaucoma    Phreesia 05/31/2020   Gum disease    H/O degenerative disc disease    Hemorrhoids    Hyperlipidemia    Phreesia 05/31/2020   Inguinal hernia    Melanoma (HCC)    facial   PONV (postoperative nausea and vomiting)    Renal disease    Bright's Disease-childhood   Syncope    Umbilical hernia    Urinary tract infection  ALLERGIES:  has No Known Allergies.  MEDICATIONS:  Current Outpatient Medications  Medication Sig Dispense Refill   HYDROcodone-acetaminophen (NORCO) 5-325 MG tablet Take 1 tablet by mouth every 6 (six) hours as needed for moderate pain. 20 tablet 0   ondansetron (ZOFRAN) 8 MG tablet Take 1 tablet (8 mg total) by mouth every 8 (eight) hours as needed for nausea or vomiting. Start on the third day after carboplatin. 30 tablet 1   pantoprazole (PROTONIX) 40 MG tablet Take 1 tablet (40 mg total) by mouth 2 (two) times daily. 180 tablet 4   prochlorperazine (COMPAZINE) 10 MG  tablet Take 1 tablet (10 mg total) by mouth every 6 (six) hours as needed for nausea or vomiting. 30 tablet 0   rosuvastatin (CRESTOR) 20 MG tablet TAKE ONE TABLET BY MOUTH EVERY DAY 90 tablet 1   timolol (TIMOPTIC) 0.5 % ophthalmic solution Place 1 drop into the left eye every morning.     No current facility-administered medications for this visit.    SURGICAL HISTORY:  Past Surgical History:  Procedure Laterality Date   BRONCHIAL BIOPSY  09/25/2022   Procedure: BRONCHIAL BIOPSIES;  Surgeon: Leslye Peer, MD;  Location: [facility] ENDOSCOPY;  Service: Pulmonary;;   BRONCHIAL BRUSHINGS  09/25/2022   Procedure: BRONCHIAL BRUSHINGS;  Surgeon: Leslye Peer, MD;  Location: [facility] ENDOSCOPY;  Service: Pulmonary;;   BRONCHIAL NEEDLE ASPIRATION BIOPSY  09/25/2022   Procedure: BRONCHIAL NEEDLE ASPIRATION BIOPSIES;  Surgeon: Leslye Peer, MD;  Location: [facility] ENDOSCOPY;  Service: Pulmonary;;   cataract surgery Bilateral 11/2020   COLONOSCOPY  06/08/2021   07/27/2016   DENTAL SURGERY     FIDUCIAL MARKER PLACEMENT  09/25/2022   Procedure: FIDUCIAL MARKER PLACEMENT;  Surgeon: Leslye Peer, MD;  Location: [facility] ENDOSCOPY;  Service: Pulmonary;;   HERNIA REPAIR     Umbilical & Inguinal   left knee surgery     MELANOMA EXCISION     facial   neck and shoulder injection     POLYPECTOMY     WISDOM TOOTH EXTRACTION      REVIEW OF SYSTEMS:   Review of Systems  Constitutional: Positive for fatigue following treatment (recovered at this time). Negative for appetite change, chills, fever and unexpected weight change.  HENT: Negative for mouth sores, nosebleeds, sore throat and trouble swallowing.   Eyes: Negative for eye problems and icterus.  Respiratory: Positive for mild cough. Negative for hemoptysis, shortness of breath and wheezing.   Cardiovascular: Negative for chest pain and leg swelling.  Gastrointestinal: Negative for abdominal pain, constipation, diarrhea, nausea and vomiting.  Genitourinary:  Negative for bladder incontinence, difficulty urinating, dysuria, frequency and hematuria.   Musculoskeletal: Positive for joint pain following Neulasta injection.  Negative for back pain, gait problem, neck pain and neck stiffness.  Skin: Negative for itching and rash.  Neurological: Negative for dizziness, extremity weakness, gait problem, headaches, light-headedness and seizures.  Hematological: Negative for adenopathy. Does not bruise/bleed easily.  Psychiatric/Behavioral: Positive for some insomnia. Negative for confusion, depression. The patient is not nervous/anxious.     PHYSICAL EXAMINATION:  There were no vitals taken for this visit.  ECOG PERFORMANCE STATUS: 1  Physical Exam  Constitutional: Oriented to person, place, and time Psychiatric: Mood, memory and judgment normal.  Vitals reviewed.  LABORATORY DATA: Lab Results  Component Value Date   WBC 14.3 (H) 01/15/2023   HGB 13.6 01/15/2023   HCT 38.5 (L) 01/15/2023   MCV 91.0 01/15/2023   PLT 185 01/15/2023  Chemistry      Component Value Date/Time   NA 140 01/15/2023 1410   NA 140 06/14/2022 0904   K 4.0 01/15/2023 1410   CL 107 01/15/2023 1410   CO2 27 01/15/2023 1410   BUN 16 01/15/2023 1410   BUN 21 06/14/2022 0904   CREATININE 0.92 01/15/2023 1410   CREATININE 0.92 02/18/2013 1431      Component Value Date/Time   CALCIUM 9.9 01/15/2023 1410   ALKPHOS 77 01/15/2023 1410   AST 19 01/15/2023 1410   ALT 26 01/15/2023 1410   BILITOT 0.4 01/15/2023 1410       RADIOGRAPHIC STUDIES:  No results found.   ASSESSMENT/PLAN:  This is a very pleasant 72 year old Caucasian male diagnosed with stage IIb (T1c, N1, M0) non-small cell lung cancer, poorly differentiated with sarcomatoid features.  He presented with a right upper lobe lung nodule in addition to right suprahilar nodal metastasis.  He was diagnosed in June 2024.   He was seen by Dr. Dorris Fetch and felt that he may benefit from neoadjuvant  chemotherapy first prior to considering surgical resection.   He completed 3 cycles of neoadjuvant treatment with carboplatin for an AUC of 6, paclitaxel 200 mg/m, nivolumab 360 mg IV every 3 weeks with Neulasta support.  His last dose was on 01/02/23.   The patient recently had a restaging CT scan performed.  The patient was seen with Dr. Arbutus Ped today.  Dr. Arbutus Ped personally independently reviewed his scan results and discussed the results with the patient today.  The scan showed reduction in size of the RUL mass. The previously hypermetabolic right hilar node also has reduced in size.   We will refer him back to cardiothoracic surgery for consideration of surgical resection.    If the patient is not a candidate for surgical resection, then we will see him back to discuss other options.   If he proceeds with surgery, we would like to see him back after surgery to discuss surveillance imaging.   I discussed the assessment and treatment plan with the patient. The patient was provided an opportunity to ask questions and all were answered. The patient agreed with the plan and demonstrated an understanding of the instructions.  The patient was advised to call back or seek an in-person evaluation if the symptoms worsen or if the condition fails to improve as anticipated.  I provided 25 minutes of non face-to-face telephone visit time during this encounter, and > 50% was spent counseling as documented under my assessment & plan.  Richa Shor L Davanta Meuser, PA-C 01/18/2023 3:22 PM  No orders of the defined types were placed in this encounter.    Rondarius Kadrmas L Russell Engelstad, PA-C 01/18/23  ADDENDUM: Hematology/Oncology Attending: I contributed to the virtual telephone visit today.  I reviewed his record, lab, scan and recommended his care plan.  This is a very pleasant 72 years old white male with history of a stage IIb (T1c, N1, M0) non-small cell lung cancer, poorly differentiated with sarcomatoid  features status post a course of neoadjuvant chemoimmunotherapy with carboplatin, paclitaxel and nivolumab with Neulasta support for 3 cycles.  The patient tolerated his treatment well except for the fatigue from the Neulasta injection. He had repeat CT scan of the chest performed recently.  I personally and independently reviewed the scan images and discussed the result with the patient and his wife today.  His scan showed substantial reduction in the size and solid CT of the previous right upper lobe mass with adjacent 4  additional noted.  There was also improvement in the previously hypermetabolic right hilar lymph node indicating good response to the neoadjuvant treatment. I recommended for the patient to see cardiothoracic surgery Dr. Dorris Fetch for consideration of surgical resection. We will see the patient for follow-up visit after the surgery for any additional recommendation and close monitoring. The patient was advised to call immediately if he has any other concerning symptoms in the interval. Disclaimer: This note was dictated with voice recognition software. Similar sounding words can inadvertently be transcribed and may be missed upon review. Lajuana Matte, MD

## 2023-01-22 DIAGNOSIS — Z5112 Encounter for antineoplastic immunotherapy: Secondary | ICD-10-CM | POA: Diagnosis not present

## 2023-01-24 ENCOUNTER — Other Ambulatory Visit: Payer: Medicare PPO

## 2023-01-24 ENCOUNTER — Inpatient Hospital Stay: Payer: Medicare PPO | Admitting: Physician Assistant

## 2023-01-24 DIAGNOSIS — C3491 Malignant neoplasm of unspecified part of right bronchus or lung: Secondary | ICD-10-CM | POA: Diagnosis not present

## 2023-01-25 ENCOUNTER — Encounter: Payer: Self-pay | Admitting: Internal Medicine

## 2023-02-19 ENCOUNTER — Ambulatory Visit: Payer: Medicare PPO | Admitting: Thoracic Surgery (Cardiothoracic Vascular Surgery)

## 2023-02-21 ENCOUNTER — Encounter: Payer: Self-pay | Admitting: Thoracic Surgery (Cardiothoracic Vascular Surgery)

## 2023-02-21 ENCOUNTER — Ambulatory Visit: Payer: Medicare PPO | Admitting: Thoracic Surgery (Cardiothoracic Vascular Surgery)

## 2023-02-21 VITALS — BP 122/84 | HR 104 | Resp 20 | Ht 70.0 in | Wt 213.0 lb

## 2023-02-21 DIAGNOSIS — C3491 Malignant neoplasm of unspecified part of right bronchus or lung: Secondary | ICD-10-CM | POA: Diagnosis not present

## 2023-02-21 NOTE — Progress Notes (Signed)
[address]       Brandon Schultz [address]             [phone]     HPI: Mr. Snelson i returns to discuss surgery for stage IIb non-small cell carcinoma of the right upper lobe.   Brandon Schultz is a 72 year old former smoker with a past medical history significant for tobacco abuse, COPD, coronary calcification on CT, hyperlipidemia, melanoma, von Willebrand's disorder, chronic kidney disease, reflux, glaucoma, and arthritis.  80-pack-year history of smoking prior to quitting in 2016.  Found to have a lung nodule on a low-dose CT in March.  Nodule was persistent on follow-up.  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  PET/CT 10/11/2022 showed the nodule was hypermetabolic.  There also was a hypermetabolic right hilar node and an adrenal adenoma.  Findings consistent with stage IIb (T1, N1) non-small cell carcinoma.  He was referred to Dr. Arbutus Ped and had neoadjuvant chemoimmunotherapy.  He tolerated the chemo portion well.  He had a lot of arthralgias with the immunotherapy.  Recently had a follow-up scan which showed a partial response.  He lost about 5 or 6 pounds during his treatment.  Also had some hair loss.  That is starting to grow back.  No chest pain, pressure, tightness, shortness of breath.  Zubrod Score: At the time of surgery this patient's most appropriate activity status/level should be described as: [x]     0    Normal activity, no symptoms []     1    Restricted in physical strenuous activity but ambulatory, able to do out light work []     2    Ambulatory and capable of self care, unable to do work activities, up and about >50 % of waking hours                              []     3    Only limited self care, in bed greater than 50% of waking hours []     4    Completely disabled, no self care, confined to bed or chair []     5    Moribund  Past Medical History:  Diagnosis Date   Allergy    seasonal   Arthritis     Asthma    Cataract    Phreesia 05/31/2020   Cervical stenosis of spine    Chronic kidney disease    Phreesia 05/31/2020   Colon polyps    Colon polyps    Complication of anesthesia    COPD (chronic obstructive pulmonary disease) (HCC)    Diverticulitis    Emphysema    Emphysema of lung (HCC)    Phreesia 05/31/2020   GERD (gastroesophageal reflux disease)    Glaucoma    Phreesia 05/31/2020   Gum disease    H/O degenerative disc disease    Hemorrhoids    Hyperlipidemia    Phreesia 05/31/2020   Inguinal hernia    Melanoma (HCC)    facial   PONV (postoperative nausea and vomiting)    Renal disease    Bright's Disease-childhood   Syncope    Umbilical hernia    Urinary tract infection    Past Surgical History:  Procedure Laterality Date   BRONCHIAL BIOPSY  09/25/2022   Procedure: BRONCHIAL BIOPSIES;  Surgeon: Leslye Peer, MD;  Location: [facility] ENDOSCOPY;  Service: Pulmonary;;   BRONCHIAL BRUSHINGS  09/25/2022  Procedure: BRONCHIAL BRUSHINGS;  Surgeon: Leslye Peer, MD;  Location: [facility] ENDOSCOPY;  Service: Pulmonary;;   BRONCHIAL NEEDLE ASPIRATION BIOPSY  09/25/2022   Procedure: BRONCHIAL NEEDLE ASPIRATION BIOPSIES;  Surgeon: Leslye Peer, MD;  Location: [facility] ENDOSCOPY;  Service: Pulmonary;;   cataract surgery Bilateral 11/2020   COLONOSCOPY  06/08/2021   07/27/2016   DENTAL SURGERY     FIDUCIAL MARKER PLACEMENT  09/25/2022   Procedure: FIDUCIAL MARKER PLACEMENT;  Surgeon: Leslye Peer, MD;  Location: [facility] ENDOSCOPY;  Service: Pulmonary;;   HERNIA REPAIR     Umbilical & Inguinal   left knee surgery     MELANOMA EXCISION     facial   neck and shoulder injection     POLYPECTOMY     WISDOM TOOTH EXTRACTION      Current Outpatient Medications  Medication Sig Dispense Refill   HYDROcodone-acetaminophen (NORCO) 5-325 MG tablet Take 1 tablet by mouth every 6 (six) hours as needed for moderate pain. 20 tablet 0   ondansetron (ZOFRAN) 8 MG tablet Take 1 tablet (8 mg total)  by mouth every 8 (eight) hours as needed for nausea or vomiting. Start on the third day after carboplatin. 30 tablet 1   pantoprazole (PROTONIX) 40 MG tablet Take 1 tablet (40 mg total) by mouth 2 (two) times daily. 180 tablet 4   prochlorperazine (COMPAZINE) 10 MG tablet Take 1 tablet (10 mg total) by mouth every 6 (six) hours as needed for nausea or vomiting. 30 tablet 0   rosuvastatin (CRESTOR) 20 MG tablet TAKE ONE TABLET BY MOUTH EVERY DAY 90 tablet 1   timolol (TIMOPTIC) 0.5 % ophthalmic solution Place 1 drop into the left eye every morning.     No current facility-administered medications for this visit.    Physical Exam BP 122/84 (BP Location: Left Arm, Patient Position: Sitting, Cuff Size: Normal)   Pulse (!) 104   Resp 20   Ht 5\' 10"  (1.778 m)   Wt 213 lb (96.6 kg)   SpO2 97% Comment: RA  BMI 30.45 kg/m  72 year old man in no acute distress Alert and oriented x 3 with no focal motor deficits Well-developed and well-nourished No cervical or supra victor adenopathy Cardiac regular rate and rhythm, no rubs, murmurs or gallops Lungs clear with equal breath sounds bilaterally No clubbing, cyanosis, or edema  Diagnostic Tests: CT CHEST WITH CONTRAST   TECHNIQUE: Multidetector CT imaging of the chest was performed during intravenous contrast administration.   RADIATION DOSE REDUCTION: This exam was performed according to the departmental dose-optimization program which includes automated exposure control, adjustment of the mA and/or kV according to patient size and/or use of iterative reconstruction technique.   CONTRAST:  75mL OMNIPAQUE IOHEXOL 300 MG/ML  SOLN   COMPARISON:  PET/CT 10/11/2022   FINDINGS: Cardiovascular: Coronary, aortic arch, and branch vessel atherosclerotic vascular disease.   Mediastinum/Nodes: Previous hypermetabolic right hilar node currently measures 1.0 cm in diameter, previously about 1.9 cm on 10/11/2022.   Lungs/Pleura: Substantial  reduction in size and solidity of the previous right upper lobe mass, with adjacent fiducial noted. This has a band of more solid component measuring about 1.8 by 0.5 cm on image 54 of series 5 with the otherwise subsolid lesion measuring 2.2 by 1.7 cm on image 53 of series 5, previously 2.9 by 2.4 cm.   Peripheral interstitial accentuation in the lungs suggesting mild fibrosis.   Upper Abdomen: Stable 1.2 by 1.8 cm mass of the medial limb left adrenal gland, previously  characterized as adrenal adenoma based on density. Benign fluid density right kidney upper pole cyst. No further imaging workup of these lesions is indicated.   Abdominal aortic atherosclerosis.   Musculoskeletal: Thoracic spondylosis.   IMPRESSION: 1. Substantial reduction in size and solidity of the previous right upper lobe mass, with adjacent fiducial noted. This has an anteromedial band of more solid component measuring about 1.8 by 0.5 cm with the otherwise subsolid lesion measuring 2.2 by 1.7 cm, previously 2.9 by 2.4 cm. 2. Previous hypermetabolic right hilar node currently measures 1.0 cm in diameter, previously about 1.9 cm on 10/11/2022. 3. Stable left adrenal adenoma. 4. Aortic and coronary atherosclerosis.   Aortic Atherosclerosis (ICD10-I70.0).     Electronically Signed   By: Gaylyn Rong M.D.   On: 01/23/2023 12:36 I personally reviewed the CT images.  There has been a decrease in size of the primary nodule as well as a significant reduction in the solid component.  Decrease in size of the right hilar node.  Pulmonary function testing 10/11/2022 FVC 5.18 (117%) FEV1 3.74 (114%) DLCO 20.64 (79%)  Impression: Brandon Schultz is a 72 year old former smoker with a past medical history significant for tobacco abuse, COPD, coronary calcification on CT, hyperlipidemia, melanoma, von Willebrand's disorder, chronic kidney disease, reflux, glaucoma, and arthritis.    Stage IIb non-small  cell carcinoma right upper lobe-status post neoadjuvant chemoimmunotherapy.  Good partial response.  Discussed options of surgical resection versus definitive chemoradiation.  He understands the advantages and disadvantages.  Strongly prefers to proceed with surgery.  Discussed matters regarding potential resectability and possible need for pneumonectomy.  He does have adequate reserve to tolerate that should it be necessary.  I informed Mr. and Mrs. Folden of the general nature of the procedure including the need for general anesthesia, the incisions to be used, the use of the robot, possible need for conversion to open, the use of a drainage tube postoperatively, the expected hospital stay, and the overall recovery.  I informed them of the indications, risks, benefits, and alternatives.  They understand the risks include, but are not limited to death, MI, DVT, PE, bleeding, possible need for transfusion, infection, prolonged air leak, conversion to open procedure, cardiac arrhythmias, as well as the possibility of other unforeseeable complications.  High risk for bleeding complications due to von Willebrand's.  Will plan to use transexamic acid and DDAVP.  Coronary artery disease-coronary calcification on CT.  No anginal symptoms.  Very active.  Plan: Will call to schedule robotic assisted right upper lobectomy, possible pneumonectomy.    [name], MD [facility] [phone]

## 2023-02-21 NOTE — H&P (View-Only) (Signed)
 [address]       Brandon Schultz [address]             [phone]     HPI: 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   Brandon Schultz is a 72 year old former smoker with a past medical history significant for tobacco abuse, COPD, coronary calcification on CT, hyperlipidemia, melanoma, von Willebrand's disorder, chronic kidney disease, reflux, glaucoma, and arthritis.  80-pack-year history of smoking prior to quitting in 2016.  Found to have a lung nodule on a low-dose CT in March.  Nodule was persistent on follow-up.  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  PET/CT 10/11/2022 showed the nodule was hypermetabolic.  There also was a hypermetabolic right hilar node and an adrenal adenoma.  Findings consistent with stage IIb (T1, N1) non-small cell carcinoma.  He was referred to Dr. Arbutus Ped and had neoadjuvant chemoimmunotherapy.  He tolerated the chemo portion well.  He had a lot of arthralgias with the immunotherapy.  Recently had a follow-up scan which showed a partial response.  He lost about 5 or 6 pounds during his treatment.  Also had some hair loss.  That is starting to grow back.  No chest pain, pressure, tightness, shortness of breath.  Zubrod Score: At the time of surgery this patient's most appropriate activity status/level should be described as: [x]     0    Normal activity, no symptoms []     1    Restricted in physical strenuous activity but ambulatory, able to do out light work []     2    Ambulatory and capable of self care, unable to do work activities, up and about >50 % of waking hours                              []     3    Only limited self care, in bed greater than 50% of waking hours []     4    Completely disabled, no self care, confined to bed or chair []     5    Moribund  Past Medical History:  Diagnosis Date   Allergy    seasonal   Arthritis     Asthma    Cataract    Phreesia 05/31/2020   Cervical stenosis of spine    Chronic kidney disease    Phreesia 05/31/2020   Colon polyps    Colon polyps    Complication of anesthesia    COPD (chronic obstructive pulmonary disease) (HCC)    Diverticulitis    Emphysema    Emphysema of lung (HCC)    Phreesia 05/31/2020   GERD (gastroesophageal reflux disease)    Glaucoma    Phreesia 05/31/2020   Gum disease    H/O degenerative disc disease    Hemorrhoids    Hyperlipidemia    Phreesia 05/31/2020   Inguinal hernia    Melanoma (HCC)    facial   PONV (postoperative nausea and vomiting)    Renal disease    Bright's Disease-childhood   Syncope    Umbilical hernia    Urinary tract infection    Past Surgical History:  Procedure Laterality Date   BRONCHIAL BIOPSY  09/25/2022   Procedure: BRONCHIAL BIOPSIES;  Surgeon: Leslye Peer, MD;  Location: [facility] ENDOSCOPY;  Service: Pulmonary;;   BRONCHIAL BRUSHINGS  09/25/2022  Procedure: BRONCHIAL BRUSHINGS;  Surgeon: Leslye Peer, MD;  Location: [facility] ENDOSCOPY;  Service: Pulmonary;;   BRONCHIAL NEEDLE ASPIRATION BIOPSY  09/25/2022   Procedure: BRONCHIAL NEEDLE ASPIRATION BIOPSIES;  Surgeon: Leslye Peer, MD;  Location: [facility] ENDOSCOPY;  Service: Pulmonary;;   cataract surgery Bilateral 11/2020   COLONOSCOPY  06/08/2021   07/27/2016   DENTAL SURGERY     FIDUCIAL MARKER PLACEMENT  09/25/2022   Procedure: FIDUCIAL MARKER PLACEMENT;  Surgeon: Leslye Peer, MD;  Location: [facility] ENDOSCOPY;  Service: Pulmonary;;   HERNIA REPAIR     Umbilical & Inguinal   left knee surgery     MELANOMA EXCISION     facial   neck and shoulder injection     POLYPECTOMY     WISDOM TOOTH EXTRACTION      Current Outpatient Medications  Medication Sig Dispense Refill   HYDROcodone-acetaminophen (NORCO) 5-325 MG tablet Take 1 tablet by mouth every 6 (six) hours as needed for moderate pain. 20 tablet 0   ondansetron (ZOFRAN) 8 MG tablet Take 1 tablet (8 mg total)  by mouth every 8 (eight) hours as needed for nausea or vomiting. Start on the third day after carboplatin. 30 tablet 1   pantoprazole (PROTONIX) 40 MG tablet Take 1 tablet (40 mg total) by mouth 2 (two) times daily. 180 tablet 4   prochlorperazine (COMPAZINE) 10 MG tablet Take 1 tablet (10 mg total) by mouth every 6 (six) hours as needed for nausea or vomiting. 30 tablet 0   rosuvastatin (CRESTOR) 20 MG tablet TAKE ONE TABLET BY MOUTH EVERY DAY 90 tablet 1   timolol (TIMOPTIC) 0.5 % ophthalmic solution Place 1 drop into the left eye every morning.     No current facility-administered medications for this visit.    Physical Exam BP 122/84 (BP Location: Left Arm, Patient Position: Sitting, Cuff Size: Normal)   Pulse (!) 104   Resp 20   Ht 5\' 10"  (1.778 m)   Wt 213 lb (96.6 kg)   SpO2 97% Comment: RA  BMI 30.45 kg/m  72 year old man in no acute distress Alert and oriented x 3 with no focal motor deficits Well-developed and well-nourished No cervical or supra victor adenopathy Cardiac regular rate and rhythm, no rubs, murmurs or gallops Lungs clear with equal breath sounds bilaterally No clubbing, cyanosis, or edema  Diagnostic Tests: CT CHEST WITH CONTRAST   TECHNIQUE: Multidetector CT imaging of the chest was performed during intravenous contrast administration.   RADIATION DOSE REDUCTION: This exam was performed according to the departmental dose-optimization program which includes automated exposure control, adjustment of the mA and/or kV according to patient size and/or use of iterative reconstruction technique.   CONTRAST:  75mL OMNIPAQUE IOHEXOL 300 MG/ML  SOLN   COMPARISON:  PET/CT 10/11/2022   FINDINGS: Cardiovascular: Coronary, aortic arch, and branch vessel atherosclerotic vascular disease.   Mediastinum/Nodes: Previous hypermetabolic right hilar node currently measures 1.0 cm in diameter, previously about 1.9 cm on 10/11/2022.   Lungs/Pleura: Substantial  reduction in size and solidity of the previous right upper lobe mass, with adjacent fiducial noted. This has a band of more solid component measuring about 1.8 by 0.5 cm on image 54 of series 5 with the otherwise subsolid lesion measuring 2.2 by 1.7 cm on image 53 of series 5, previously 2.9 by 2.4 cm.   Peripheral interstitial accentuation in the lungs suggesting mild fibrosis.   Upper Abdomen: Stable 1.2 by 1.8 cm mass of the medial limb left adrenal gland, previously  characterized as adrenal adenoma based on density. Benign fluid density right kidney upper pole cyst. No further imaging workup of these lesions is indicated.   Abdominal aortic atherosclerosis.   Musculoskeletal: Thoracic spondylosis.   IMPRESSION: 1. Substantial reduction in size and solidity of the previous right upper lobe mass, with adjacent fiducial noted. This has an anteromedial band of more solid component measuring about 1.8 by 0.5 cm with the otherwise subsolid lesion measuring 2.2 by 1.7 cm, previously 2.9 by 2.4 cm. 2. Previous hypermetabolic right hilar node currently measures 1.0 cm in diameter, previously about 1.9 cm on 10/11/2022. 3. Stable left adrenal adenoma. 4. Aortic and coronary atherosclerosis.   Aortic Atherosclerosis (ICD10-I70.0).     Electronically Signed   By: Gaylyn Rong M.D.   On: 01/23/2023 12:36 I personally reviewed the CT images.  There has been a decrease in size of the primary nodule as well as a significant reduction in the solid component.  Decrease in size of the right hilar node.  Pulmonary function testing 10/11/2022 FVC 5.18 (117%) FEV1 3.74 (114%) DLCO 20.64 (79%)  Impression: Brandon Schultz is a 72 year old former smoker with a past medical history significant for tobacco abuse, COPD, coronary calcification on CT, hyperlipidemia, melanoma, von Willebrand's disorder, chronic kidney disease, reflux, glaucoma, and arthritis.    Stage IIb non-small  cell carcinoma right upper lobe-status post neoadjuvant chemoimmunotherapy.  Good partial response.  Discussed options of surgical resection versus definitive chemoradiation.  He understands the advantages and disadvantages.  Strongly prefers to proceed with surgery.  Discussed matters regarding potential resectability and possible need for pneumonectomy.  He does have adequate reserve to tolerate that should it be necessary.  I informed Mr. and Mrs. Folden of the general nature of the procedure including the need for general anesthesia, the incisions to be used, the use of the robot, possible need for conversion to open, the use of a drainage tube postoperatively, the expected hospital stay, and the overall recovery.  I informed them of the indications, risks, benefits, and alternatives.  They understand the risks include, but are not limited to death, MI, DVT, PE, bleeding, possible need for transfusion, infection, prolonged air leak, conversion to open procedure, cardiac arrhythmias, as well as the possibility of other unforeseeable complications.  High risk for bleeding complications due to von Willebrand's.  Will plan to use transexamic acid and DDAVP.  Coronary artery disease-coronary calcification on CT.  No anginal symptoms.  Very active.  Plan: Will call to schedule robotic assisted right upper lobectomy, possible pneumonectomy.    [name], MD [facility] [phone]

## 2023-02-22 ENCOUNTER — Encounter: Payer: Self-pay | Admitting: *Deleted

## 2023-02-22 ENCOUNTER — Other Ambulatory Visit: Payer: Self-pay | Admitting: *Deleted

## 2023-02-22 DIAGNOSIS — C3491 Malignant neoplasm of unspecified part of right bronchus or lung: Secondary | ICD-10-CM

## 2023-02-24 ENCOUNTER — Other Ambulatory Visit: Payer: Self-pay

## 2023-02-25 ENCOUNTER — Ambulatory Visit: Payer: Medicare PPO | Admitting: Family Medicine

## 2023-02-25 NOTE — Progress Notes (Addendum)
Anesthesia Chart Review:  Case: [id number] Date/Time: 03/07/23 0745   Procedures:      XI ROBOTIC ASSISTED THORACOSCOPY-RIGHT UPPER LOBECTOMY, possible pneumonectomy (Right: Chest)     possible PNEUMONECTOMY   Anesthesia type: General   Pre-op diagnosis: RUL LUNG CANCER   [facility]: [facility] ROOM 10 / [facility] OR   Surgeons: [name], MD       DISCUSSION: Patient is a 72-year male scheduled for the above procedure. S/p video bronchoscopy with robotic assisted navigation 09/25/22 by Dr. Delton Coombes for evaluation of RUL nodule. Cytology revealed poorly differentiated cells, likely a non-small cell carcinoma of lung with sarcomatoid features. He was started on chemoimmunotherapy on 11/21/22 with "partial" response. He desired to proceed with surgery.   History includes former smoker (quit 04/16/14), post-operative N/V, asthma, COPD/emphysema, lung cancer (09/25/22), HLD, CKD, syncope (07/02/08), GERD, glaucoma, melanoma (s/p excision), cervical stenosis, hernia (repair of epigastric & umbilical hernia 01/22/06)  By notes, von Willebrand's disease discovered during knee surgery. He had Desmopressin with hernia surgery. Per Dr. Dorris Fetch: "High risk for bleeding complications due to von Willebrand's.  Will plan to use transexamic acid and DDAVP."  Dr. Dorris Fetch classified his Zubrod Score as 0:  Normal activity, no symptoms   Last cardiology with was on 06/08/22 with Edd Fabian, NP. Patient had a CAD of 1733 (93rd percentile) in January 2023. He subsequently underwent a nuclear stress test on 05/10/21 which was low risk no ischemia, EF 61%. 05/22/21 echo showed LVEF 60-65%, no regional wall motion abnormalities, normal RV systolic function, aortic root 40 mm. Asymptomatic. On statin therapy. 12 month follow-up planned.   Discussed with anesthesiologist Anice Paganini, MD. Recommend hematology also weigh in regarding perioperative von Willebrand's management. Ryan at [facility]. Additional input pending  03/05/23 PAT visit.   ADDENDUM 03/06/23 3:17 PM: Based on anesthesiologist's recommendations, Dr. Dorris Fetch did reach out to oncologist Dr. Arbutus Ped regarding perioperative recommendations given von Willebrand's history. According to communication I received, Dr. Arbutus Ped did not recommend any additional preoperative labs and was in agreement with TXA prior to surgery an DDAVP 0.3 mcg/kg 30 minutes before surgery. Dr. Dorris Fetch is planning to enter orders on the day of surgery.     VS: BP 135/89   Pulse 96   Temp (!) 36.4 C   Resp 18   Ht 5\' 11"  (1.803 m)   Wt 96.4 kg   SpO2 96%   BMI 29.64 kg/m    PROVIDERS: [name], PA is PCP  Si Gaul, MD is Dawna Part, MD is pulmonologist Epifanio Lesches, MD is cardiologist Marsa Aris, MD is GI   PFTs 10/11/22:  FVC 5.18 (117%) FEV1 3.74 (114%) DLCO 20.64 (79%)   LABS: PAT labs reviewed. CBC, PT/INR, and CMP normal except glucose 119. PTT mildly elevated at 38. T&S done. COVID-19 test negative.  (all labs ordered are listed, but only abnormal results are displayed)  Labs Reviewed  APTT - Abnormal; Notable for the following components:      Result Value   aPTT 38 (*)    All other components within normal limits  COMPREHENSIVE METABOLIC PANEL - Abnormal; Notable for the following components:   Glucose, Bld 119 (*)    All other components within normal limits  SURGICAL PCR SCREEN  SARS CORONAVIRUS 2 (TAT 6-24 HRS)  CBC  PROTIME-INR  URINALYSIS, ROUTINE W REFLEX MICROSCOPIC  TYPE AND SCREEN    As of 01/15/23: WBC  14.3, H/H 13.6/38.5, PLT 185, CMP normal except glucose 136.  Hematology labs:  - 12/24/05: Factor VIII Activity 64% (L) -> 146 % (Normal 75-150%) Von Willebrand Factor (vWF) Ag 56 % -> 165%  (Normal 61-164%) Ristocetin-Cofactor 51% -> > 150% (Normal 50 - 150%)   - 12/10/05:  Factor VIII Activity 71% (L) (Normal 75-150%) Von Willebrand Factor (vWF) Ag 82 % (Normal  61-164%) Bleeding Time 7.0 PT 14.7 / INR 1.1 / APTT 38   IMAGES: CXR 03/05/23: In process.  CT Chest 01/15/23: IMPRESSION: 1. Substantial reduction in size and solidity of the previous right upper lobe mass, with adjacent fiducial noted. This has an anteromedial band of more solid component measuring about 1.8 by 0.5 cm with the otherwise subsolid lesion measuring 2.2 by 1.7 cm, previously 2.9 by 2.4 cm. 2. Previous hypermetabolic right hilar node currently measures 1.0 cm in diameter, previously about 1.9 cm on 10/11/2022. 3. Stable left adrenal adenoma. 4. Aortic and coronary atherosclerosis.   PET Scan 10/11/22: IMPRESSION: 1. Posterior right upper lobe primary bronchogenic carcinoma with right suprahilar nodal metastasis. T1bN1M0 or stage IIB 2. Mildly hypermetabolic left adrenal adenoma. In the absence of clinically indicated signs/symptoms require(s) no independent follow-up. 3. Incidental findings, including: Aortic atherosclerosis (ICD10-I70.0), coronary artery atherosclerosis and emphysema (ICD10-J43.9).   EKG: EKG 03/05/23: NSR   CV: Echo 05/22/21: IMPRESSIONS   1. Left ventricular ejection fraction, by estimation, is 60 to 65%. The  left ventricle has normal function. The left ventricle has no regional  wall motion abnormalities. Left ventricular diastolic parameters were  normal. The average left ventricular  global longitudinal strain is -21.3 %.   2. Right ventricular systolic function is normal. The right ventricular  size is normal. Tricuspid regurgitation signal is inadequate for assessing  PA pressure.   3. The mitral valve is normal in structure. No evidence of mitral valve  regurgitation. No evidence of mitral stenosis.   4. The aortic valve is tricuspid. Aortic valve regurgitation is not  visualized. No aortic stenosis is present.   5. Aortic dilatation noted. There is mild dilatation of the aortic root,  measuring 40 mm.   6. The inferior vena  cava is normal in size with <50% respiratory  variability, suggesting right atrial pressure of 8 mmHg.    Nuclear stress test 05/10/21:   The study is normal. The study is low risk.   Patient exercised according to the [facility] protocol for 8:85min achieving 10.0 METs   Target HR was achieved (144bpm; 95% MPHR)   LV perfusion is normal. There is no evidence of ischemia. There is no evidence of infarction.   Left ventricular function is normal. Nuclear stress EF: 61 %. The left ventricular ejection fraction is normal (55-65%). End diastolic cavity size is normal.   Prior study not available for comparison.    CT Cardiac Calcium Scoring 05/02/21: FINDINGS: Coronary arteries: Normal origins. Coronary Calcium Score: Left main: 124 Left anterior descending artery: 493 Left circumflex artery: 177 Right coronary artery: 939 Total: 1733 Percentile: 93 Pericardium: Normal.   Aorta: Normal to borderline dilated caliber of ascending aorta (38 mm). Aortic atherosclerosis noted.    IMPRESSION: Coronary calcium score of 1733. This was 93rd percentile for age-, race-, and sex-matched controls.   RECOMMENDATIONS:... Cardiology referral should be considered for patients with CAC scores >=400 or >=75th percentile...    Cardiac Cath 02/05/00: CONCLUSION: Normal coronary angiography and left ventricular wall motion.    Past Medical History:  Diagnosis Date   Allergy    seasonal   Arthritis  Asthma    Cataract    Phreesia 05/31/2020   Cervical stenosis of spine    Chronic kidney disease    Phreesia 05/31/2020   Colon polyps    Colon polyps    Complication of anesthesia    COPD (chronic obstructive pulmonary disease) (HCC)    Diverticulitis    Emphysema    Emphysema of lung (HCC)    Phreesia 05/31/2020   GERD (gastroesophageal reflux disease)    Glaucoma    Phreesia 05/31/2020   Gum disease    H/O degenerative disc disease    Hemorrhoids    Hyperlipidemia    Phreesia  05/31/2020   Inguinal hernia    Melanoma (HCC)    facial   PONV (postoperative nausea and vomiting)    Renal disease    Bright's Disease-childhood   Syncope    Umbilical hernia    Urinary tract infection     Past Surgical History:  Procedure Laterality Date   BRONCHIAL BIOPSY  09/25/2022   Procedure: BRONCHIAL BIOPSIES;  Surgeon: Leslye Peer, MD;  Location: [facility] ENDOSCOPY;  Service: Pulmonary;;   BRONCHIAL BRUSHINGS  09/25/2022   Procedure: BRONCHIAL BRUSHINGS;  Surgeon: Leslye Peer, MD;  Location: [facility] ENDOSCOPY;  Service: Pulmonary;;   BRONCHIAL NEEDLE ASPIRATION BIOPSY  09/25/2022   Procedure: BRONCHIAL NEEDLE ASPIRATION BIOPSIES;  Surgeon: Leslye Peer, MD;  [facility] ENDOSCOPY;  Service: Pulmonary;;   cataract surgery Bilateral 11/2020   COLONOSCOPY  06/08/2021   07/27/2016   DENTAL SURGERY     FIDUCIAL MARKER PLACEMENT  09/25/2022   Procedure: FIDUCIAL MARKER PLACEMENT;  Surgeon: Leslye Peer, MD;  Location: [facility] ENDOSCOPY;  Service: Pulmonary;;   HERNIA REPAIR     Umbilical & Inguinal   left knee surgery     MELANOMA EXCISION     facial   neck and shoulder injection     POLYPECTOMY     WISDOM TOOTH EXTRACTION      MEDICATIONS:  HYDROcodone-acetaminophen (NORCO) 5-325 MG tablet   ondansetron (ZOFRAN) 8 MG tablet   pantoprazole (PROTONIX) 40 MG tablet   prochlorperazine (COMPAZINE) 10 MG tablet   rosuvastatin (CRESTOR) 20 MG tablet   timolol (TIMOPTIC) 0.5 % ophthalmic solution   No current facility-administered medications for this encounter.    Shonna Chock, PA-C Surgical Short Stay/Anesthesiology [facility] Phone [phone] [facility] Phone [phone] 02/25/2023 4:49 PM

## 2023-03-02 ENCOUNTER — Other Ambulatory Visit: Payer: Self-pay | Admitting: Family Medicine

## 2023-03-02 ENCOUNTER — Other Ambulatory Visit: Payer: Self-pay | Admitting: General Practice

## 2023-03-02 DIAGNOSIS — R1013 Epigastric pain: Secondary | ICD-10-CM

## 2023-03-02 DIAGNOSIS — K219 Gastro-esophageal reflux disease without esophagitis: Secondary | ICD-10-CM

## 2023-03-02 DIAGNOSIS — K2970 Gastritis, unspecified, without bleeding: Secondary | ICD-10-CM

## 2023-03-02 DIAGNOSIS — E782 Mixed hyperlipidemia: Secondary | ICD-10-CM

## 2023-03-04 ENCOUNTER — Other Ambulatory Visit: Payer: Self-pay | Admitting: Family Medicine

## 2023-03-04 ENCOUNTER — Other Ambulatory Visit: Payer: Self-pay | Admitting: Gastroenterology

## 2023-03-04 DIAGNOSIS — E782 Mixed hyperlipidemia: Secondary | ICD-10-CM

## 2023-03-04 DIAGNOSIS — K219 Gastro-esophageal reflux disease without esophagitis: Secondary | ICD-10-CM

## 2023-03-04 DIAGNOSIS — R1013 Epigastric pain: Secondary | ICD-10-CM

## 2023-03-04 DIAGNOSIS — K2970 Gastritis, unspecified, without bleeding: Secondary | ICD-10-CM

## 2023-03-04 NOTE — Pre-Procedure Instructions (Signed)
Surgical Instructions   Your procedure is scheduled on Thursday, November 21st. Report to [facility] Main Entrance "A" at 05:30 A.M., then check in with the Admitting office. Any questions or running late day of surgery: call [phone]  Questions prior to your surgery date: call [phone], Monday-Friday, 8am-4pm. If you experience any cold or flu symptoms such as cough, fever, chills, shortness of breath, etc. between now and your scheduled surgery, please notify us at the above number.     Remember:  Do not eat or drink after midnight the night before your surgery    Take these medicines the morning of surgery with A SIP OF WATER  pantoprazole (PROTONIX)  rosuvastatin (CRESTOR)  timolol (TIMOPTIC) eye drop  May take these medicines IF NEEDED: acetaminophen (TYLENOL)    One week prior to surgery, STOP taking any Aspirin (unless otherwise instructed by your surgeon) Aleve, Naproxen, Ibuprofen, Motrin, Advil, Goody's, BC's, all herbal medications, fish oil, and non-prescription vitamins.                     Do NOT Smoke (Tobacco/Vaping) for 24 hours prior to your procedure.  If you use a CPAP at night, you may bring your mask/headgear for your overnight stay.   You will be asked to remove any contacts, glasses, piercing's, hearing aid's, dentures/partials prior to surgery. Please bring cases for these items if needed.    Patients discharged the day of surgery will not be allowed to drive home, and someone needs to stay with them for 24 hours.  SURGICAL WAITING ROOM VISITATION Patients may have no more than 2 support people in the waiting area - these visitors may rotate.   Pre-op nurse will coordinate an appropriate time for 1 ADULT support person, who may not rotate, to accompany patient in pre-op.  Children under the age of 40 must have an adult with them who is not the patient and must remain in the main waiting area with an adult.  If the patient needs to stay at the  hospital during part of their recovery, the visitor guidelines for inpatient rooms apply.  Please refer to the [facility] website for the visitor guidelines for any additional information.   If you received a COVID test during your pre-op visit  it is requested that you wear a mask when out in public, stay away from anyone that may not be feeling well and notify your surgeon if you develop symptoms. If you have been in contact with anyone that has tested positive in the last 10 days please notify you surgeon.      Pre-operative CHG Bathing Instructions   You can play a key role in reducing the risk of infection after surgery. Your skin needs to be as free of germs as possible. You can reduce the number of germs on your skin by washing with CHG (chlorhexidine gluconate) soap before surgery. CHG is an antiseptic soap that kills germs and continues to kill germs even after washing.   DO NOT use if you have an allergy to chlorhexidine/CHG or antibacterial soaps. If your skin becomes reddened or irritated, stop using the CHG and notify one of our RNs at [phone].              TAKE A SHOWER THE NIGHT BEFORE SURGERY AND THE DAY OF SURGERY    Please keep in mind the following:  DO NOT shave, including legs and underarms, 48 hours prior to surgery.   You may  shave your face before/day of surgery.  Place clean sheets on your bed the night before surgery Use a clean washcloth (not used since being washed) for each shower. DO NOT sleep with pet's night before surgery.  CHG Shower Instructions:  Wash your face and private area with normal soap. If you choose to wash your hair, wash first with your normal shampoo.  After you use shampoo/soap, rinse your hair and body thoroughly to remove shampoo/soap residue.  Turn the water OFF and apply half the bottle of CHG soap to a CLEAN washcloth.  Apply CHG soap ONLY FROM YOUR NECK DOWN TO YOUR TOES (washing for 3-5 minutes)  DO NOT use CHG soap on face,  private areas, open wounds, or sores.  Pay special attention to the area where your surgery is being performed.  If you are having back surgery, having someone wash your back for you may be helpful. Wait 2 minutes after CHG soap is applied, then you may rinse off the CHG soap.  Pat dry with a clean towel  Put on clean pajamas    Additional instructions for the day of surgery: DO NOT APPLY any lotions, deodorants, cologne, or perfumes.   Do not wear jewelry or makeup Do not wear nail polish, gel polish, artificial nails, or any other type of covering on natural nails (fingers and toes) Do not bring valuables to the hospital. [facility] is not responsible for valuables/personal belongings. Put on clean/comfortable clothes.  Please brush your teeth.  Ask your nurse before applying any prescription medications to the skin.

## 2023-03-05 ENCOUNTER — Other Ambulatory Visit: Payer: Self-pay

## 2023-03-05 ENCOUNTER — Encounter (HOSPITAL_COMMUNITY)
Admission: RE | Admit: 2023-03-05 | Discharge: 2023-03-05 | Disposition: A | Discharge status: Home / Self Care | Payer: Medicare PPO | Source: Ambulatory Visit | Attending: Thoracic Surgery (Cardiothoracic Vascular Surgery) | Admitting: Thoracic Surgery (Cardiothoracic Vascular Surgery)

## 2023-03-05 ENCOUNTER — Ambulatory Visit (HOSPITAL_COMMUNITY)
Admission: RE | Admit: 2023-03-05 | Discharge: 2023-03-05 | Disposition: A | Discharge status: Home / Self Care | Payer: Medicare PPO | Source: Ambulatory Visit | Attending: Thoracic Surgery (Cardiothoracic Vascular Surgery) | Admitting: Thoracic Surgery (Cardiothoracic Vascular Surgery)

## 2023-03-05 ENCOUNTER — Encounter (HOSPITAL_COMMUNITY): Payer: Self-pay

## 2023-03-05 DIAGNOSIS — K219 Gastro-esophageal reflux disease without esophagitis: Secondary | ICD-10-CM | POA: Diagnosis present

## 2023-03-05 DIAGNOSIS — Z85118 Personal history of other malignant neoplasm of bronchus and lung: Secondary | ICD-10-CM | POA: Diagnosis not present

## 2023-03-05 DIAGNOSIS — E785 Hyperlipidemia, unspecified: Secondary | ICD-10-CM | POA: Diagnosis present

## 2023-03-05 DIAGNOSIS — N189 Chronic kidney disease, unspecified: Secondary | ICD-10-CM | POA: Insufficient documentation

## 2023-03-05 DIAGNOSIS — Z85820 Personal history of malignant melanoma of skin: Secondary | ICD-10-CM | POA: Diagnosis not present

## 2023-03-05 DIAGNOSIS — D6800 Von Willebrand disease, unspecified: Secondary | ICD-10-CM | POA: Insufficient documentation

## 2023-03-05 DIAGNOSIS — R000 Tachycardia, unspecified: Secondary | ICD-10-CM | POA: Diagnosis not present

## 2023-03-05 DIAGNOSIS — J439 Emphysema, unspecified: Secondary | ICD-10-CM | POA: Diagnosis present

## 2023-03-05 DIAGNOSIS — Z860100 Personal history of colon polyps, unspecified: Secondary | ICD-10-CM | POA: Diagnosis not present

## 2023-03-05 DIAGNOSIS — Z79899 Other long term (current) drug therapy: Secondary | ICD-10-CM | POA: Insufficient documentation

## 2023-03-05 DIAGNOSIS — R918 Other nonspecific abnormal finding of lung field: Secondary | ICD-10-CM | POA: Diagnosis not present

## 2023-03-05 DIAGNOSIS — J4489 Other specified chronic obstructive pulmonary disease: Secondary | ICD-10-CM | POA: Diagnosis present

## 2023-03-05 DIAGNOSIS — J449 Chronic obstructive pulmonary disease, unspecified: Secondary | ICD-10-CM | POA: Diagnosis not present

## 2023-03-05 DIAGNOSIS — Z1152 Encounter for screening for COVID-19: Secondary | ICD-10-CM | POA: Insufficient documentation

## 2023-03-05 DIAGNOSIS — J939 Pneumothorax, unspecified: Secondary | ICD-10-CM | POA: Diagnosis not present

## 2023-03-05 DIAGNOSIS — J9382 Other air leak: Secondary | ICD-10-CM | POA: Diagnosis not present

## 2023-03-05 DIAGNOSIS — Z4682 Encounter for fitting and adjustment of non-vascular catheter: Secondary | ICD-10-CM | POA: Diagnosis not present

## 2023-03-05 DIAGNOSIS — H409 Unspecified glaucoma: Secondary | ICD-10-CM | POA: Diagnosis present

## 2023-03-05 DIAGNOSIS — Z01818 Encounter for other preprocedural examination: Secondary | ICD-10-CM | POA: Insufficient documentation

## 2023-03-05 DIAGNOSIS — Z87891 Personal history of nicotine dependence: Secondary | ICD-10-CM | POA: Diagnosis not present

## 2023-03-05 DIAGNOSIS — T797XXA Traumatic subcutaneous emphysema, initial encounter: Secondary | ICD-10-CM | POA: Diagnosis not present

## 2023-03-05 DIAGNOSIS — C3411 Malignant neoplasm of upper lobe, right bronchus or lung: Secondary | ICD-10-CM | POA: Insufficient documentation

## 2023-03-05 DIAGNOSIS — J984 Other disorders of lung: Secondary | ICD-10-CM | POA: Diagnosis not present

## 2023-03-05 DIAGNOSIS — J95811 Postprocedural pneumothorax: Secondary | ICD-10-CM | POA: Diagnosis not present

## 2023-03-05 DIAGNOSIS — C3491 Malignant neoplasm of unspecified part of right bronchus or lung: Secondary | ICD-10-CM

## 2023-03-05 DIAGNOSIS — J8410 Pulmonary fibrosis, unspecified: Secondary | ICD-10-CM | POA: Diagnosis not present

## 2023-03-05 DIAGNOSIS — Z9889 Other specified postprocedural states: Secondary | ICD-10-CM | POA: Insufficient documentation

## 2023-03-05 DIAGNOSIS — R059 Cough, unspecified: Secondary | ICD-10-CM | POA: Diagnosis not present

## 2023-03-05 DIAGNOSIS — I2510 Atherosclerotic heart disease of native coronary artery without angina pectoris: Secondary | ICD-10-CM | POA: Diagnosis present

## 2023-03-05 DIAGNOSIS — Z9221 Personal history of antineoplastic chemotherapy: Secondary | ICD-10-CM | POA: Diagnosis not present

## 2023-03-05 DIAGNOSIS — C7971 Secondary malignant neoplasm of right adrenal gland: Secondary | ICD-10-CM | POA: Diagnosis present

## 2023-03-05 DIAGNOSIS — J850 Gangrene and necrosis of lung: Secondary | ICD-10-CM | POA: Diagnosis not present

## 2023-03-05 DIAGNOSIS — J60 Coalworker's pneumoconiosis: Secondary | ICD-10-CM | POA: Diagnosis present

## 2023-03-05 DIAGNOSIS — M2550 Pain in unspecified joint: Secondary | ICD-10-CM | POA: Diagnosis present

## 2023-03-05 DIAGNOSIS — D62 Acute posthemorrhagic anemia: Secondary | ICD-10-CM | POA: Diagnosis not present

## 2023-03-05 HISTORY — DX: Von Willebrand disease, unspecified: D68.00

## 2023-03-05 HISTORY — DX: Unspecified hearing loss, unspecified ear: H91.90

## 2023-03-05 LAB — CBC
HCT: 43 % (ref 39.0–52.0)
Hemoglobin: 14.7 g/dL (ref 13.0–17.0)
MCH: 31.6 pg (ref 26.0–34.0)
MCHC: 34.2 g/dL (ref 30.0–36.0)
MCV: 92.5 fL (ref 80.0–100.0)
Platelets: 242 10*3/uL (ref 150–400)
RBC: 4.65 MIL/uL (ref 4.22–5.81)
RDW: 12.8 % (ref 11.5–15.5)
WBC: 9.7 10*3/uL (ref 4.0–10.5)
nRBC: 0 % (ref 0.0–0.2)

## 2023-03-05 LAB — URINALYSIS, ROUTINE W REFLEX MICROSCOPIC
Bilirubin Urine: NEGATIVE
Glucose, UA: NEGATIVE mg/dL
Hgb urine dipstick: NEGATIVE
Ketones, ur: NEGATIVE mg/dL
Leukocytes,Ua: NEGATIVE
Nitrite: NEGATIVE
Protein, ur: NEGATIVE mg/dL
Specific Gravity, Urine: 1.015 (ref 1.005–1.030)
pH: 5 (ref 5.0–8.0)

## 2023-03-05 LAB — COMPREHENSIVE METABOLIC PANEL
ALT: 18 U/L (ref 0–44)
AST: 19 U/L (ref 15–41)
Albumin: 4.4 g/dL (ref 3.5–5.0)
Alkaline Phosphatase: 51 U/L (ref 38–126)
Anion gap: 8 (ref 5–15)
BUN: 15 mg/dL (ref 8–23)
CO2: 24 mmol/L (ref 22–32)
Calcium: 9.8 mg/dL (ref 8.9–10.3)
Chloride: 106 mmol/L (ref 98–111)
Creatinine, Ser: 1.19 mg/dL (ref 0.61–1.24)
GFR, Estimated: >60 mL/min (ref >60)
Glucose, Bld: 119 mg/dL — ABNORMAL HIGH (ref 70–99)
Potassium: 3.6 mmol/L (ref 3.5–5.1)
Sodium: 138 mmol/L (ref 135–145)
Total Bilirubin: 0.7 mg/dL (ref <1.2)
Total Protein: 7.6 g/dL (ref 6.5–8.1)

## 2023-03-05 LAB — PROTIME-INR
INR: 1.1 (ref 0.8–1.2)
Prothrombin Time: 14.8 s (ref 11.4–15.2)

## 2023-03-05 LAB — TYPE AND SCREEN
ABO/RH(D): O POS
Antibody Screen: NEGATIVE

## 2023-03-05 LAB — SURGICAL PCR SCREEN
MRSA, PCR: NEGATIVE
Staphylococcus aureus: NEGATIVE

## 2023-03-05 LAB — APTT: aPTT: 38 s — ABNORMAL HIGH (ref 24–36)

## 2023-03-05 NOTE — Progress Notes (Signed)
PCP - Saralyn Pilar, PA-C  Cardiologist - was seen by Dr. Asher Muir  EP-no  Endocrine-no  Pulm-no  Chest x-ray - 03/05/23  EKG - 03/05/23  Stress Test - 05/10/21  ECHO - 05/22/21  Cardiac Cath - 02/05/2000  AICD-no PM-no LOOP-no  Nerve Stimulator-no  Dialysis-no  Sleep Study - no CPAP - no  LABS-CBC, CMP,  PT/PTT, CBC, UAQ  ASA-no  ERAS-no  HA1C-na GLP-1-no Fasting Blood Sugar - na Checks Blood Sugar __0___ times a day  Anesthesia-  Pt denies having chest pain, sob, or fever at this time. All instructions explained to the pt, with a verbal understanding of the material. Pt agrees to go over the instructions while at home for a better understanding. The opportunity to ask questions was provided.

## 2023-03-06 LAB — SARS CORONAVIRUS 2 (TAT 6-24 HRS): SARS Coronavirus 2: NEGATIVE

## 2023-03-06 NOTE — Anesthesia Preprocedure Evaluation (Addendum)
Anesthesia Evaluation  Patient identified by MRN, date of birth, ID band Patient awake    Reviewed: Allergy & Precautions, NPO status , Patient's Chart, lab work & pertinent test results  History of Anesthesia Complications (+) PONV and history of anesthetic complications  Airway Mallampati: II  TM Distance: >3 FB Neck ROM: Full    Dental  (+) Dental Advisory Given   Pulmonary asthma , COPD, former smoker  NSCLC    Pulmonary exam normal        Cardiovascular negative cardio ROS Normal cardiovascular exam     Neuro/Psych  PSYCHIATRIC DISORDERS  Depression    negative neurological ROS     GI/Hepatic ,GERD  Medicated and Controlled,,(+)     substance abuse  marijuana use  Endo/Other  negative endocrine ROS    Renal/GU negative Renal ROS     Musculoskeletal  (+) Arthritis ,    Abdominal   Peds  Hematology  vWD    Anesthesia Other Findings   Reproductive/Obstetrics                             Anesthesia Physical Anesthesia Plan  ASA: 3  Anesthesia Plan: General   Post-op Pain Management: Tylenol PO (pre-op)*   Induction: Intravenous  PONV Risk Score and Plan: 3 and Treatment may vary due to age or medical condition, Ondansetron and Dexamethasone  Airway Management Planned: Double Lumen EBT  Additional Equipment: Arterial line  Intra-op Plan:   Post-operative Plan: Extubation in OR  Informed Consent: I have reviewed the patients History and Physical, chart, labs and discussed the procedure including the risks, benefits and alternatives for the proposed anesthesia with the patient or authorized representative who has indicated his/her understanding and acceptance.     Dental advisory given  Plan Discussed with: CRNA and Anesthesiologist  Anesthesia Plan Comments: (PAT note written by Shonna Chock, PA-C. History of von Willebrand's disease. Dr. Dorris Fetch discussed  recommendations with HEM-[facility] Dr. Arbutus Ped who did not recommend any additional preoperative labs and was in agreement with TXA prior to surgery an DDAVP 0.3 mcg/kg 30 minutes before surgery. Dr. Dorris Fetch is planning to enter orders on the day of surgery. )        Anesthesia Quick Evaluation

## 2023-03-07 ENCOUNTER — Inpatient Hospital Stay (HOSPITAL_COMMUNITY)
Admission: RE | Admit: 2023-03-07 | Discharge: 2023-03-12 | DRG: 164 | Disposition: A | Discharge status: Home / Self Care | Payer: Medicare PPO | Attending: Thoracic Surgery (Cardiothoracic Vascular Surgery) | Admitting: Thoracic Surgery (Cardiothoracic Vascular Surgery)

## 2023-03-07 ENCOUNTER — Inpatient Hospital Stay (HOSPITAL_COMMUNITY): Payer: Medicare PPO | Admitting: Vascular Surgery

## 2023-03-07 ENCOUNTER — Other Ambulatory Visit: Payer: Self-pay

## 2023-03-07 ENCOUNTER — Inpatient Hospital Stay (HOSPITAL_COMMUNITY): Payer: Medicare PPO

## 2023-03-07 ENCOUNTER — Encounter (HOSPITAL_COMMUNITY)
Admission: RE | Disposition: A | Discharge status: Home / Self Care | Payer: Self-pay | Source: Home / Self Care | Attending: Thoracic Surgery (Cardiothoracic Vascular Surgery)

## 2023-03-07 ENCOUNTER — Encounter (HOSPITAL_COMMUNITY): Payer: Self-pay | Admitting: Thoracic Surgery (Cardiothoracic Vascular Surgery)

## 2023-03-07 DIAGNOSIS — J4489 Other specified chronic obstructive pulmonary disease: Secondary | ICD-10-CM | POA: Diagnosis present

## 2023-03-07 DIAGNOSIS — J439 Emphysema, unspecified: Secondary | ICD-10-CM | POA: Diagnosis present

## 2023-03-07 DIAGNOSIS — Z860100 Personal history of colon polyps, unspecified: Secondary | ICD-10-CM

## 2023-03-07 DIAGNOSIS — Z01818 Encounter for other preprocedural examination: Principal | ICD-10-CM

## 2023-03-07 DIAGNOSIS — H409 Unspecified glaucoma: Secondary | ICD-10-CM | POA: Diagnosis present

## 2023-03-07 DIAGNOSIS — R059 Cough, unspecified: Secondary | ICD-10-CM | POA: Diagnosis not present

## 2023-03-07 DIAGNOSIS — C3411 Malignant neoplasm of upper lobe, right bronchus or lung: Secondary | ICD-10-CM | POA: Diagnosis present

## 2023-03-07 DIAGNOSIS — M2550 Pain in unspecified joint: Secondary | ICD-10-CM | POA: Diagnosis present

## 2023-03-07 DIAGNOSIS — Z87891 Personal history of nicotine dependence: Secondary | ICD-10-CM | POA: Diagnosis not present

## 2023-03-07 DIAGNOSIS — C7971 Secondary malignant neoplasm of right adrenal gland: Secondary | ICD-10-CM | POA: Diagnosis present

## 2023-03-07 DIAGNOSIS — T797XXA Traumatic subcutaneous emphysema, initial encounter: Secondary | ICD-10-CM | POA: Diagnosis not present

## 2023-03-07 DIAGNOSIS — J850 Gangrene and necrosis of lung: Secondary | ICD-10-CM | POA: Diagnosis not present

## 2023-03-07 DIAGNOSIS — J939 Pneumothorax, unspecified: Secondary | ICD-10-CM | POA: Diagnosis not present

## 2023-03-07 DIAGNOSIS — Z9221 Personal history of antineoplastic chemotherapy: Secondary | ICD-10-CM | POA: Diagnosis not present

## 2023-03-07 DIAGNOSIS — J60 Coalworker's pneumoconiosis: Secondary | ICD-10-CM | POA: Diagnosis present

## 2023-03-07 DIAGNOSIS — Z85820 Personal history of malignant melanoma of skin: Secondary | ICD-10-CM

## 2023-03-07 DIAGNOSIS — I2510 Atherosclerotic heart disease of native coronary artery without angina pectoris: Secondary | ICD-10-CM | POA: Diagnosis present

## 2023-03-07 DIAGNOSIS — J449 Chronic obstructive pulmonary disease, unspecified: Secondary | ICD-10-CM | POA: Diagnosis not present

## 2023-03-07 DIAGNOSIS — J9382 Other air leak: Secondary | ICD-10-CM | POA: Diagnosis not present

## 2023-03-07 DIAGNOSIS — C3490 Malignant neoplasm of unspecified part of unspecified bronchus or lung: Secondary | ICD-10-CM | POA: Diagnosis present

## 2023-03-07 DIAGNOSIS — R911 Solitary pulmonary nodule: Secondary | ICD-10-CM | POA: Diagnosis present

## 2023-03-07 DIAGNOSIS — R000 Tachycardia, unspecified: Secondary | ICD-10-CM | POA: Diagnosis not present

## 2023-03-07 DIAGNOSIS — E785 Hyperlipidemia, unspecified: Secondary | ICD-10-CM | POA: Diagnosis present

## 2023-03-07 DIAGNOSIS — J8410 Pulmonary fibrosis, unspecified: Secondary | ICD-10-CM | POA: Diagnosis not present

## 2023-03-07 DIAGNOSIS — Z79899 Other long term (current) drug therapy: Secondary | ICD-10-CM | POA: Diagnosis not present

## 2023-03-07 DIAGNOSIS — Z4682 Encounter for fitting and adjustment of non-vascular catheter: Secondary | ICD-10-CM | POA: Diagnosis not present

## 2023-03-07 DIAGNOSIS — J95811 Postprocedural pneumothorax: Secondary | ICD-10-CM | POA: Diagnosis not present

## 2023-03-07 DIAGNOSIS — D6800 Von Willebrand disease, unspecified: Secondary | ICD-10-CM | POA: Diagnosis present

## 2023-03-07 DIAGNOSIS — K219 Gastro-esophageal reflux disease without esophagitis: Secondary | ICD-10-CM | POA: Diagnosis present

## 2023-03-07 DIAGNOSIS — D62 Acute posthemorrhagic anemia: Secondary | ICD-10-CM | POA: Diagnosis not present

## 2023-03-07 DIAGNOSIS — J984 Other disorders of lung: Secondary | ICD-10-CM | POA: Diagnosis not present

## 2023-03-07 DIAGNOSIS — C3491 Malignant neoplasm of unspecified part of right bronchus or lung: Secondary | ICD-10-CM

## 2023-03-07 DIAGNOSIS — R918 Other nonspecific abnormal finding of lung field: Secondary | ICD-10-CM | POA: Diagnosis not present

## 2023-03-07 HISTORY — PX: LYMPH NODE DISSECTION: SHX5087

## 2023-03-07 HISTORY — PX: INTERCOSTAL NERVE BLOCK: SHX5021

## 2023-03-07 LAB — POCT I-STAT 7, (LYTES, BLD GAS, ICA,H+H)
Acid-base deficit: 4 mmol/L — ABNORMAL HIGH (ref 0.0–2.0)
Bicarbonate: 24.2 mmol/L (ref 20.0–28.0)
Calcium, Ion: 1.25 mmol/L (ref 1.15–1.40)
HCT: 38 % — ABNORMAL LOW (ref 39.0–52.0)
Hemoglobin: 12.9 g/dL — ABNORMAL LOW (ref 13.0–17.0)
O2 Saturation: 95 %
Potassium: 3.7 mmol/L (ref 3.5–5.1)
Sodium: 142 mmol/L (ref 135–145)
TCO2: 26 mmol/L (ref 22–32)
pCO2 arterial: 54.2 mm[Hg] — ABNORMAL HIGH (ref 32–48)
pH, Arterial: 7.258 — ABNORMAL LOW (ref 7.35–7.45)
pO2, Arterial: 87 mm[Hg] (ref 83–108)

## 2023-03-07 LAB — ABO/RH: ABO/RH(D): O POS

## 2023-03-07 SURGERY — LOBECTOMY, LUNG, ROBOT-ASSISTED, USING VATS
Anesthesia: General | Site: Chest | Laterality: Right

## 2023-03-07 MED ORDER — HYDROMORPHONE HCL 1 MG/ML IJ SOLN
INTRAMUSCULAR | Status: AC
  Filled 2023-03-07: qty 1

## 2023-03-07 MED ORDER — PHENYLEPHRINE HCL-NACL 20-0.9 MG/250ML-% IV SOLN
INTRAVENOUS | Status: DC | PRN
  Administered 2023-03-07: 50 ug/min via INTRAVENOUS

## 2023-03-07 MED ORDER — ROCURONIUM BROMIDE 10 MG/ML (PF) SYRINGE
PREFILLED_SYRINGE | INTRAVENOUS | Status: DC | PRN
  Administered 2023-03-07: 20 mg via INTRAVENOUS
  Administered 2023-03-07: 80 mg via INTRAVENOUS
  Administered 2023-03-07: 20 mg via INTRAVENOUS

## 2023-03-07 MED ORDER — TIMOLOL MALEATE 0.5 % OP SOLN
1.0000 [drp] | Freq: Every morning | OPHTHALMIC | Status: DC
  Administered 2023-03-07 – 2023-03-12 (×6): 1 [drp] via OPHTHALMIC
  Filled 2023-03-07: qty 5

## 2023-03-07 MED ORDER — BUPIVACAINE HCL (PF) 0.5 % IJ SOLN
INTRAMUSCULAR | Status: AC
  Filled 2023-03-07: qty 30

## 2023-03-07 MED ORDER — TRANEXAMIC ACID-NACL 1000-0.7 MG/100ML-% IV SOLN
1000.0000 mg | INTRAVENOUS | Status: AC
  Administered 2023-03-07: 1000 mg via INTRAVENOUS
  Filled 2023-03-07: qty 100

## 2023-03-07 MED ORDER — OXYCODONE HCL 5 MG/5ML PO SOLN: 5.0000 mg | Freq: Once | ORAL | Status: DC | PRN

## 2023-03-07 MED ORDER — MIDAZOLAM HCL 2 MG/2ML IJ SOLN
INTRAMUSCULAR | Status: AC
  Filled 2023-03-07: qty 2

## 2023-03-07 MED ORDER — OXYCODONE HCL 5 MG PO TABS
5.0000 mg | ORAL_TABLET | Freq: Once | ORAL | Status: DC | PRN
Start: 2023-03-07 — End: 2023-03-07

## 2023-03-07 MED ORDER — PHENYLEPHRINE 80 MCG/ML (10ML) SYRINGE FOR IV PUSH (FOR BLOOD PRESSURE SUPPORT)
PREFILLED_SYRINGE | INTRAVENOUS | Status: DC | PRN
  Administered 2023-03-07: 80 ug via INTRAVENOUS

## 2023-03-07 MED ORDER — FENTANYL CITRATE PF 50 MCG/ML IJ SOSY: 25.0000 ug | PREFILLED_SYRINGE | INTRAMUSCULAR | Status: DC | PRN

## 2023-03-07 MED ORDER — PROPOFOL 500 MG/50ML IV EMUL
INTRAVENOUS | Status: DC | PRN
  Administered 2023-03-07: 50 ug/kg/min via INTRAVENOUS

## 2023-03-07 MED ORDER — ONDANSETRON HCL 4 MG/2ML IJ SOLN: 4.0000 mg | Freq: Four times a day (QID) | INTRAMUSCULAR | Status: DC | PRN

## 2023-03-07 MED ORDER — FENTANYL CITRATE (PF) 250 MCG/5ML IJ SOLN
INTRAMUSCULAR | Status: DC | PRN
  Administered 2023-03-07: 50 ug via INTRAVENOUS
  Administered 2023-03-07 (×2): 100 ug via INTRAVENOUS

## 2023-03-07 MED ORDER — ACETAMINOPHEN 500 MG PO TABS
1000.0000 mg | ORAL_TABLET | Freq: Once | ORAL | Status: AC
  Administered 2023-03-07: 1000 mg via ORAL
  Filled 2023-03-07: qty 2

## 2023-03-07 MED ORDER — CEFAZOLIN SODIUM-DEXTROSE 2-4 GM/100ML-% IV SOLN
2.0000 g | INTRAVENOUS | Status: AC
  Administered 2023-03-07: 2 g via INTRAVENOUS
  Filled 2023-03-07: qty 100

## 2023-03-07 MED ORDER — LIDOCAINE 2% (20 MG/ML) 5 ML SYRINGE
INTRAMUSCULAR | Status: DC | PRN
  Administered 2023-03-07: 60 mg via INTRAVENOUS

## 2023-03-07 MED ORDER — ORAL CARE MOUTH RINSE: 15.0000 mL | Freq: Once | OROMUCOSAL | Status: AC

## 2023-03-07 MED ORDER — SODIUM CHLORIDE 0.9% IV SOLUTION
INTRAVENOUS | Status: AC | PRN
  Administered 2023-03-07: 1000 mL via INTRAVENOUS

## 2023-03-07 MED ORDER — BUPIVACAINE HCL (PF) 0.5 % IJ SOLN
INTRAMUSCULAR | Status: AC
  Filled 2023-03-07: qty 10

## 2023-03-07 MED ORDER — BUPIVACAINE LIPOSOME 1.3 % IJ SUSP
INTRAMUSCULAR | Status: AC
  Filled 2023-03-07: qty 20

## 2023-03-07 MED ORDER — PROPOFOL 10 MG/ML IV BOLUS
INTRAVENOUS | Status: AC
  Filled 2023-03-07: qty 20

## 2023-03-07 MED ORDER — ACETAMINOPHEN 160 MG/5ML PO SOLN: 1000.0000 mg | Freq: Four times a day (QID) | ORAL | Status: AC

## 2023-03-07 MED ORDER — ONDANSETRON HCL 4 MG/2ML IJ SOLN: 4.0000 mg | Freq: Once | INTRAMUSCULAR | Status: DC | PRN

## 2023-03-07 MED ORDER — POTASSIUM CHLORIDE IN NACL 20-0.9 MEQ/L-% IV SOLN
INTRAVENOUS | Status: AC
  Filled 2023-03-07 (×2): qty 1000

## 2023-03-07 MED ORDER — SODIUM CHLORIDE FLUSH 0.9 % IV SOLN: INTRAVENOUS | Status: DC | PRN

## 2023-03-07 MED ORDER — GLYCOPYRROLATE 0.2 MG/ML IJ SOLN
INTRAMUSCULAR | Status: DC | PRN
  Administered 2023-03-07: .1 mg via INTRAVENOUS

## 2023-03-07 MED ORDER — ALBUMIN HUMAN 5 % IV SOLN: INTRAVENOUS | Status: DC | PRN

## 2023-03-07 MED ORDER — ACETAMINOPHEN 500 MG PO TABS
1000.0000 mg | ORAL_TABLET | Freq: Four times a day (QID) | ORAL | Status: AC
  Administered 2023-03-07 – 2023-03-12 (×13): 1000 mg via ORAL
  Filled 2023-03-07 (×13): qty 2

## 2023-03-07 MED ORDER — PROPOFOL 1000 MG/100ML IV EMUL
INTRAVENOUS | Status: AC
  Filled 2023-03-07: qty 100

## 2023-03-07 MED ORDER — PROPOFOL 10 MG/ML IV BOLUS
INTRAVENOUS | Status: DC | PRN
  Administered 2023-03-07: 150 mg via INTRAVENOUS

## 2023-03-07 MED ORDER — GABAPENTIN 300 MG PO CAPS
300.0000 mg | ORAL_CAPSULE | Freq: Two times a day (BID) | ORAL | Status: DC
  Administered 2023-03-10 – 2023-03-12 (×4): 300 mg via ORAL
  Filled 2023-03-07 (×4): qty 1

## 2023-03-07 MED ORDER — 0.9 % SODIUM CHLORIDE (POUR BTL) OPTIME
TOPICAL | Status: DC | PRN
  Administered 2023-03-07: 1000 mL

## 2023-03-07 MED ORDER — FENTANYL CITRATE (PF) 100 MCG/2ML IJ SOLN: 25.0000 ug | INTRAMUSCULAR | Status: DC | PRN

## 2023-03-07 MED ORDER — LACTATED RINGERS IV SOLN
INTRAVENOUS | Status: DC | PRN
Start: 2023-03-07 — End: 2023-03-07

## 2023-03-07 MED ORDER — FENTANYL CITRATE (PF) 250 MCG/5ML IJ SOLN
INTRAMUSCULAR | Status: AC
  Filled 2023-03-07: qty 5

## 2023-03-07 MED ORDER — CHLORHEXIDINE GLUCONATE 0.12 % MT SOLN: 15.0000 mL | Freq: Once | OROMUCOSAL | Status: AC

## 2023-03-07 MED ORDER — CHLORHEXIDINE GLUCONATE CLOTH 2 % EX PADS: 6.0000 | MEDICATED_PAD | Freq: Every day | CUTANEOUS | Status: DC

## 2023-03-07 MED ORDER — MIDAZOLAM HCL 2 MG/2ML IJ SOLN
INTRAMUSCULAR | Status: DC | PRN
  Administered 2023-03-07: 2 mg via INTRAVENOUS

## 2023-03-07 MED ORDER — BISACODYL 5 MG PO TBEC
10.0000 mg | DELAYED_RELEASE_TABLET | Freq: Every day | ORAL | Status: DC
  Administered 2023-03-08 – 2023-03-10 (×3): 10 mg via ORAL
  Filled 2023-03-07 (×5): qty 2

## 2023-03-07 MED ORDER — DEXAMETHASONE SODIUM PHOSPHATE 10 MG/ML IJ SOLN
INTRAMUSCULAR | Status: DC | PRN
  Administered 2023-03-07: 10 mg via INTRAVENOUS

## 2023-03-07 MED ORDER — SENNOSIDES-DOCUSATE SODIUM 8.6-50 MG PO TABS
1.0000 | ORAL_TABLET | Freq: Every day | ORAL | Status: DC
  Administered 2023-03-07 – 2023-03-11 (×4): 1 via ORAL
  Filled 2023-03-07 (×4): qty 1

## 2023-03-07 MED ORDER — KETOROLAC TROMETHAMINE 15 MG/ML IJ SOLN
15.0000 mg | Freq: Four times a day (QID) | INTRAMUSCULAR | Status: AC
Start: 2023-03-07 — End: 2023-03-09
  Administered 2023-03-07 – 2023-03-09 (×7): 15 mg via INTRAVENOUS
  Filled 2023-03-07 (×7): qty 1

## 2023-03-07 MED ORDER — CEFAZOLIN SODIUM-DEXTROSE 2-4 GM/100ML-% IV SOLN
2.0000 g | Freq: Three times a day (TID) | INTRAVENOUS | Status: AC
  Administered 2023-03-07 – 2023-03-08 (×2): 2 g via INTRAVENOUS
  Filled 2023-03-07 (×2): qty 100

## 2023-03-07 MED ORDER — ROSUVASTATIN CALCIUM 20 MG PO TABS
20.0000 mg | ORAL_TABLET | Freq: Every day | ORAL | Status: DC
  Administered 2023-03-07 – 2023-03-12 (×6): 20 mg via ORAL
  Filled 2023-03-07 (×6): qty 1

## 2023-03-07 MED ORDER — SUGAMMADEX SODIUM 200 MG/2ML IV SOLN
INTRAVENOUS | Status: DC | PRN
  Administered 2023-03-07: 400 mg via INTRAVENOUS

## 2023-03-07 MED ORDER — PANTOPRAZOLE SODIUM 40 MG PO TBEC
40.0000 mg | DELAYED_RELEASE_TABLET | Freq: Two times a day (BID) | ORAL | Status: DC
  Administered 2023-03-07 – 2023-03-12 (×10): 40 mg via ORAL
  Filled 2023-03-07 (×11): qty 1

## 2023-03-07 MED ORDER — ENOXAPARIN SODIUM 40 MG/0.4ML IJ SOSY: 40.0000 mg | PREFILLED_SYRINGE | INTRAMUSCULAR | Status: DC

## 2023-03-07 MED ORDER — TRAMADOL HCL 50 MG PO TABS: 50.0000 mg | ORAL_TABLET | Freq: Four times a day (QID) | ORAL | Status: DC | PRN

## 2023-03-07 MED ORDER — SODIUM CHLORIDE 0.9 % IV SOLN
0.3000 ug/kg | Freq: Once | INTRAVENOUS | Status: AC
  Administered 2023-03-07: 28.8 ug via INTRAVENOUS
  Filled 2023-03-07: qty 7.2

## 2023-03-07 MED ORDER — HEMOSTATIC AGENTS (NO CHARGE) OPTIME
TOPICAL | Status: DC | PRN
  Administered 2023-03-07: 2 via TOPICAL

## 2023-03-07 MED ORDER — ONDANSETRON HCL 4 MG/2ML IJ SOLN
INTRAMUSCULAR | Status: DC | PRN
  Administered 2023-03-07: 8 mg via INTRAVENOUS

## 2023-03-07 MED ORDER — GABAPENTIN 300 MG PO CAPS
300.0000 mg | ORAL_CAPSULE | Freq: Every day | ORAL | Status: AC
Start: 2023-03-07 — End: 2023-03-09
  Administered 2023-03-07 – 2023-03-09 (×3): 300 mg via ORAL
  Filled 2023-03-07 (×3): qty 1

## 2023-03-07 MED ORDER — OXYCODONE HCL 5 MG PO TABS
5.0000 mg | ORAL_TABLET | ORAL | Status: DC | PRN
  Administered 2023-03-07 – 2023-03-12 (×25): 10 mg via ORAL
  Administered 2023-03-12: 5 mg via ORAL
  Administered 2023-03-12: 10 mg via ORAL
  Filled 2023-03-07 (×27): qty 2

## 2023-03-07 MED ORDER — CHLORHEXIDINE GLUCONATE CLOTH 2 % EX PADS
6.0000 | MEDICATED_PAD | Freq: Every day | CUTANEOUS | Status: DC
  Administered 2023-03-07 – 2023-03-10 (×4): 6 via TOPICAL

## 2023-03-07 MED ORDER — LACTATED RINGERS IV SOLN: INTRAVENOUS | Status: DC

## 2023-03-07 MED ORDER — CHLORHEXIDINE GLUCONATE 0.12 % MT SOLN
OROMUCOSAL | Status: AC
  Administered 2023-03-07: 15 mL via OROMUCOSAL
  Filled 2023-03-07: qty 15

## 2023-03-07 MED ORDER — HYDROMORPHONE HCL 1 MG/ML IJ SOLN
INTRAMUSCULAR | Status: DC | PRN
Start: 2023-03-07 — End: 2023-03-07
  Administered 2023-03-07 (×2): .5 mg via INTRAVENOUS

## 2023-03-07 SURGICAL SUPPLY — 80 items
BLADE CLIPPER SURG (BLADE) IMPLANT
CANISTER SUCT 3000ML PPV (MISCELLANEOUS) ×4 IMPLANT
CANNULA REDUCER 12-8 DVNC XI (CANNULA) ×4 IMPLANT
CATH THORACIC 28FR (CATHETERS) ×1 IMPLANT
CNTNR URN SCR LID CUP LEK RST (MISCELLANEOUS) ×20 IMPLANT
CONN ST 1/4X3/8 BEN (MISCELLANEOUS) IMPLANT
DEFOGGER SCOPE WARMER CLEARIFY (MISCELLANEOUS) ×2 IMPLANT
DERMABOND ADVANCED .7 DNX12 (GAUZE/BANDAGES/DRESSINGS) ×2 IMPLANT
DERMABOND ADVANCED .7 DNX6 (GAUZE/BANDAGES/DRESSINGS) ×1 IMPLANT
DRAIN CHANNEL 28F RND 3/8 FF (WOUND CARE) IMPLANT
DRAIN CHANNEL 32F RND 10.7 FF (WOUND CARE) IMPLANT
DRAPE ARM DVNC X/XI (DISPOSABLE) ×8 IMPLANT
DRAPE COLUMN DVNC XI (DISPOSABLE) ×2 IMPLANT
DRAPE CV SPLIT W-CLR ANES SCRN (DRAPES) ×2 IMPLANT
DRAPE HALF SHEET 40X57 (DRAPES) ×2 IMPLANT
DRAPE INCISE IOBAN 66X45 STRL (DRAPES) IMPLANT
DRAPE SURG ORHT 6 SPLT 77X108 (DRAPES) ×2 IMPLANT
ELECT BLADE 6.5 EXT (BLADE) ×2 IMPLANT
ELECT REM PT RETURN 9FT ADLT (ELECTROSURGICAL) ×2
ELECTRODE REM PT RTRN 9FT ADLT (ELECTROSURGICAL) ×2 IMPLANT
FORCEPS BPLR FENES DVNC XI (FORCEP) ×1 IMPLANT
FORCEPS BPLR LNG DVNC XI (INSTRUMENTS) ×1 IMPLANT
GAUZE KITTNER 4X5 RF (MISCELLANEOUS) ×4 IMPLANT
GAUZE SPONGE 4X4 12PLY STRL (GAUZE/BANDAGES/DRESSINGS) ×2 IMPLANT
GLOVE SS BIOGEL STRL SZ 7.5 (GLOVE) ×4 IMPLANT
GLOVE SURG POLYISO LF SZ8 (GLOVE) ×2 IMPLANT
GOWN STRL REUS W/ TWL LRG LVL3 (GOWN DISPOSABLE) ×4 IMPLANT
GOWN STRL REUS W/ TWL XL LVL3 (GOWN DISPOSABLE) ×4 IMPLANT
GOWN STRL REUS W/TWL 2XL LVL3 (GOWN DISPOSABLE) ×2 IMPLANT
GRASPER TIP-UP FEN DVNC XI (INSTRUMENTS) ×1 IMPLANT
HEMOSTAT SURGICEL 2X14 (HEMOSTASIS) ×6 IMPLANT
IRRIGATION STRYKERFLOW (MISCELLANEOUS) ×2 IMPLANT
IRRIGATOR STRYKERFLOW (MISCELLANEOUS) ×2
KIT BASIN OR (CUSTOM PROCEDURE TRAY) ×2 IMPLANT
KIT SUCTION CATH 14FR (SUCTIONS) IMPLANT
KIT TURNOVER KIT B (KITS) ×2 IMPLANT
NDL HYPO 25GX1X1/2 BEV (NEEDLE) ×1 IMPLANT
NDL SPNL 22GX3.5 QUINCKE BK (NEEDLE) ×1 IMPLANT
NEEDLE HYPO 25GX1X1/2 BEV (NEEDLE) ×2 IMPLANT
NEEDLE SPNL 22GX3.5 QUINCKE BK (NEEDLE) ×2 IMPLANT
NS IRRIG 1000ML POUR BTL (IV SOLUTION) ×4 IMPLANT
PACK CHEST (CUSTOM PROCEDURE TRAY) ×2 IMPLANT
PAD ARMBOARD 7.5X6 YLW CONV (MISCELLANEOUS) ×4 IMPLANT
PORT ACCESS TROCAR AIRSEAL 12 (TROCAR) ×2 IMPLANT
RELOAD STAPLE 45 2.5 WHT DVNC (STAPLE) IMPLANT
RELOAD STAPLE 45 3.5 BLU DVNC (STAPLE) IMPLANT
RELOAD STAPLE 45 4.3 GRN DVNC (STAPLE) IMPLANT
RELOAD STAPLER 2.5X45 WHT DVNC (STAPLE) ×4 IMPLANT
RELOAD STAPLER 3.5X45 BLU DVNC (STAPLE) ×6 IMPLANT
RELOAD STAPLER 4.3X45 GRN DVNC (STAPLE) ×14 IMPLANT
SCISSORS LAP 5X35 DISP (ENDOMECHANICALS) IMPLANT
SEAL UNIV 5-12 XI (MISCELLANEOUS) ×8 IMPLANT
SEALER SYNCHRO 8 IS4000 DVNC (MISCELLANEOUS) ×1 IMPLANT
SET TRI-LUMEN FLTR TB AIRSEAL (TUBING) ×2 IMPLANT
SOL ELECTROSURG ANTI STICK (MISCELLANEOUS) ×2
SOLUTION ELECTROSURG ANTI STCK (MISCELLANEOUS) ×2 IMPLANT
SPONGE INTESTINAL PEANUT (DISPOSABLE) IMPLANT
SPONGE TONSIL 1 RF SGL (DISPOSABLE) IMPLANT
STAPLER 45 SUREFORM CVD DVNC (STAPLE) ×1 IMPLANT
STAPLER RELOAD 2.5X45 WHT DVNC (STAPLE) ×4
STAPLER RELOAD 3.5X45 BLU DVNC (STAPLE) ×6
STAPLER RELOAD 4.3X45 GRN DVNC (STAPLE) ×14
SUT PROLENE 4-0 RB1 .5 CRCL 36 (SUTURE) IMPLANT
SUT SILK 1 MH (SUTURE) ×2 IMPLANT
SUT SILK 2 0 SH (SUTURE) ×2 IMPLANT
SUT SILK 2 0SH CR/8 30 (SUTURE) IMPLANT
SUT SILK 3 0SH CR/8 30 (SUTURE) IMPLANT
SUT VIC AB 1 CTX36XBRD ANBCTR (SUTURE) ×2 IMPLANT
SUT VIC AB 2-0 CTX 36 (SUTURE) ×2 IMPLANT
SUT VIC AB 3-0 X1 27 (SUTURE) ×4 IMPLANT
SUT VICRYL 0 TIES 12 18 (SUTURE) ×2 IMPLANT
SUT VICRYL 0 UR6 27IN ABS (SUTURE) ×4 IMPLANT
SUT VICRYL 2 TP 1 (SUTURE) IMPLANT
SYR 20CC LL (SYRINGE) ×4 IMPLANT
SYSTEM RETRIEVAL ANCHOR 15 (MISCELLANEOUS) ×1 IMPLANT
SYSTEM SAHARA CHEST DRAIN ATS (WOUND CARE) ×2 IMPLANT
TAPE CLOTH 4X10 WHT NS (GAUZE/BANDAGES/DRESSINGS) ×2 IMPLANT
TOWEL GREEN STERILE (TOWEL DISPOSABLE) ×2 IMPLANT
TRAY FOLEY MTR SLVR 16FR STAT (SET/KITS/TRAYS/PACK) ×2 IMPLANT
WATER STERILE IRR 1000ML POUR (IV SOLUTION) ×4 IMPLANT

## 2023-03-07 NOTE — Anesthesia Procedure Notes (Addendum)
Procedure Name: Intubation Date/Time: 03/07/2023 8:32 AM  Performed by: Camillia Herter, CRNAPre-anesthesia Checklist: Patient identified, Emergency Drugs available, Suction available and Patient being monitored Patient Re-evaluated:Patient Re-evaluated prior to induction Oxygen Delivery Method: Circle system utilized Preoxygenation: Pre-oxygenation with 100% oxygen Induction Type: IV induction Ventilation: Two handed mask ventilation required and Oral airway inserted - appropriate to patient size Laryngoscope Size: Mac and 4 Grade View: Grade I Tube type: Oral Endobronchial tube: Double lumen EBT and Left and 39 Fr Number of attempts: 1 Airway Equipment and Method: Stylet and Oral airway Placement Confirmation: ETT inserted through vocal cords under direct vision, positive ETCO2 and breath sounds checked- equal and bilateral Tube secured with: Tape Dental Injury: Teeth and Oropharynx as per pre-operative assessment

## 2023-03-07 NOTE — Interval H&P Note (Signed)
History and Physical Interval Note:  03/07/2023 8:05 AM  Brandon Schultz  has presented today for surgery, with the diagnosis of Right Upper Lobe LUNG CANCER.  The various methods of treatment have been discussed with the patient and family. After consideration of risks, benefits and other options for treatment, the patient has consented to  Procedure(s): XI ROBOTIC ASSISTED THORACOSCOPY-RIGHT UPPER LOBECTOMY, possible pneumonectomy (Right) possible PNEUMONECTOMY (N/A) as a surgical intervention.  The patient's history has been reviewed, patient examined, no change in status, stable for surgery.  I have reviewed the patient's chart and labs.  Questions were answered to the patient's satisfaction.     [name]

## 2023-03-07 NOTE — Discharge Summary (Addendum)
[address]       [place] [address]             [phone]    Physician Discharge Summary  Patient ID: Brandon Schultz MRN: [id number] DOB/AGE: 11-10-1950 72 y.o.  Admit date: 03/07/2023 Discharge date: 03/12/2023  Admission Diagnoses:  Patient Active Problem List   Diagnosis Date Noted   Right upper lobe pulmonary nodule 03/07/2023   Encounter for antineoplastic immunotherapy 01/02/2023   Encounter for antineoplastic chemotherapy 01/02/2023   Non-small cell carcinoma of right lung, stage 2 (HCC) 11/14/2022   Lung cancer (HCC) 10/08/2022   Pulmonary nodule 07/06/2022   COPD (chronic obstructive pulmonary disease) (HCC) 07/06/2022   Nocturia 03/05/2019   Urinary frequency 03/05/2019   Acute bilateral low back pain without sciatica 05/20/2018   Urinary dribbling 03/27/2018   History of BPH 03/27/2018   Colon polyps    Persistent cough 05/16/2016   Elevated blood sugar 05/16/2016   Tinnitus, bilateral 05/09/2016   Sensorineural hearing loss (SNHL), bilateral 03/29/2016   Medicare annual wellness visit, subsequent 08/18/2014   Hemorrhoids 07/08/2012   Mixed hyperlipidemia 07/28/2008   DEPRESSION 07/28/2008   Chronic bronchitis (HCC) 07/28/2008   GERD 07/28/2008   Osteoarthritis 07/28/2008   SYNCOPE AND COLLAPSE 07/28/2008     Discharge Diagnoses:  Patient Active Problem List   Diagnosis Date Noted   Right upper lobe pulmonary nodule 03/07/2023   Encounter for antineoplastic immunotherapy 01/02/2023   Encounter for antineoplastic chemotherapy 01/02/2023   Non-small cell carcinoma of right lung, stage 2 (HCC) 11/14/2022   Lung cancer (HCC) 10/08/2022   Pulmonary nodule 07/06/2022   COPD (chronic obstructive pulmonary disease) (HCC) 07/06/2022   Nocturia 03/05/2019   Urinary frequency 03/05/2019   Acute bilateral low back pain without sciatica 05/20/2018   Urinary dribbling 03/27/2018   History of BPH 03/27/2018   Colon polyps     Persistent cough 05/16/2016   Elevated blood sugar 05/16/2016   Tinnitus, bilateral 05/09/2016   Sensorineural hearing loss (SNHL), bilateral 03/29/2016   Medicare annual wellness visit, subsequent 08/18/2014   Hemorrhoids 07/08/2012   Mixed hyperlipidemia 07/28/2008   DEPRESSION 07/28/2008   Chronic bronchitis (HCC) 07/28/2008   GERD 07/28/2008   Osteoarthritis 07/28/2008   SYNCOPE AND COLLAPSE 07/28/2008     Discharged Condition: Stable  HPI: This is a 72 year old former smoker with a past medical history significant for tobacco abuse, COPD, coronary calcification on CT, hyperlipidemia, melanoma, von Willebrand's disorder, chronic kidney disease, reflux, glaucoma, and arthritis.  80-pack-year history of smoking prior to quitting in 2016.  Found to have a lung nodule on a low-dose CT in March.  Nodule was persistent on follow-up.  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   PET/CT 10/11/2022 showed the nodule was hypermetabolic.  There also was a hypermetabolic right hilar node and an adrenal adenoma.  Findings consistent with stage IIb (T1, N1) non-small cell carcinoma.   He was referred to Dr. Arbutus Ped and had neoadjuvant chemoimmunotherapy.  He tolerated the chemo portion well.  He had a lot of arthralgias with the immunotherapy.  Recently had a follow-up scan which showed a partial response.   He lost about 5 or 6 pounds during his treatment.  Also had some hair loss.  That is starting to grow back.  No chest pain, pressure, tightness, shortness of breath. Dr. Dorris Fetch discussed matters regarding potential resectability and possible need for pneumonectomy. Potential risks, benefits, and complications of the surgery were  discussed with the patient and he agreed to proceed with surgery. He does have adequate reserve to tolerate that should it be necessary.   Hospital Course: Patient underwent a Xi right thoracoscopy, RUL, LN dissection, and intercostal  nerve block ribs 3 through 10. He was extubated and transported from the OR to PACU in stable condition. Aline and foley were removed early in post op course. Chest tube remained to water seal and there was an air leak. Daily chest x rays were obtained and showed a right apical space. CXR on 11/24 showed increased right pneumothorax so chest tube was placed back to suction. He was given Mucinex to help with cough and Xopenex for wheezing. CXR 11/25 showed right lung to be mostly re expanded (probable trace right apical pneumothorax, subcutaneous emphysema). Chest tube was placed back to water seal on 11/25. CXR on 11/26 showed no pneumothorax. Chest tube was removed on 11/26. Same day chest x ray showed a small right apical pneumothorax, stable subcutaneous emphysema.  . He has been tolerating a diet. He has been ambulating on room air. All wounds are clean, dry, healing without signs of infection. As discussed with Dr. Dorris Fetch, patient was discharged home.  Consults: None  Significant Diagnostic Studies:   Narrative & Impression  CLINICAL DATA:  Right-sided pneumothorax. Status post chest tube removal.   EXAM: PORTABLE CHEST 1 VIEW   COMPARISON:  Earlier radiograph dated 03/12/2023.   FINDINGS: Interval removal of the right-sided chest tube. Small right pneumothorax measures approximately 1 cm in thickness. The left lung is clear. Stable cardiac silhouette. No acute osseous pathology. Right chest wall soft tissue emphysema similar or increased since the prior radiograph.   IMPRESSION: Interval removal of the right-sided chest tube. Small right pneumothorax.     Electronically Signed   By: Elgie Collard M.D.   On: 03/12/2023 17:02     Narrative & Impression  CLINICAL DATA:  Pneumothorax.   EXAM: PORTABLE CHEST 1 VIEW   COMPARISON:  03/07/2023   FINDINGS: Right chest tube is stable with the tip near the right lung apex. Again noted is a pneumothorax along the lateral  right lung apex. Pneumothorax may be slightly enlarged. Volume loss in the right lung. Left lung remains clear. Heart and mediastinum are grossly stable.   IMPRESSION: 1. Right pneumothorax appears to be slightly enlarged. Stable position of the right chest tube. 2. Volume loss in the right hemithorax.     Electronically Signed   By: Richarda Overlie M.D.   On: 03/08/2023 07:33    Treatments: Surgery:  Xi robotic assisted right upper lobectomy, lymph node dissection, and intercostal nerve blocks levels 3 through 10 by Dr. Dorris Fetch on 03/07/2023.  Pathology: FINAL MICROSCOPIC DIAGNOSIS:  A. LYMPH NODE, LEVEL 7 #2, EXCISION: One benign lymph node, negative for carcinoma (0/1)  B. RIGHT LUNG, UPPER LOBE, LOBECTOMY: Negative for residual carcinoma (ypT0) Tumor bed measuring 2.1 cm in greatest dimension showing fibrosis, necrosis and calcification with associated chronic and granulomatous inflammation Adjacent focal organizing pneumonia and reactive/therapy related epithelial changes Emphysematous changes with bleb Benign bronchial and vascular margins Three benign lymph nodes, negative for carcinoma (0/3)  C. LYMPH NODE, LEVEL 9, EXCISION: One benign lymph node, negative for carcinoma (0/1)  D. LYMPH NODE, LEVEL 9 #2, EXCISION: One benign lymph node, negative for carcinoma (0/1)  E. LYMPH NODE, LEVEL 7 #1, EXCISION: One benign lymph node, negative for carcinoma (0/1)  F. LYMPH NODE, LEVEL 10, EXCISION: One benign lymph node, negative  for carcinoma (0/1)  G. LYMPH NODE, LEVEL 11, EXCISION: One benign lymph node, negative for carcinoma (0/1)  H. LYMPH NODE, LEVEL 10 #2, EXCISION: One benign lymph node, negative for carcinoma (0/1)  I. LYMPH NODE, 4R, EXCISION: One benign lymph node, negative for carcinoma (0/1)  J. LYMPH NODE, LEVEL 10#3, EXCISION: One benign lymph node, negative for carcinoma (0/1)  K. LYMPH NODE, LEVEL 12, EXCISION: One benign lymph node,  negative for carcinoma (0/1)  L. LYMPH NODE, LEVEL 11#2, EXCISION: One benign lymph node, negative for carcinoma (0/1)  M. LYMPH NODE, LEVEL 12#2, EXCISION: One benign lymph node, negative for carcinoma (0/1)  N. LYMPH NODE, LEVEL 12#3, EXCISION: One benign lymph node, negative for carcinoma (0/1)   Discharge Exam: Blood pressure (!) 99/58, pulse 82, temperature 97.9 F (36.6 C), temperature source Oral, resp. rate 20, height 5\' 11"  (1.803 m), weight 96.6 kg, SpO2 90%.  Cardiovascular:Slightly tachycardic Pulmonary: Expiratory wheezing Abdomen: Soft, non tender, bowel sounds present. Extremities: No lower extremity edema. Wounds: Clean and dry.  No erythema or signs of infection.   Discharge Medications:    Allergies as of 03/12/2023   No Known Allergies      Medication List     TAKE these medications    acetaminophen 650 MG CR tablet Commonly known as: TYLENOL Take 1,300 mg by mouth every 8 (eight) hours as needed for pain.   gabapentin 300 MG capsule Commonly known as: Neurontin Take 1 capsule (300 mg total) by mouth at bedtime.   guaiFENesin 600 MG 12 hr tablet Commonly known as: MUCINEX Take 2 tablets (1,200 mg total) by mouth 2 (two) times daily as needed for to loosen phlegm or cough.   oxyCODONE 5 MG immediate release tablet Commonly known as: Oxy IR/ROXICODONE Take 1 tablet (5 mg total) by mouth every 6 (six) hours as needed for severe pain (pain score 7-10).   pantoprazole 40 MG tablet Commonly known as: PROTONIX TAKE 1 TABLET BY MOUTH 2 TIMES DAILY.   polyethylene glycol powder 17 GM/SCOOP powder Commonly known as: GLYCOLAX/MIRALAX Take 1 Container by mouth daily as needed for mild constipation.   rosuvastatin 20 MG tablet Commonly known as: CRESTOR TAKE ONE TABLET BY MOUTH EVERY DAY   timolol 0.5 % ophthalmic solution Commonly known as: TIMOPTIC Place 1 drop into the left eye every morning.        Follow-up Information      [name], MD. Go on 03/27/2023.   Specialty: Cardiothoracic Surgery Why: Appointment time is at 4:00 pm Contact information: [address] [employer] [place] [address] [phone]         [facility]. Go on 03/27/2023.   Why: Please arrive by 3:00 pm in order to have PA/LAT CXR taken PRIOR to office appointment with Dr. Joneen Roach information: [address] [place] [address]                Signed:  Ardelle Balls, PA-C 03/13/2023, 8:30 AM

## 2023-03-07 NOTE — Brief Op Note (Signed)
03/07/2023  12:10 PM  PATIENT:  Brandon Schultz  72 y.o. male  PRE-OPERATIVE DIAGNOSIS:  Right Upper Lobe LUNG CANCER  POST-OPERATIVE DIAGNOSIS:  Right Upper Lobe LUNG CANCER  PROCEDURE:XI ROBOTIC ASSISTED RIGHT THORACOSCOPY,RIGHT UPPER LOBECTOMY, INTERCOSTAL NERVE BLOCK, RIGHT CHEST  LYMPH NODE DISSECTION  SURGEON:  Surgeons and Role:    [name], MD - Primary  PHYSICIAN ASSISTANT: Doree Fudge PA-C  ANESTHESIA:   general  EBL:  100 mL   BLOOD ADMINISTERED:none  DRAINS:  28 French chest tube placed in the right pleural space    LOCAL MEDICATIONS USED:  OTHER Exparel  SPECIMEN:  Source of Specimen:  RUL, multiple lymph nodes  DISPOSITION OF SPECIMEN:   Pathology. Frozen of one lymph node negative for cancer. Bronchial and pulmonary margins negative for cancer  COUNTS CORRECT:  YES  DICTATION: .Dragon Dictation  PLAN OF CARE: Admit to inpatient   PATIENT DISPOSITION:  PACU - hemodynamically stable.   Delay start of Pharmacological VTE agent (>24hrs) due to surgical blood loss or risk of bleeding: yes

## 2023-03-07 NOTE — Hospital Course (Addendum)
HPI: This is a 72 year old former smoker with a past medical history significant for tobacco abuse, COPD, coronary calcification on CT, hyperlipidemia, melanoma, von Willebrand's disorder, chronic kidney disease, reflux, glaucoma, and arthritis.  80-pack-year history of smoking prior to quitting in 2016.  Found to have a lung nodule on a low-dose CT in March.  Nodule was persistent on follow-up.  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   PET/CT 10/11/2022 showed the nodule was hypermetabolic.  There also was a hypermetabolic right hilar node and an adrenal adenoma.  Findings consistent with stage IIb (T1, N1) non-small cell carcinoma.   He was referred to Dr. Arbutus Ped and had neoadjuvant chemoimmunotherapy.  He tolerated the chemo portion well.  He had a lot of arthralgias with the immunotherapy.  Recently had a follow-up scan which showed a partial response.   He lost about 5 or 6 pounds during his treatment.  Also had some hair loss.  That is starting to grow back.  No chest pain, pressure, tightness, shortness of breath. Dr. Dorris Fetch discussed matters regarding potential resectability and possible need for pneumonectomy. Potential risks, benefits, and complications of the surgery were discussed with the patient and he agreed to proceed with surgery. He does have adequate reserve to tolerate that should it be necessary.   Hospital Course: Patient underwent a Xi right thoracoscopy, RUL, LN dissection, and intercostal nerve block ribs 3 through 10. He was extubated and transported from the OR to PACU in stable condition. Aline and foley were removed early in post op course. Chest tube remained to water seal and there was an air leak. Daily chest x rays were obtained and showed a right apical space. Chest tube was removed on 11/**. Same day chest x ray showed **. He has been tolerating a diet. He has been ambulating on room air. All wounds are clean, dry, healing without  signs of infection.

## 2023-03-07 NOTE — Transfer of Care (Signed)
Immediate Anesthesia Transfer of Care Note  Patient: Brandon Schultz  Procedure(s) Performed: XI ROBOTIC ASSISTED THORACOSCOPY-RIGHT UPPER LOBECTOMY (Right: Chest) INTERCOSTAL NERVE BLOCK, RIGHT CHEST (Right: Chest) LYMPH NODE DISSECTION, RIGHT LUNG (Right: Chest)  Patient Location: PACU  Anesthesia Type:General  Level of Consciousness: pateint uncooperative  Airway & Oxygen Therapy: Patient Spontanous Breathing and Patient connected to face mask oxygen  Post-op Assessment: Report given to RN and Post -op Vital signs reviewed and stable  Post vital signs: Reviewed and stable  Last Vitals:  Vitals Value Taken Time  BP 117/87 03/07/23 1147  Temp    Pulse 61 03/07/23 1155  Resp 17 03/07/23 1155  SpO2 94 % 03/07/23 1155  Vitals shown include unfiled device data.  Last Pain:  Vitals:   03/07/23 0617  TempSrc:   PainSc: 0-No pain         Complications: No notable events documented.

## 2023-03-07 NOTE — Anesthesia Procedure Notes (Addendum)
Arterial Line Insertion Start/End11/21/2024 7:20 AM, 03/07/2023 7:30 AM Performed by: Camillia Herter, CRNA, CRNA  Patient location: Pre-op. Preanesthetic checklist: patient identified, IV checked, site marked, risks and benefits discussed, surgical consent, monitors and equipment checked, pre-op evaluation, timeout performed and anesthesia consent Lidocaine 1% used for infiltration Right, radial was placed Catheter size: 20 G Hand hygiene performed  and maximum sterile barriers used  Allen's test indicative of satisfactory collateral circulation Attempts: 1 Procedure performed using ultrasound guided technique. Ultrasound Notes:anatomy identified, needle tip was noted to be adjacent to the nerve/plexus identified and no ultrasound evidence of intravascular and/or intraneural injection Following insertion, dressing applied and Biopatch. Post procedure assessment: normal  Patient tolerated the procedure well with no immediate complications.

## 2023-03-07 NOTE — Anesthesia Postprocedure Evaluation (Signed)
Anesthesia Post Note  Patient: Brandon Schultz  Procedure(s) Performed: XI ROBOTIC ASSISTED THORACOSCOPY-RIGHT UPPER LOBECTOMY (Right: Chest) INTERCOSTAL NERVE BLOCK, RIGHT CHEST (Right: Chest) LYMPH NODE DISSECTION, RIGHT LUNG (Right: Chest)     Patient location during evaluation: PACU Anesthesia Type: General Level of consciousness: awake and alert Pain management: pain level controlled Vital Signs Assessment: post-procedure vital signs reviewed and stable Respiratory status: spontaneous breathing, nonlabored ventilation and respiratory function stable Cardiovascular status: stable and blood pressure returned to baseline Anesthetic complications: no   No notable events documented.  Last Vitals:  Vitals:   03/07/23 1245 03/07/23 1300  BP: 132/81 137/78  Pulse: 69 70  Resp: 14 12  Temp:    SpO2: 97% 97%    Last Pain:  Vitals:   03/07/23 0617  TempSrc:   PainSc: 0-No pain                 Beryle Lathe

## 2023-03-07 NOTE — Op Note (Signed)
Brandon Schultz, Brandon Schultz MEDICAL RECORD NO: [id number] ACCOUNT NO: [id number] DATE OF BIRTH: 1950-11-13 FACILITY: [facility] LOCATION: [facility] PHYSICIAN: Salvatore Decent. Dorris Fetch, MD  Operative Report   DATE OF PROCEDURE: 03/07/2023  PREOPERATIVE DIAGNOSIS:  Stage IIB, non-small cell carcinoma of the lung status post neoadjuvant chemoimmunotherapy.  POSTOPERATIVE DIAGNOSIS:  Stage IIB, non-small cell carcinoma of the lung status post neoadjuvant chemoimmunotherapy.  PROCEDURE:   Xi robotic assisted right upper lobectomy,  Lymph node dissection, and  Intercostal nerve blocks levels 3 through 10.  SURGEON:  Salvatore Decent. Dorris Fetch, MD  ASSISTANT:  Doree Fudge, PA  Experienced assistance was necessary for this case due to surgical complexity.  Doree Fudge assisted with port placement, robot docking and undocking, instrument exchange, specimen retrieval, suctioning and wound closure.  CLINICAL NOTE:  Brandon Schultz is a 72 year old gentleman with a history of tobacco use who was found to have a lung nodule on a low-dose CT earlier this year.  On followup, the nodule was persistent.  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  PET showed activity in the right hilar node.  Clinical stage was IIB (T1, N1).  He underwent neoadjuvant chemoimmunotherapy and had a good partial response.  He now presents for surgical resection.  The indications, risks, benefits, and alternatives were discussed in detail with the patient.  He understood and accepted the risks and agreed to proceed.  OPERATIVE NOTE:  Brandon Schultz was brought to the operating room on 03/07/2023.  He had induction of general anesthesia and was intubated with a double-lumen endotracheal tube.  Intravenous antibiotics were administered.  A Foley catheter was placed.  Sequential compression devices were placed on the calves for DVT prophylaxis.  He was placed in a left lateral decubitus position.  A Bair Hugger was  placed for active warming.  The right chest was prepped and draped in the usual sterile fashion.  Single lung ventilation of the left lung was initiated and was tolerated well throughout the procedure.  A timeout was performed.  A solution containing 20 mL of liposomal bupivacaine, 30 mL of 0.5% bupivacaine, and 50 mL of saline was prepared.  This solution was used for local at the incision sites as well as for the intercostal nerve blocks.  An incision was made in the eighth interspace in approximately the mid axillary line and an 8-mm robotic port was inserted.  The thoracoscope was advanced into the chest.  After confirming intrapleural placement, carbon dioxide was insufflated per protocol.  A 12-mm robotic port was placed in the seventh interspace anterior to the camera port.  Intercostal nerve blocks were performed from the third to the tenth interspace by injecting 10 mL of bupivacaine solution into a subpleural plane at each level.  A 12-mm AirSeal port was placed in the 10th interspace posterolaterally and then two additional 8th interspace robotic ports were placed.  The robot was deployed.  The camera arm was docked.  Targeting was performed.  The remaining arms were docked.  The robotic instruments were inserted with thoracoscopic visualization.  Initial inspection revealed no pleural effusion and no abnormality of the parietal pleura.  There was diffuse anthracosis of the lungs.  The major fissure was nearly complete.  The lower lobe was retracted superiorly and the lymph node dissection was initiated.  The inferior ligament was divided with bipolar cautery and level 9 nodes were removed.  The lung then was retracted anteriorly and the pleural reflection was divided at the hilum posteriorly.  The subcarinal space  was explored.  There were multiple nodes in that area as well as relatively large bronchial arteries.  The larger node was worrisome and was sent for frozen section, but subsequently returned  with no tumor seen.  After achieving hemostasis, Surgicel was packed into the subcarinal space.  The pleura then was incised exposing the space between the right upper lobe bronchus and the bronchus intermedius and the level 11 node was removed.  The upper lobe then was retracted  inferiorly.  The pleural reflection was divided between the azygos vein and the hilum.  There was a relatively large but otherwise benign appearing level 10 lymph node, which was removed.  There was a level 12 node along the pulmonary artery that was removed as well.  A second level 10 node was smaller and was also removed.  The pleura then was incised over the mediastinum superior to the azygos vein and a level 4R node was removed.  The lung was retracted posteriorly.  The pleural reflection was divided between the hilum and the phrenic nerve.  No cautery was used in the vicinity of the phrenic nerve.  The superior pulmonary vein branches were identified and dissected out.  Next, attention was turned to the fissure.  The pleura  was incised overlying the pulmonary artery and the fissure was completed working posteriorly using bipolar cautery.  Additional nodes, both level 11 and 12 nodes were removed.  Dissection was initiated working posteriorly along the main pulmonary artery.  A small posterior ascending branch was present.  This was too small to comfortably pass a stapler.  It was divided with the SynchroSeal device with good hemostasis.  Division of the minor fissure then was initiated.  The middle lobe vein was identified and  preserved.  The fissure was indistinct anteriorly and the stapler was biased to the middle lobe to make sure the entire upper lobe was removed.  Sequential firings of the stapler were performed.  The superior pulmonary vein branches to the right upper lobe were encircled and divided using the robotic stapler and then the remainder of the fissure was completed working from posterior to anterior.  Additional  dissection and removal of nodes was performed and then the anterior apical trunk was encircled and divided using the robotic stapler.  The stapler was placed across the right upper lobe bronchus at its origin.  The stapler was closed.  The test inflation showed aeration of the lower and middle lobes.  The staple was fired transecting the right upper lobe bronchus.  The middle lobe was tacked to the lower lobe using the robotic stapler.  The vessel loop and sponges used during the dissection were removed.  The chest then was copiously irrigated with saline and test inflation to 30 cm of water revealed no air leak from the bronchial stump or the parenchyma.  The AirSeal port was removed.  A 15-mm endoscopic retrieval bag was placed into the chest.  The upper lobe was manipulated into the bag, which was then brought down to the inferior aspect of the chest.  The robotic instruments were removed and the robot was undocked.  The anterior seventh interspace incision was lengthened to 3 cm and the specimen was removed through it and sent for frozen section of the bronchial margin and the vascular margin, both of which returned with no tumor seen.  All port sites and staple lines were inspected for hemostasis.  It should be noted that the patient was given both DDAVP and tranexamic  acid at the beginning of the procedure due to his von Willebrand's.  A 28-French chest tube was placed through the original port incision and secured with a #1 silk suture.  Dual lung ventilation was resumed.  The remaining incisions were closed in standard fashion.  Dermabond was applied.  The chest tube was placed to a Pleur Evac on waterseal.  The patient was placed in a supine position.  He was extubated in the operating room and taken to the postanesthesia care unit in good condition.  All sponge, needle, and instrument counts were correct at the end of the procedure.   PUS D: 03/07/2023 5:36:20 pm T: 03/07/2023 7:54:00 pm  JOB:  [id number]/ [id number]

## 2023-03-07 NOTE — Discharge Instructions (Signed)
Robot-Assisted Thoracic Surgery, Care After The following information offers guidance on how to care for yourself after your procedure. Your health care provider may also give you more specific instructions. If you have problems or questions, contact your health care provider. What can I expect after the procedure? After the procedure, it is common to have: Some pain and aches in the area of your surgical incisions. Pain when breathing in (inhaling) and coughing. Tiredness (fatigue). Trouble sleeping. Constipation. Follow these instructions at home: Medicines Take over-the-counter and prescription medicines only as told by your health care provider. If you were prescribed an antibiotic medicine, take it as told by your health care provider. Do not stop taking the antibiotic even if you start to feel better. Talk with your health care provider about safe and effective ways to manage pain after your procedure. Pain management should fit your specific health needs. Take pain medicine before pain becomes severe. Relieving and controlling your pain will make breathing easier for you. Ask your health care provider if the medicine prescribed to you requires you to avoid driving or using machinery. Eating and drinking Follow instructions from your health care provider about eating or drinking restrictions. These will vary depending on what procedure you had. Your health care provider may recommend: A liquid diet or soft diet for the first few days. Meals that are smaller and more frequent. A diet of fruits, vegetables, whole grains, and low-fat proteins. Limiting foods that are high in fat and processed sugar, including fried or sweet foods. Incision care Follow instructions from your health care provider about how to take care of your incisions. Make sure you: Wash your hands with soap and water for at least 20 seconds before and after you change your bandage (dressing). If soap and water are not  available, use hand sanitizer. Change your dressing as told by your health care provider. Leave stitches (sutures), skin glue, or adhesive strips in place. These skin closures may need to stay in place for 2 weeks or longer. If adhesive strip edges start to loosen and curl up, you may trim the loose edges. Do not remove adhesive strips completely unless your health care provider tells you to do that. Check your incision area every day for signs of infection. Check for: Redness, swelling, or more pain. Fluid or blood. Warmth. Pus or a bad smell. Activity Return to your normal activities as told by your health care provider. Ask your health care provider what activities are safe for you. Ask your health care provider when it is safe for you to drive. Do not lift anything that is heavier than 10 lb (4.5 kg), or the limit that you are told, until your health care provider says that it is safe. Rest as told by your health care provider. Avoid sitting for a long time without moving. Get up to take short walks every 1-2 hours. This is important to improve blood flow and breathing. Ask for help if you feel weak or unsteady. Do exercises as told by your health care provider. Pneumonia prevention  Do deep breathing exercises and cough regularly as directed. This helps clear mucus and opens your lungs. Doing this helps prevent lung infection (pneumonia). If you were given an incentive spirometer, use it as told. An incentive spirometer is a tool that measures how well you are filling your lungs with each breath. Coughing may hurt less if you try to support your chest. This is called splinting. Try one of these when you  cough: Hold a pillow against your chest. Place the palms of both hands on top of your incision area. Do not use any products that contain nicotine or tobacco. These products include cigarettes, chewing tobacco, and vaping devices, such as e-cigarettes. If you need help quitting, ask your  health care provider. Avoid secondhand smoke. General instructions If you have a drainage tube: Follow instructions from your health care provider about how to take care of it. Do not travel by airplane after your tube is removed until your health care provider tells you it is safe. You may need to take these actions to prevent or treat constipation: Drink enough fluid to keep your urine pale yellow. Take over-the-counter or prescription medicines. Eat foods that are high in fiber, such as beans, whole grains, and fresh fruits and vegetables. Limit foods that are high in fat and processed sugars, such as fried or sweet foods. Keep all follow-up visits. This is important. Contact a health care provider if: You have redness, swelling, or more pain around an incision. You have fluid or blood coming from an incision. An incision feels warm to the touch. You have pus or a bad smell coming from an incision. You have a fever. You cannot eat or drink without vomiting. Your pain medicine is not controlling your pain. Get help right away if: You have chest pain. Your heart is beating quickly. You have trouble breathing. You have trouble speaking. You are confused. You feel weak or dizzy, or you faint. These symptoms may represent a serious problem that is an emergency. Do not wait to see if the symptoms will go away. Get medical help right away. Call your local emergency services (911 in the [place] Do not drive yourself to the hospital. Summary Talk with your health care provider about safe and effective ways to manage pain after your procedure. Pain management should fit your specific health needs. Return to your normal activities as told by your health care provider. Ask your health care provider what activities are safe for you. Do deep breathing exercises and cough regularly as directed. This helps to clear mucus and prevent pneumonia. If it hurts to cough, ease pain by holding a pillow  against your chest or by placing the palms of both hands over your incisions. This information is not intended to replace advice given to you by your health care provider. Make sure you discuss any questions you have with your health care provider. Document Revised: 12/24/2019 Document Reviewed: 12/25/2019 Elsevier Patient Education  2024 [employer].

## 2023-03-08 ENCOUNTER — Inpatient Hospital Stay (HOSPITAL_COMMUNITY): Payer: Medicare PPO

## 2023-03-08 ENCOUNTER — Encounter (HOSPITAL_COMMUNITY): Payer: Self-pay | Admitting: Thoracic Surgery (Cardiothoracic Vascular Surgery)

## 2023-03-08 LAB — BASIC METABOLIC PANEL WITH GFR
Anion gap: 10 (ref 5–15)
BUN: 17 mg/dL (ref 8–23)
CO2: 21 mmol/L — ABNORMAL LOW (ref 22–32)
Calcium: 8.5 mg/dL — ABNORMAL LOW (ref 8.9–10.3)
Chloride: 106 mmol/L (ref 98–111)
Creatinine, Ser: 1.05 mg/dL (ref 0.61–1.24)
GFR, Estimated: >60 mL/min
Glucose, Bld: 126 mg/dL — ABNORMAL HIGH (ref 70–99)
Potassium: 4.1 mmol/L (ref 3.5–5.1)
Sodium: 137 mmol/L (ref 135–145)

## 2023-03-08 LAB — CBC
HCT: 34.8 % — ABNORMAL LOW (ref 39.0–52.0)
Hemoglobin: 11.8 g/dL — ABNORMAL LOW (ref 13.0–17.0)
MCH: 31.6 pg (ref 26.0–34.0)
MCHC: 33.9 g/dL (ref 30.0–36.0)
MCV: 93 fL (ref 80.0–100.0)
Platelets: 182 10*3/uL (ref 150–400)
RBC: 3.74 MIL/uL — ABNORMAL LOW (ref 4.22–5.81)
RDW: 12.8 % (ref 11.5–15.5)
WBC: 14.6 10*3/uL — ABNORMAL HIGH (ref 4.0–10.5)
nRBC: 0 % (ref 0.0–0.2)

## 2023-03-08 LAB — SURGICAL PATHOLOGY

## 2023-03-08 MED ORDER — ENOXAPARIN SODIUM 40 MG/0.4ML IJ SOSY
40.0000 mg | PREFILLED_SYRINGE | INTRAMUSCULAR | Status: DC
  Administered 2023-03-08 – 2023-03-12 (×5): 40 mg via SUBCUTANEOUS
  Filled 2023-03-08 (×5): qty 0.4

## 2023-03-08 NOTE — Plan of Care (Signed)
  Problem: Education: Goal: Knowledge of General Education information will improve Description: Including pain rating scale, medication(s)/side effects and non-pharmacologic comfort measures Outcome: Progressing   Problem: Health Behavior/Discharge Planning: Goal: Ability to manage health-related needs will improve Outcome: Progressing   Problem: Clinical Measurements: Goal: Ability to maintain clinical measurements within normal limits will improve Outcome: Progressing Goal: Will remain free from infection Outcome: Progressing Goal: Respiratory complications will improve Outcome: Progressing Goal: Cardiovascular complication will be avoided Outcome: Progressing   Problem: Activity: Goal: Risk for activity intolerance will decrease Outcome: Progressing   Problem: Nutrition: Goal: Adequate nutrition will be maintained Outcome: Progressing   Problem: Coping: Goal: Level of anxiety will decrease Outcome: Progressing   Problem: Elimination: Goal: Will not experience complications related to bowel motility Outcome: Progressing Goal: Will not experience complications related to urinary retention Outcome: Progressing   Problem: Pain Management: Goal: General experience of comfort will improve Outcome: Progressing   Problem: Safety: Goal: Ability to remain free from injury will improve Outcome: Progressing   Problem: Skin Integrity: Goal: Risk for impaired skin integrity will decrease Outcome: Progressing   Problem: Education: Goal: Knowledge of disease or condition will improve Outcome: Progressing Goal: Knowledge of the prescribed therapeutic regimen will improve Outcome: Progressing   Problem: Activity: Goal: Risk for activity intolerance will decrease Outcome: Progressing   Problem: Cardiac: Goal: Will achieve and/or maintain hemodynamic stability Outcome: Progressing   Problem: Clinical Measurements: Goal: Postoperative complications will be avoided or  minimized Outcome: Progressing   Problem: Respiratory: Goal: Respiratory status will improve Outcome: Progressing   Problem: Pain Management: Goal: Pain level will decrease Outcome: Progressing   Problem: Skin Integrity: Goal: Wound healing without signs and symptoms infection will improve Outcome: Progressing

## 2023-03-08 NOTE — Progress Notes (Signed)
Mobility Specialist Progress Note:   03/08/23 1000  Oxygen Therapy  O2 Device Room Air  Mobility  Activity Ambulated with assistance in hallway  Level of Assistance Contact guard assist, steadying assist  Assistive Device None  Activity Response Tolerated well  Mobility Referral Yes  $Mobility charge 1 Mobility  Mobility Specialist Start Time (ACUTE ONLY) 0946  Mobility Specialist Stop Time (ACUTE ONLY) 0957  Mobility Specialist Time Calculation (min) (ACUTE ONLY) 11 min    Pre Mobility: 74 HR, 96% SpO2 Post Mobility:  69 HR,  97% SpO2  Pt received in bed, agreeable to mobility. Asymptomatic w/ no complaints throughout. Pt left in bed with call bell and all needs met. Family present.  D'Vante Earlene Plater Mobility Specialist Please contact via Special educational needs teacher or Rehab office at [phone]

## 2023-03-08 NOTE — Plan of Care (Signed)
  Problem: Education: Goal: Knowledge of General Education information will improve Description: Including pain rating scale, medication(s)/side effects and non-pharmacologic comfort measures Outcome: Progressing   Problem: Health Behavior/Discharge Planning: Goal: Ability to manage health-related needs will improve Outcome: Progressing   Problem: Clinical Measurements: Goal: Ability to maintain clinical measurements within normal limits will improve Outcome: Progressing Goal: Will remain free from infection Outcome: Progressing Goal: Diagnostic test results will improve Outcome: Progressing Goal: Respiratory complications will improve Outcome: Progressing Goal: Cardiovascular complication will be avoided Outcome: Progressing   Problem: Activity: Goal: Risk for activity intolerance will decrease Outcome: Progressing   Problem: Nutrition: Goal: Adequate nutrition will be maintained Outcome: Progressing   Problem: Coping: Goal: Level of anxiety will decrease Outcome: Progressing   Problem: Elimination: Goal: Will not experience complications related to bowel motility Outcome: Progressing Goal: Will not experience complications related to urinary retention Outcome: Progressing   Problem: Pain Management: Goal: General experience of comfort will improve Outcome: Progressing   Problem: Safety: Goal: Ability to remain free from injury will improve Outcome: Progressing   Problem: Skin Integrity: Goal: Risk for impaired skin integrity will decrease Outcome: Progressing   Problem: Education: Goal: Knowledge of disease or condition will improve Outcome: Progressing Goal: Knowledge of the prescribed therapeutic regimen will improve Outcome: Progressing   Problem: Activity: Goal: Risk for activity intolerance will decrease Outcome: Progressing   Problem: Cardiac: Goal: Will achieve and/or maintain hemodynamic stability Outcome: Progressing   Problem: Clinical  Measurements: Goal: Postoperative complications will be avoided or minimized Outcome: Progressing   Problem: Respiratory: Goal: Respiratory status will improve Outcome: Progressing   Problem: Pain Management: Goal: Pain level will decrease Outcome: Progressing   Problem: Skin Integrity: Goal: Wound healing without signs and symptoms infection will improve Outcome: Progressing

## 2023-03-08 NOTE — Progress Notes (Addendum)
      [address]       [name] [address]             [phone]       1 Day Post-Op Procedure(s) (LRB): XI ROBOTIC ASSISTED THORACOSCOPY-RIGHT UPPER LOBECTOMY (Right) INTERCOSTAL NERVE BLOCK, RIGHT CHEST (Right) LYMPH NODE DISSECTION, RIGHT LUNG (Right)  Subjective: Patient states pain is under good control. Wife at bedside.  Objective: Vital signs in last 24 hours: Temp:  [97.5 F (36.4 C)-98.2 F (36.8 C)] 98.2 F (36.8 C) (11/22 0400) Pulse Rate:  [65-95] 66 (11/22 0400) Cardiac Rhythm: Normal sinus rhythm (11/21 1906) Resp:  [12-23] 16 (11/22 0400) BP: (96-143)/(64-95) 96/64 (11/22 0400) SpO2:  [94 %-100 %] 97 % (11/22 0400) Arterial Line BP: (110-133)/(51-68) 110/51 (11/21 1230)     Intake/Output from previous day: 11/21 0701 - 11/22 0700 In: 3758.8 [P.O.:720; I.V.:2538.8; IV Piggyback:500] Out: 1516 [Urine:1200; Blood:100; Chest Tube:216]   Physical Exam:  Cardiovascular: RRR Pulmonary: Clear to auscultation on left and coarse on right (chest tube in place) Abdomen: Soft, non tender, bowel sounds present. Extremities: SCDs in place Wounds: Clean and dry.  No erythema or signs of infection. Chest Tube: to water seal, +++ air leak with cough  Lab Results: CBC: Recent Labs    03/05/23 1400 03/07/23 0941 03/08/23 0234  WBC 9.7  --  14.6*  HGB 14.7 12.9* 11.8*  HCT 43.0 38.0* 34.8*  PLT 242  --  182   BMET:  Recent Labs    03/05/23 1400 03/07/23 0941 03/08/23 0234  NA 138 142 137  K 3.6 3.7 4.1  CL 106  --  106  CO2 24  --  21*  GLUCOSE 119*  --  126*  BUN 15  --  17  CREATININE 1.19  --  1.05  CALCIUM 9.8  --  8.5*    PT/INR:  Recent Labs    03/05/23 1400  LABPROT 14.8  INR 1.1   ABG:  INR: Will add last result for INR, ABG once components are confirmed Will add last 4 CBG results once components are confirmed  Assessment/Plan:  1. CV - SR with HR in the 70's. 2.  Pulmonary - On room air this am. Chest  tube with 216 cc of output since surgery. Chest tube with air leak with cough. CXR this am appears to show increased right pneumothorax.Chest tube to remain for now. ? Place back to suction. Check CXR in am. Encourage incentive spirometer. Await final pathology. 3. Expected post op blood loss anemia-H and H decreased to 11.8 and 34.8 4. Lovenox to start this evening for DVT prophylaxis 5. Stop IVF, remove left hand IV and foley  Donielle M ZimmermanPA-C 03/08/2023,7:22 AM  Patient seen and examined, agree with above + air leak- will leave on water seal today.  I think he is going to have a space as it was a big upper lobe Ambulate  Viviann Spare C. Dorris Fetch, MD [facility] [phone]

## 2023-03-09 ENCOUNTER — Inpatient Hospital Stay (HOSPITAL_COMMUNITY): Payer: Medicare PPO

## 2023-03-09 ENCOUNTER — Other Ambulatory Visit: Payer: Self-pay

## 2023-03-09 LAB — CBC
HCT: 35.9 % — ABNORMAL LOW (ref 39.0–52.0)
Hemoglobin: 12.6 g/dL — ABNORMAL LOW (ref 13.0–17.0)
MCH: 32.1 pg (ref 26.0–34.0)
MCHC: 35.1 g/dL (ref 30.0–36.0)
MCV: 91.3 fL (ref 80.0–100.0)
Platelets: 178 10*3/uL (ref 150–400)
RBC: 3.93 MIL/uL — ABNORMAL LOW (ref 4.22–5.81)
RDW: 12.3 % (ref 11.5–15.5)
WBC: 11.6 10*3/uL — ABNORMAL HIGH (ref 4.0–10.5)
nRBC: 0 % (ref 0.0–0.2)

## 2023-03-09 LAB — COMPREHENSIVE METABOLIC PANEL
ALT: 13 U/L (ref 0–44)
AST: 18 U/L (ref 15–41)
Albumin: 3.4 g/dL — ABNORMAL LOW (ref 3.5–5.0)
Alkaline Phosphatase: 37 U/L — ABNORMAL LOW (ref 38–126)
Anion gap: 9 (ref 5–15)
BUN: 12 mg/dL (ref 8–23)
CO2: 21 mmol/L — ABNORMAL LOW (ref 22–32)
Calcium: 8.3 mg/dL — ABNORMAL LOW (ref 8.9–10.3)
Chloride: 104 mmol/L (ref 98–111)
Creatinine, Ser: 0.89 mg/dL (ref 0.61–1.24)
GFR, Estimated: >60 mL/min (ref >60)
Glucose, Bld: 104 mg/dL — ABNORMAL HIGH (ref 70–99)
Potassium: 3.4 mmol/L — ABNORMAL LOW (ref 3.5–5.1)
Sodium: 134 mmol/L — ABNORMAL LOW (ref 135–145)
Total Bilirubin: 0.8 mg/dL (ref <1.2)
Total Protein: 6.1 g/dL — ABNORMAL LOW (ref 6.5–8.1)

## 2023-03-09 MED ORDER — POLYETHYLENE GLYCOL 3350 17 G PO PACK
17.0000 g | PACK | Freq: Every day | ORAL | Status: DC
  Administered 2023-03-09 – 2023-03-12 (×4): 17 g via ORAL
  Filled 2023-03-09 (×4): qty 1

## 2023-03-09 MED ORDER — LACTULOSE 10 GM/15ML PO SOLN
20.0000 g | Freq: Every day | ORAL | Status: DC | PRN
  Filled 2023-03-09: qty 30

## 2023-03-09 MED ORDER — POTASSIUM CHLORIDE CRYS ER 20 MEQ PO TBCR
30.0000 meq | EXTENDED_RELEASE_TABLET | Freq: Two times a day (BID) | ORAL | Status: AC
  Administered 2023-03-09 (×2): 30 meq via ORAL
  Filled 2023-03-09 (×2): qty 1

## 2023-03-09 MED ORDER — GUAIFENESIN ER 600 MG PO TB12
600.0000 mg | ORAL_TABLET | Freq: Two times a day (BID) | ORAL | Status: DC
  Administered 2023-03-09 – 2023-03-10 (×3): 600 mg via ORAL
  Filled 2023-03-09 (×3): qty 1

## 2023-03-09 NOTE — Progress Notes (Signed)
RN paged MD regarding increase in bp 156/99.

## 2023-03-09 NOTE — Progress Notes (Addendum)
      [address]       [place] Sch[place] [address]             [phone]       2 Days Post-Op Procedure(s) (LRB): XI ROBOTIC ASSISTED THORACOSCOPY-RIGHT UPPER LOBECTOMY (Right) INTERCOSTAL NERVE BLOCK, RIGHT CHEST (Right) LYMPH NODE DISSECTION, RIGHT LUNG (Right)  Subjective: Patient did not sleep well. He did walk once yesterday.  Objective: Vital signs in last 24 hours: Temp:  [97.8 F (36.6 C)-98.4 F (36.9 C)] 98.4 F (36.9 C) (11/23 0752) Pulse Rate:  [69-84] 84 (11/23 0422) Cardiac Rhythm: Normal sinus rhythm (11/23 0700) Resp:  [18-20] 20 (11/23 0422) BP: (121-150)/(69-84) 139/84 (11/23 0752) SpO2:  [92 %-96 %] 92 % (11/23 0422)     Intake/Output from previous day: 11/22 0701 - 11/23 0700 In: 353.1 [P.O.:120; I.V.:233.1] Out: 190 [Chest Tube:190]   Physical Exam:  Cardiovascular: RRR Pulmonary: Clear to auscultation on left and coarse on right (chest tube in place) Abdomen: Soft, non tender, bowel sounds present. Extremities: SCDs in place Wounds: Clean and dry.  No erythema or signs of infection. There is sero sanguinous drainage from around chest tube. Chest Tube: to water seal, +++ air leak with cough  Lab Results: CBC: Recent Labs    03/08/23 0234 03/09/23 0207  WBC 14.6* 11.6*  HGB 11.8* 12.6*  HCT 34.8* 35.9*  PLT 182 178   BMET:  Recent Labs    03/08/23 0234 03/09/23 0207  NA 137 134*  K 4.1 3.4*  CL 106 104  CO2 21* 21*  GLUCOSE 126* 104*  BUN 17 12  CREATININE 1.05 0.89  CALCIUM 8.5* 8.3*    PT/INR:  No results for input(s): "LABPROT", "INR" in the last 72 hours.  ABG:  INR: Will add last result for INR, ABG once components are confirmed Will add last 4 CBG results once components are confirmed  Assessment/Plan:  1. CV - SR with HR in the 70's. 2.  Pulmonary - On room air this am. Chest tube with 190 cc recorded for 12 hours. Chest tube with air leak with cough. CXR this am appears to show patient rotated to  the right, ? Small right apical pneumothorax. Chest tube to remain for now. Mucinex bid for cough.  Check CXR in am. Encourage incentive spirometer and flutter valve. Final Pathology: lymph nodes appear negative for carcinoma. 3. Expected post op blood loss anemia-H and H decreased to 11.8 and 34.8 4. Lovenox to start this evening for DVT prophylaxis 5. Supplement potassium 6. GI-he is concerned about constipation with narcotics. Miralax daily as patient takes at home. Lactulose PRN constipation  Donielle M ZimmermanPA-C 03/09/2023,8:35 AM  Patient seen and examined, agree with above A lot of tidal variation but minimal air leak when I saw him- leave tube on water seal Path showed a complete pathologic response!  Salvatore Decent Dorris Fetch, MD [facility] [phone]

## 2023-03-09 NOTE — Progress Notes (Signed)
Mobility Specialist Progress Note:   03/09/23 1400  Mobility  Activity Ambulated independently in hallway  Level of Assistance Modified independent, requires aide device or extra time  Assistive Device None  Distance Ambulated (ft) 475 ft  Activity Response Tolerated well  Mobility Referral Yes  $Mobility charge 1 Mobility  Mobility Specialist Start Time (ACUTE ONLY) 1439  Mobility Specialist Stop Time (ACUTE ONLY) 1445  Mobility Specialist Time Calculation (min) (ACUTE ONLY) 6 min    Received pt ambulating in room having no complaints and agreeable to mobility. Pt was asymptomatic throughout ambulation and returned to room w/o fault. Left ambulating in room w/ call bell in reach and all needs met.   D'Vante Earlene Plater Mobility Specialist Please contact via Special educational needs teacher or Rehab office at [phone]

## 2023-03-10 ENCOUNTER — Inpatient Hospital Stay (HOSPITAL_COMMUNITY): Payer: Medicare PPO

## 2023-03-10 MED ORDER — LEVALBUTEROL HCL 0.63 MG/3ML IN NEBU
0.6300 mg | INHALATION_SOLUTION | Freq: Three times a day (TID) | RESPIRATORY_TRACT | Status: DC
  Administered 2023-03-10 – 2023-03-11 (×3): 0.63 mg via RESPIRATORY_TRACT
  Filled 2023-03-10 (×3): qty 3

## 2023-03-10 MED ORDER — LEVALBUTEROL HCL 0.63 MG/3ML IN NEBU: 0.6300 mg | INHALATION_SOLUTION | Freq: Three times a day (TID) | RESPIRATORY_TRACT | Status: DC | PRN

## 2023-03-10 MED ORDER — HYDRALAZINE HCL 20 MG/ML IJ SOLN: 10.0000 mg | Freq: Four times a day (QID) | INTRAMUSCULAR | Status: DC | PRN

## 2023-03-10 MED ORDER — GUAIFENESIN ER 600 MG PO TB12
1200.0000 mg | ORAL_TABLET | Freq: Two times a day (BID) | ORAL | Status: DC
  Administered 2023-03-10 – 2023-03-12 (×4): 1200 mg via ORAL
  Filled 2023-03-10 (×4): qty 2

## 2023-03-10 NOTE — Progress Notes (Addendum)
      [address]       [place] [address]             [phone]       3 Days Post-Op Procedure(s) (LRB): XI ROBOTIC ASSISTED THORACOSCOPY-RIGHT UPPER LOBECTOMY (Right) INTERCOSTAL NERVE BLOCK, RIGHT CHEST (Right) LYMPH NODE DISSECTION, RIGHT LUNG (Right)  Subjective: Patient coughing a lot and trying to bring up sputum.  Objective: Vital signs in last 24 hours: Temp:  [97.8 F (36.6 C)-98.4 F (36.9 C)] 98 F (36.7 C) (11/24 0737) Pulse Rate:  [93-100] 97 (11/24 0418) Cardiac Rhythm: Normal sinus rhythm (11/24 0400) Resp:  [17-33] 18 (11/24 0737) BP: (137-156)/(89-104) 137/93 (11/24 0737) SpO2:  [80 %-96 %] 96 % (11/24 0737)     Intake/Output from previous day: 11/23 0701 - 11/24 0700 In: -  Out: 140 [Chest Tube:140]   Physical Exam:  Cardiovascular: RRR Pulmonary: Expiratory wheezing, coarse on right Abdomen: Soft, non tender, bowel sounds present. Extremities: No LE edema Wounds: Clean and dry.  No erythema or signs of infection.  Chest Tube: to water seal, ++ air leak with cough  Lab Results: CBC: Recent Labs    03/08/23 0234 03/09/23 0207  WBC 14.6* 11.6*  HGB 11.8* 12.6*  HCT 34.8* 35.9*  PLT 182 178   BMET:  Recent Labs    03/08/23 0234 03/09/23 0207  NA 137 134*  K 4.1 3.4*  CL 106 104  CO2 21* 21*  GLUCOSE 126* 104*  BUN 17 12  CREATININE 1.05 0.89  CALCIUM 8.5* 8.3*    PT/INR:  No results for input(s): "LABPROT", "INR" in the last 72 hours.  ABG:  INR: Will add last result for INR, ABG once components are confirmed Will add last 4 CBG results once components are confirmed  Assessment/Plan:  1. CV - SR with HR in the 70's. 2.  Pulmonary - On room air. Chest tube with 140 cc recorded for 24 hours. Chest tube has air leak with cough. CXR this am appears to show increased right apical pneumothorax/space. Chest tube to remain to suction for 24 hours. Chest tube kinked as taped to back. I repositioned chest tube  so more on his right side. On Mucinex bid for cough but will increase dose. Xopenex for wheezing.  Check CXR in am. Encourage incentive spirometer and flutter valve.  3. Expected post op blood loss anemia-H and H decreased to 11.8 and 34.8 4. Lovenox to start this evening for DVT prophylaxis 5. GI-he is concerned about constipation with narcotics. Continue Miralax daily as patient takes at home. Lactulose PRN constipation  Brandon Wierenga M ZimmermanPA-C 03/10/2023,8:37 AM

## 2023-03-10 NOTE — Plan of Care (Signed)

## 2023-03-10 NOTE — Progress Notes (Signed)
Mobility Specialist Progress Note:   03/10/23 1400  Mobility  Activity Ambulated independently in hallway  Level of Assistance Modified independent, requires aide device or extra time  Assistive Device None  Distance Ambulated (ft) 475 ft  Activity Response Tolerated well  Mobility Referral Yes  $Mobility charge 1 Mobility  Mobility Specialist Start Time (ACUTE ONLY) 1358  Mobility Specialist Stop Time (ACUTE ONLY) 1406  Mobility Specialist Time Calculation (min) (ACUTE ONLY) 8 min    Pt received ambulating in room, agreeable to mobility. Asymptomatic w/ no complaints throughout. Pt left ambulating in room with call bell near and all needs met. Family present.  Brandon Schultz Mobility Specialist Please contact via Special educational needs teacher or Rehab office at [phone]

## 2023-03-11 ENCOUNTER — Inpatient Hospital Stay (HOSPITAL_COMMUNITY): Payer: Medicare PPO

## 2023-03-11 MED ORDER — PHENOL 1.4 % MT LIQD
1.0000 | OROMUCOSAL | Status: DC | PRN
  Administered 2023-03-11: 1 via OROMUCOSAL
  Filled 2023-03-11: qty 177

## 2023-03-11 MED ORDER — LEVALBUTEROL HCL 0.63 MG/3ML IN NEBU
0.6300 mg | INHALATION_SOLUTION | Freq: Two times a day (BID) | RESPIRATORY_TRACT | Status: DC
  Administered 2023-03-11: 0.63 mg via RESPIRATORY_TRACT
  Filled 2023-03-11: qty 3

## 2023-03-11 NOTE — Care Management Important Message (Signed)
Important Message  Patient Details  Name: Brandon Schultz MRN: [id number] Date of Birth: Apr 23, 1950   Important Message Given:  Yes - Medicare IM     Dorena Bodo 03/11/2023, 3:18 PM

## 2023-03-11 NOTE — TOC CM/SW Note (Signed)
Transition of Care [facility]) - Inpatient Brief Assessment   Patient Details  Name: Brandon Schultz MRN: [id number] Date of Birth: [birth year]  [facility]) CM/SW Contact:    Harriet Masson, RN Phone Number: 03/11/2023, 1:13 PM   Clinical Narrative:  4 Days Post-Op Procedure(s) (LRB): XI ROBOTIC ASSISTED THORACOSCOPY-RIGHT UPPER LOBECTOMY. No TOC needs at this time.   Transition of Care Asessment: Insurance and Status: Insurance coverage has been reviewed Patient has primary care physician: Yes Home environment has been reviewed: safe to discharge home Prior level of function:: independent Prior/Current Home Services: No current home services Social Determinants of Health Reivew: SDOH reviewed no interventions necessary Readmission risk has been reviewed: Yes Transition of care needs: no transition of care needs at this time

## 2023-03-11 NOTE — Progress Notes (Addendum)
      [address]       [place] [address]             [phone]       4 Days Post-Op Procedure(s) (LRB): XI ROBOTIC ASSISTED THORACOSCOPY-RIGHT UPPER LOBECTOMY (Right) INTERCOSTAL NERVE BLOCK, RIGHT CHEST (Right) LYMPH NODE DISSECTION, RIGHT LUNG (Right)  Subjective: Patient has sore throat. He did walk yesterday.   Objective: Vital signs in last 24 hours: Temp:  [97.6 F (36.4 C)-98.1 F (36.7 C)] 98.1 F (36.7 C) (11/25 0300) Pulse Rate:  [86-90] 90 (11/25 0300) Cardiac Rhythm: Normal sinus rhythm (11/24 1900) Resp:  [16-23] 22 (11/25 0300) BP: (116-137)/(67-93) 116/79 (11/25 0300) SpO2:  [91 %-96 %] 91 % (11/24 2336)     Intake/Output from previous day: 11/24 0701 - 11/25 0700 In: 1217 [P.O.:1217] Out: 66 [Chest Tube:66]   Physical Exam:  Cardiovascular: Tachycardic Pulmonary: Expiratory wheezing, coarse on right Abdomen: Soft, non tender, bowel sounds present. Extremities: No LE edema Wounds: Clean and dry.  No erythema or signs of infection.  Chest Tube: to suction, + intermittent air leak with cough  Lab Results: CBC: Recent Labs    03/09/23 0207  WBC 11.6*  HGB 12.6*  HCT 35.9*  PLT 178   BMET:  Recent Labs    03/09/23 0207  NA 134*  K 3.4*  CL 104  CO2 21*  GLUCOSE 104*  BUN 12  CREATININE 0.89  CALCIUM 8.3*    PT/INR:  No results for input(s): "LABPROT", "INR" in the last 72 hours.  ABG:  INR: Will add last result for INR, ABG once components are confirmed Will add last 4 CBG results once components are confirmed  Assessment/Plan:  1. CV -ST this am. 2.  Pulmonary - On room air. Chest tube with 66 cc recorded for 24 hours. Chest tube has small, intermittent air leak with cough. CXR this am appears to show right lung re expanded, subcutaneous emphysema right lateral chest wall/neck. Place chest tube to water seal.  On Mucinex bid for cough but will increase dose. Xopenex for wheezing.  Check CXR in am.  Encourage incentive spirometer and flutter valve.  3. Expected post op blood loss anemia-Last H and H decreased to 11.8 and 34.8 4. Lovenox to start this evening for DVT prophylaxis 5. GI-he is concerned about constipation with narcotics. Continue Miralax daily as patient takes at home. Lactulose PRN constipation 6. Chloraseptic spray PRN   Donielle M ZimmermanPA-C 03/11/2023,7:31 AM  Patient seen and examined, agree with findings and plan outlined above  Viviann Spare C. Dorris Fetch, MD [facility] [phone]

## 2023-03-11 NOTE — Progress Notes (Signed)
Mobility Specialist Progress Note:   03/11/23 0920  Mobility  Activity Ambulated with assistance in hallway  Level of Assistance Contact guard assist, steadying assist  Assistive Device None  Distance Ambulated (ft) 275 ft  Activity Response Tolerated well  Mobility Referral Yes  $Mobility charge 1 Mobility  Mobility Specialist Start Time (ACUTE ONLY) 0920  Mobility Specialist Stop Time (ACUTE ONLY) 0930  Mobility Specialist Time Calculation (min) (ACUTE ONLY) 10 min   Pt agreeable to mobility session. Chest tube on water seal upon arrival. Required only minG assist for safety. No c/o throughout. VSS on RA. Pt back in bed with all needs met.   Brandon Schultz Mobility Specialist Please contact via SecureChat or  Rehab office at [phone]

## 2023-03-12 ENCOUNTER — Inpatient Hospital Stay (HOSPITAL_COMMUNITY): Payer: Medicare PPO

## 2023-03-12 MED ORDER — GABAPENTIN 300 MG PO CAPS: 300.0000 mg | ORAL_CAPSULE | Freq: Every day | ORAL | 0 refills | Status: DC

## 2023-03-12 MED ORDER — GUAIFENESIN ER 600 MG PO TB12: 1200.0000 mg | ORAL_TABLET | Freq: Two times a day (BID) | ORAL | Status: DC | PRN

## 2023-03-12 MED ORDER — LEVALBUTEROL HCL 0.63 MG/3ML IN NEBU
0.6300 mg | INHALATION_SOLUTION | RESPIRATORY_TRACT | Status: AC
  Administered 2023-03-12: 0.63 mg via RESPIRATORY_TRACT
  Filled 2023-03-12: qty 3

## 2023-03-12 MED ORDER — OXYCODONE HCL 5 MG PO TABS: 5.0000 mg | ORAL_TABLET | Freq: Four times a day (QID) | ORAL | 0 refills | Status: DC | PRN

## 2023-03-12 MED ORDER — LEVALBUTEROL HCL 0.63 MG/3ML IN NEBU: 0.6300 mg | INHALATION_SOLUTION | Freq: Two times a day (BID) | RESPIRATORY_TRACT | Status: DC

## 2023-03-12 NOTE — Progress Notes (Signed)
Removed patients' chest tube with no complications

## 2023-03-12 NOTE — Progress Notes (Signed)
Went over AVS paperwork with patient, answered any/all questions, removed Iv, patient got dressed and wheeled out to family vehicle.

## 2023-03-12 NOTE — Progress Notes (Addendum)
      [address]       [place] [address]             [phone]       5 Days Post-Op Procedure(s) (LRB): XI ROBOTIC ASSISTED THORACOSCOPY-RIGHT UPPER LOBECTOMY (Right) INTERCOSTAL NERVE BLOCK, RIGHT CHEST (Right) LYMPH NODE DISSECTION, RIGHT LUNG (Right)  Subjective: He walked a lot yesterday. Patient eating breakfast. No specific complaint this am. H  Objective: Vital signs in last 24 hours: Temp:  [97.5 F (36.4 C)-98.5 F (36.9 C)] 98.5 F (36.9 C) (11/26 0500) Pulse Rate:  [81-110] 91 (11/26 0500) Cardiac Rhythm: Normal sinus rhythm (11/25 1911) Resp:  [13-20] 13 (11/26 0500) BP: (96-142)/(73-94) 109/77 (11/26 0500) SpO2:  [92 %-97 %] 93 % (11/26 0500)      Intake/Output from previous day: 11/25 0701 - 11/26 0700 In: 600 [P.O.:600] Out: 100 [Chest Tube:100]   Physical Exam:  Cardiovascular:Slightly tachycardic Pulmonary: Expiratory wheezing Abdomen: Soft, non tender, bowel sounds present. Extremities: No lower extremity edema. Wounds: Clean and dry.  No erythema or signs of infection. Chest Tube: to water seal, tidling but no air leak with cough  Lab Results: CBC:No results for input(s): "WBC", "HGB", "HCT", "PLT" in the last 72 hours. BMET: No results for input(s): "NA", "K", "CL", "CO2", "GLUCOSE", "BUN", "CREATININE", "CALCIUM" in the last 72 hours.  PT/INR: No results for input(s): "LABPROT", "INR" in the last 72 hours. ABG:  INR: Will add last result for INR, ABG once components are confirmed Will add last 4 CBG results once components are confirmed  Assessment/Plan:   1. CV -Slightly tachycardic this am. 2.  Pulmonary - On room air. Chest tube with 100 cc recorded for 24 hours. Chest tube to water seal, tidling with cough. CXR this am appears to show ? trace right apical pneumothorax, subcutaneous emphysema right lateral chest wall/neck.   Hope to remove chest tube.On Mucinex bid for cough but will increase dose. Xopenex for  wheezing.   Encourage incentive spirometer and flutter valve.  3. Expected post op blood loss anemia-Last H and H decreased to 11.8 and 34.8 4. Continue Lovenox for DVT prophylaxis 5. GI-he is concerned about constipation with narcotics. Continue Miralax daily as patient takes at home. Lactulose PRN constipation  Donielle M ZimmermanPA-C 03/12/2023,7:03 AM  Patient seen and examined, agree with above Tidal variation but no air leak- dc chest tube Possibly home later today  Viviann Spare C. Dorris Fetch, MD [facility] [phone]

## 2023-03-12 NOTE — Progress Notes (Signed)
Mobility Specialist Progress Note:    03/12/23 0900  Mobility  Activity Refused mobility  Mobility Specialist Start Time (ACUTE ONLY) 0925   Pt refused mobility d/t chet tube coming out soon and anticipating discharge. Will f/u as able.    D'Vante Earlene Plater Mobility Specialist Please contact via Special educational needs teacher or Rehab office at [phone]

## 2023-03-13 ENCOUNTER — Telehealth: Payer: Self-pay

## 2023-03-13 NOTE — Transitions of Care (Post Inpatient/ED Visit) (Signed)
03/13/2023  Name: Brandon Schultz MRN: [id number] DOB: [birth year]  Today's TOC FU Call Status: Today's TOC FU Call Status:: Successful TOC FU Call Completed TOC FU Call Complete Date: 03/13/23 Patient's Name and Date of Birth confirmed.  Transition Care Management Follow-up Telephone Call How have you been since you were released from the hospital?: Same (Reports sleepng. Continues to have a cough. Feels pain is under control.)  Items Reviewed: Did you receive and understand the discharge instructions provided?: Yes Medications obtained,verified, and reconciled?: Yes (Medications Reviewed) Any new allergies since your discharge?: No Dietary orders reviewed?: NA Do you have support at home?: Yes People in Home: spouse Name of Support/Comfort Primary Source: wife  Medications Reviewed Today: Medications Reviewed Today     Reviewed by Earlie Server, RN (Registered Nurse) on 03/13/23 at [phone]  Med List Status: <None>   Medication Order Taking? Sig Documenting Provider Last Dose Status Informant  acetaminophen (TYLENOL) 650 MG CR tablet [id number] Yes Take 1,300 mg by mouth every 8 (eight) hours as needed for pain. [provider] Taking Active Spouse/Significant Other  gabapentin (NEURONTIN) 300 MG capsule [id number] Yes Take 1 capsule (300 mg total) by mouth at bedtime. Doree Fudge M, PA-C Taking Active   guaiFENesin (MUCINEX) 600 MG 12 hr tablet [id number] Yes Take 2 tablets (1,200 mg total) by mouth 2 (two) times daily as needed for to loosen phlegm or cough. Ardelle Balls, PA-C Taking Active   oxyCODONE (OXY IR/ROXICODONE) 5 MG immediate release tablet [id number] Yes Take 1 tablet (5 mg total) by mouth every 6 (six) hours as needed for severe pain (pain score 7-10). Doree Fudge M, PA-C Taking Active   pantoprazole (PROTONIX) 40 MG tablet [id number] Yes TAKE 1 TABLET BY MOUTH 2 TIMES DAILY. Ronney Asters, NP Taking Active   polyethylene glycol powder  (GLYCOLAX/MIRALAX) 17 GM/SCOOP powder [id number] Yes Take 1 Container by mouth daily as needed for mild constipation. [provider] Taking Active   rosuvastatin (CRESTOR) 20 MG tablet [id number] Yes TAKE ONE TABLET BY MOUTH EVERY DAY Saralyn Pilar A, PA Taking Active Spouse/Significant Other  timolol (TIMOPTIC) 0.5 % ophthalmic solution [id number] Yes Place 1 drop into the left eye every morning. [provider] Taking Active Spouse/Significant Other            Home Care and Equipment/Supplies: Were Home Health Services Ordered?: No Any new equipment or medical supplies ordered?: No  Functional Questionnaire: Do you need assistance with bathing/showering or dressing?: No Do you need assistance with meal preparation?: No Do you need assistance with eating?: No Do you have difficulty maintaining continence: No Do you need assistance with getting out of bed/getting out of a chair/moving?: No Do you have difficulty managing or taking your medications?: No  Follow up appointments reviewed: PCP Follow-up appointment confirmed?: No MD Provider Line Number:[phone] Given: No Specialist Hospital Follow-up appointment confirmed?: Yes Date of Specialist follow-up appointment?: 03/27/23 Follow-Up Specialty Provider:: Dorris Fetch Do you need transportation to your follow-up appointment?: No Do you understand care options if your condition(s) worsen?: Yes-patient verbalized understanding  SDOH Interventions Today    Flowsheet Row Most Recent Value  SDOH Interventions   Food Insecurity Interventions Intervention Not Indicated  Housing Interventions Intervention Not Indicated  Transportation Interventions Intervention Not Indicated  Utilities Interventions Intervention Not Indicated      Spoke with wife, who reports that patient is doing really well. Reports that no problems at this time. Patient has a decreased appetite but is drinking well.  Wife reports she will  call PCP office and schedule follow up.   Reviewed constipation and importance of water and being up and moving.    Offered 30 day TOC program and wife declines. Provided my contact information if I am needed.  Lonia Chimera, RN, BSN, CEN [employer]- Transition of Care Team.  [facility] [phone]

## 2023-03-13 NOTE — Transitions of Care (Post Inpatient/ED Visit) (Signed)
   03/13/2023  Name: Brandon Schultz MRN: [id number] DOB: January 20, 1951  Today's TOC FU Call Status: Today's TOC FU Call Status:: Unsuccessful Call (1st Attempt) Unsuccessful Call (1st Attempt) Date: 03/13/23  Attempted to reach the patient regarding the most recent Inpatient/ED visit.  Follow Up Plan: Additional outreach attempts will be made to reach the patient to complete the Transitions of Care (Post Inpatient/ED visit) call.  Lonia Chimera, RN, BSN, CEN [employer]- Transition of Care Team.  [facility] [phone]

## 2023-03-20 ENCOUNTER — Encounter: Payer: Self-pay | Admitting: Thoracic Surgery (Cardiothoracic Vascular Surgery)

## 2023-03-21 ENCOUNTER — Inpatient Hospital Stay: Payer: Medicare PPO | Admitting: Family Medicine

## 2023-03-26 ENCOUNTER — Other Ambulatory Visit: Payer: Self-pay | Admitting: Thoracic Surgery (Cardiothoracic Vascular Surgery)

## 2023-03-26 DIAGNOSIS — C3491 Malignant neoplasm of unspecified part of right bronchus or lung: Secondary | ICD-10-CM

## 2023-03-27 ENCOUNTER — Ambulatory Visit
Admission: RE | Admit: 2023-03-27 | Discharge: 2023-03-27 | Disposition: A | Discharge status: Home / Self Care | Payer: Medicare PPO | Source: Ambulatory Visit | Attending: Thoracic Surgery (Cardiothoracic Vascular Surgery) | Admitting: Thoracic Surgery (Cardiothoracic Vascular Surgery)

## 2023-03-27 ENCOUNTER — Ambulatory Visit (INDEPENDENT_AMBULATORY_CARE_PROVIDER_SITE_OTHER): Payer: Self-pay | Admitting: Thoracic Surgery (Cardiothoracic Vascular Surgery)

## 2023-03-27 VITALS — BP 125/85 | HR 108 | Resp 20 | Ht 71.0 in | Wt 203.0 lb

## 2023-03-27 DIAGNOSIS — C3491 Malignant neoplasm of unspecified part of right bronchus or lung: Secondary | ICD-10-CM | POA: Diagnosis not present

## 2023-03-27 DIAGNOSIS — J939 Pneumothorax, unspecified: Secondary | ICD-10-CM | POA: Diagnosis not present

## 2023-03-27 DIAGNOSIS — J90 Pleural effusion, not elsewhere classified: Secondary | ICD-10-CM | POA: Diagnosis not present

## 2023-03-27 DIAGNOSIS — Z902 Acquired absence of lung [part of]: Secondary | ICD-10-CM

## 2023-03-27 NOTE — Progress Notes (Signed)
[address]       Brandon Sch[place] [address]             [phone]     HPI: Brandon Schultz returns for follow-up after recent right upper lobectomy.  Brandon Schultz is a 72 year old former smoker with a history of tobacco abuse, COPD, coronary calcification on CT, hyperlipidemia, melanoma, von Willebrand's disorder, chronic kidney disease, reflux, gout,, arthritis, and stage IIb non-small cell carcinoma of the lung.  Found to have a lung nodule on a low-dose CT in March.  A follow-up CT showed the nodule was persistent.  He had bronchoscopy which showed non-small cell carcinoma with sarcomatoid features.  PET/CT showed hilar adenopathy and clinical stage was 2B (T1, N1).  He underwent neoadjuvant chemoimmunotherapy by Dr. Shirline Frees.  Follow-up CT showed a good partial response.  I did a robotic assisted right upper lobectomy and node dissection on 03/07/2023.  He had an air leak initially but that resolved and he went home on day 5.  Pathology showed no residual tumor. ypT0,ypN0.  He complains of some pain along the right costal margin.  Also has some bulging in the right upper quadrant.  He is taking Tylenol.  He stopped taking gabapentin and oxycodone almost immediately after he went home because he felt like they were causing him to feel poorly.  No shortness of breath.  He is gradually increasing his walking over time.  Past Medical History:  Diagnosis Date   Allergy    seasonal   Arthritis    Asthma    Cervical stenosis of spine    Colon polyps    Complication of anesthesia    COPD (chronic obstructive pulmonary disease) (HCC)    Diverticulitis    Emphysema    GERD (gastroesophageal reflux disease)    Gum disease    H/O degenerative disc disease    Hemorrhoids    HOH (hard of hearing)    Inguinal hernia    Melanoma (HCC)    facial   PONV (postoperative nausea and vomiting)    Renal disease    Bright's Disease-childhood   Syncope    Umbilical  hernia    Urinary tract infection    Von Willebrand disease (HCC)     Current Outpatient Medications  Medication Sig Dispense Refill   acetaminophen (TYLENOL) 650 MG CR tablet Take 1,300 mg by mouth every 8 (eight) hours as needed for pain.     gabapentin (NEURONTIN) 300 MG capsule Take 1 capsule (300 mg total) by mouth at bedtime. 30 capsule 0   guaiFENesin (MUCINEX) 600 MG 12 hr tablet Take 2 tablets (1,200 mg total) by mouth 2 (two) times daily as needed for to loosen phlegm or cough.     oxyCODONE (OXY IR/ROXICODONE) 5 MG immediate release tablet Take 1 tablet (5 mg total) by mouth every 6 (six) hours as needed for severe pain (pain score 7-10). 30 tablet 0   pantoprazole (PROTONIX) 40 MG tablet TAKE 1 TABLET BY MOUTH 2 TIMES DAILY. 180 tablet 1   polyethylene glycol powder (GLYCOLAX/MIRALAX) 17 GM/SCOOP powder Take 1 Container by mouth daily as needed for mild constipation.     rosuvastatin (CRESTOR) 20 MG tablet TAKE ONE TABLET BY MOUTH EVERY DAY 90 tablet 1   timolol (TIMOPTIC) 0.5 % ophthalmic solution Place 1 drop into the left eye every morning.     No current facility-administered medications for this visit.    Physical Exam BP 125/85  Pulse (!) 108   Resp 20   Ht 5\' 11"  (1.803 m)   Wt 203 lb (92.1 kg)   SpO2 95% Comment: RA  BMI 28.71 kg/m  72 year old man in no acute distress Alert and oriented x 3 with no focal deficits Lungs diminished on right Incisions healing well Muscular laxity right upper quadrant Cardiac regular rate and rhythm (pulse 92 at time of exam)   Diagnostic Tests: CHEST - 2 VIEW   COMPARISON:  Chest radiograph 03/12/2023   FINDINGS: The cardiomediastinal silhouette is within normal limits. Postsurgical changes are again noted in the right hemithorax. There is a moderate-sized right apical pneumothorax which has enlarged from the prior study. Mild right basilar opacity likely reflects atelectasis, and there is a small right pleural  effusion. The left lung remains clear. Right-sided chest wall soft tissue emphysema has decreased. No acute osseous abnormality is seen.   IMPRESSION: 1. Increased size of a now moderate right pneumothorax. 2. Small right pleural effusion.   These results will be called to the ordering clinician or representative by the Radiologist Assistant, and communication documented in the PACS or [employer].     Electronically Signed   By: Sebastian Ache M.D.   On: 03/27/2023 15:36 I personally reviewed the chest x-ray images.  There is a space on the right.  I suspect this is more of a space issue than a pneumothorax issue.  He is not symptomatic.  Impression: Brandon Schultz is a 72 year old former smoker with a history of tobacco abuse, COPD, coronary calcification on CT, hyperlipidemia, melanoma, von Willebrand's disorder, chronic kidney disease, reflux, gout,, arthritis, and stage IIb non-small cell carcinoma of the lung.  Stage IIb non-small cell carcinoma of the lung with sarcomatoid features-status post neoadjuvant chemoimmunotherapy followed by right upper lobectomy.  Posttreatment pathology T0, N0.  Status post right upper lobectomy-she is doing quite well.  He does have some pain but is only taking Tylenol for that.  I suggest he might combine Tylenol and ibuprofen as that can often be more effective than either 1 individually.  He does not want to use narcotics.  I did talk him into trying gabapentin again as he does also have some peripheral neuropathy from his chemotherapy.  Will start with 300 mg at night.  If he tolerates that he can go to twice a day.  He has a significant space +/- pneumothorax on the right.  He is not short of breath or in distress and is not having any pain to suggest an acute pneumothorax.  I really suspect this is more of a space.  Plan: He knows to go to the emergency room if he starts feeling short of breath Will resume gabapentin 300 mg nightly.   If he does not have adverse effects will try 300 mg twice daily. Will add ibuprofen to Tylenol Follow-up with Dr. Arbutus Ped Return in 1 month with PA and lateral chest x-ray  [name], MD [facility] [phone]

## 2023-03-28 ENCOUNTER — Other Ambulatory Visit: Payer: Self-pay

## 2023-03-29 ENCOUNTER — Telehealth: Payer: Self-pay | Admitting: Internal Medicine

## 2023-04-11 ENCOUNTER — Other Ambulatory Visit: Payer: Self-pay

## 2023-04-11 ENCOUNTER — Inpatient Hospital Stay: Payer: Medicare PPO | Attending: Physician Assistant | Admitting: Internal Medicine

## 2023-04-11 ENCOUNTER — Inpatient Hospital Stay: Payer: Medicare PPO | Admitting: Internal Medicine

## 2023-04-11 VITALS — BP 133/89 | HR 88 | Temp 97.3°F | Resp 18 | Ht 71.0 in | Wt 206.3 lb

## 2023-04-11 DIAGNOSIS — C3491 Malignant neoplasm of unspecified part of right bronchus or lung: Secondary | ICD-10-CM

## 2023-04-11 DIAGNOSIS — G4700 Insomnia, unspecified: Secondary | ICD-10-CM | POA: Insufficient documentation

## 2023-04-11 DIAGNOSIS — C3411 Malignant neoplasm of upper lobe, right bronchus or lung: Secondary | ICD-10-CM | POA: Diagnosis not present

## 2023-04-11 DIAGNOSIS — G8918 Other acute postprocedural pain: Secondary | ICD-10-CM | POA: Diagnosis not present

## 2023-04-11 DIAGNOSIS — Z79899 Other long term (current) drug therapy: Secondary | ICD-10-CM | POA: Diagnosis not present

## 2023-04-11 MED ORDER — GABAPENTIN 300 MG PO CAPS: 300.0000 mg | ORAL_CAPSULE | Freq: Every day | ORAL | 0 refills | Status: DC

## 2023-04-11 MED ORDER — GABAPENTIN 300 MG PO CAPS: 300.0000 mg | ORAL_CAPSULE | Freq: Two times a day (BID) | ORAL | 0 refills | Status: DC

## 2023-04-11 NOTE — Progress Notes (Signed)
Patient's wife contacted the office requesting refill of Gabapentin. Patient was seen in clinic on 03/27/23 and Dr. Dorris Fetch advised patient to start back on Gabapentin nightly, however patient does not have refill at home. Will refill, Dr. Dorris Fetch aware.

## 2023-04-11 NOTE — Progress Notes (Signed)
[facility] [phone]   [phone]  OFFICE PROGRESS NOTE  [name], PA [address] [place] [address]  DIAGNOSIS: Stage IIb (T1c, N1, M0) non-small cell lung cancer, poorly differentiated with sarcomatoid features diagnosed in June 2024.  PRIOR THERAPY:  1) Neoadjuvant chemoimmunotherapy with carboplatin for AUC of 6, paclitaxel 200 Mg/M2 and nivolumab 360 mg IV every 3 weeks with Neulasta support.  First dose 11/21/2022.  Status post 3 cycles.  2) status post robotic assisted right upper lobectomy with lymph node dissection under the care of Dr. Dorris Fetch on March 07, 2023.  The final pathology showed no residual disease after the neoadjuvant chemoimmunotherapy.  CURRENT THERAPY: Adjuvant immunotherapy with nivolumab 480 Mg IV every 4 weeks.  First dose April 25, 2023  INTERVAL HISTORY: Brandon Schultz 72 y.o. male returns to the clinic today for follow-up visit accompanied by his wife.Discussed the use of AI scribe software for clinical note transcription with the patient, who gave verbal consent to proceed.  History of Present Illness   The patient, previously diagnosed with IIB NSCLC, underwent neoadjuvant chemotherapy with carboplatin, paclitaxel, and nivolumab, followed by a right upper lobectomy with lymph node dissection. The patient reported a good response to the treatment, with no viable tumor found during surgery, suggesting the chemotherapy and immunotherapy effectively eradicated most of the tumor.  Post-surgery, the patient experienced localized pain on the right side, suspected to be nerve-related. He also reported neuropathy in his hands and feet, likely a side effect of the chemotherapy. To manage these symptoms, the patient was prescribed gabapentin, which he requested to be refilled.  The patient also reported sleep disturbances, often waking up after only a few hours of sleep. He noted a lack of  appetite and a slight weight loss, although his weight has been relatively stable.  He was considering this option, but also expressed concerns about potential side effects and the logistics of receiving treatment while living in a different location during the summer months.       MEDICAL HISTORY: Past Medical History:  Diagnosis Date   Allergy    seasonal   Arthritis    Asthma    Cervical stenosis of spine    Colon polyps    Complication of anesthesia    COPD (chronic obstructive pulmonary disease) (HCC)    Diverticulitis    Emphysema    GERD (gastroesophageal reflux disease)    Gum disease    H/O degenerative disc disease    Hemorrhoids    HOH (hard of hearing)    Inguinal hernia    Melanoma (HCC)    facial   PONV (postoperative nausea and vomiting)    Renal disease    Bright's Disease-childhood   Syncope    Umbilical hernia    Urinary tract infection    Von Willebrand disease (HCC)     ALLERGIES:  has no known allergies.  MEDICATIONS:  Current Outpatient Medications  Medication Sig Dispense Refill   acetaminophen (TYLENOL) 650 MG CR tablet Take 1,300 mg by mouth every 8 (eight) hours as needed for pain.     gabapentin (NEURONTIN) 300 MG capsule Take 1 capsule (300 mg total) by mouth at bedtime. 30 capsule 0   guaiFENesin (MUCINEX) 600 MG 12 hr tablet Take 2 tablets (1,200 mg total) by mouth 2 (two) times daily as needed for to loosen phlegm or cough.     oxyCODONE (OXY IR/ROXICODONE) 5 MG  immediate release tablet Take 1 tablet (5 mg total) by mouth every 6 (six) hours as needed for severe pain (pain score 7-10). 30 tablet 0   pantoprazole (PROTONIX) 40 MG tablet TAKE 1 TABLET BY MOUTH 2 TIMES DAILY. 180 tablet 1   polyethylene glycol powder (GLYCOLAX/MIRALAX) 17 GM/SCOOP powder Take 1 Container by mouth daily as needed for mild constipation.     rosuvastatin (CRESTOR) 20 MG tablet TAKE ONE TABLET BY MOUTH EVERY DAY 90 tablet 1   timolol (TIMOPTIC) 0.5 % ophthalmic  solution Place 1 drop into the left eye every morning.     No current facility-administered medications for this visit.    SURGICAL HISTORY:  Past Surgical History:  Procedure Laterality Date   BRONCHIAL BIOPSY  09/25/2022   Procedure: BRONCHIAL BIOPSIES;  Surgeon: Leslye Peer, MD;  Location: [facility] ENDOSCOPY;  Service: Pulmonary;;   BRONCHIAL BRUSHINGS  09/25/2022   Procedure: BRONCHIAL BRUSHINGS;  Surgeon: Leslye Peer, MD;  Location: [facility] ENDOSCOPY;  Service: Pulmonary;;   BRONCHIAL NEEDLE ASPIRATION BIOPSY  09/25/2022   Procedure: BRONCHIAL NEEDLE ASPIRATION BIOPSIES;  Surgeon: Leslye Peer, MD;  Location: [facility] ENDOSCOPY;  Service: Pulmonary;;   cataract surgery Bilateral 11/2020   COLONOSCOPY  06/08/2021   07/27/2016   DENTAL SURGERY     FIDUCIAL MARKER PLACEMENT  09/25/2022   Procedure: FIDUCIAL MARKER PLACEMENT;  Surgeon: Leslye Peer, MD;  Location: [facility] ENDOSCOPY;  Service: Pulmonary;;   HERNIA REPAIR     Umbilical & Inguinal   INTERCOSTAL NERVE BLOCK Right 03/07/2023   Procedure: INTERCOSTAL NERVE BLOCK, RIGHT CHEST;  Surgeon: [name], MD;  Location: [facility] OR;  Service: Thoracic;  Laterality: Right;   left knee surgery     LYMPH NODE DISSECTION Right 03/07/2023   Procedure: LYMPH NODE DISSECTION, RIGHT LUNG;  Surgeon: [name], MD;  Location: [facility] OR;  Service: Thoracic;  Laterality: Right;   MELANOMA EXCISION     facial   neck and shoulder injection     POLYPECTOMY     WISDOM TOOTH EXTRACTION      REVIEW OF SYSTEMS:  Constitutional: negative Eyes: negative Ears, nose, mouth, throat, and face: negative Respiratory: positive for pleurisy/chest pain Cardiovascular: negative Gastrointestinal: negative Genitourinary:negative Integument/breast: negative Hematologic/lymphatic: negative Musculoskeletal:negative Neurological: positive for paresthesia Behavioral/Psych: negative Endocrine: negative Allergic/Immunologic: negative   PHYSICAL  EXAMINATION: General appearance: alert, cooperative, and no distress Head: Normocephalic, without obvious abnormality, atraumatic Neck: no adenopathy, no JVD, supple, symmetrical, trachea midline, and thyroid not enlarged, symmetric, no tenderness/mass/nodules Lymph nodes: Cervical, supraclavicular, and axillary nodes normal. Resp: clear to auscultation bilaterally Back: symmetric, no curvature. ROM normal. No CVA tenderness. Cardio: regular rate and rhythm, S1, S2 normal, no murmur, click, rub or gallop GI: soft, non-tender; bowel sounds normal; no masses,  no organomegaly Extremities: extremities normal, atraumatic, no cyanosis or edema Neurologic: Alert and oriented X 3, normal strength and tone. Normal symmetric reflexes. Normal coordination and gait  ECOG PERFORMANCE STATUS: 1 - Symptomatic but completely ambulatory  Blood pressure 133/89, pulse 88, temperature (!) 97.3 F (36.3 C), temperature source Temporal, resp. rate 18, height 5\' 11"  (1.803 m), weight 206 lb 4.8 oz (93.6 kg), SpO2 99%.  LABORATORY DATA: Lab Results  Component Value Date   WBC 11.6 (H) 03/09/2023   HGB 12.6 (L) 03/09/2023   HCT 35.9 (L) 03/09/2023   MCV 91.3 03/09/2023   PLT 178 03/09/2023      Chemistry      Component Value Date/Time  NA 134 (L) 03/09/2023 0207   NA 140 06/14/2022 0904   K 3.4 (L) 03/09/2023 0207   CL 104 03/09/2023 0207   CO2 21 (L) 03/09/2023 0207   BUN 12 03/09/2023 0207   BUN 21 06/14/2022 0904   CREATININE 0.89 03/09/2023 0207   CREATININE 0.92 01/15/2023 1410   CREATININE 0.92 02/18/2013 1431      Component Value Date/Time   CALCIUM 8.3 (L) 03/09/2023 0207   ALKPHOS 37 (L) 03/09/2023 0207   AST 18 03/09/2023 0207   AST 19 01/15/2023 1410   ALT 13 03/09/2023 0207   ALT 26 01/15/2023 1410   BILITOT 0.8 03/09/2023 0207   BILITOT 0.4 01/15/2023 1410       RADIOGRAPHIC STUDIES: DG Chest 2 View Result Date: 03/27/2023 CLINICAL DATA:  Non-small cell carcinoma of  right lung, stage II. EXAM: CHEST - 2 VIEW COMPARISON:  Chest radiograph 03/12/2023 FINDINGS: The cardiomediastinal silhouette is within normal limits. Postsurgical changes are again noted in the right hemithorax. There is a moderate-sized right apical pneumothorax which has enlarged from the prior study. Mild right basilar opacity likely reflects atelectasis, and there is a small right pleural effusion. The left lung remains clear. Right-sided chest wall soft tissue emphysema has decreased. No acute osseous abnormality is seen. IMPRESSION: 1. Increased size of a now moderate right pneumothorax. 2. Small right pleural effusion. These results will be called to the ordering clinician or representative by the Radiologist Assistant, and communication documented in the PACS or [employer]. Electronically Signed   By: Sebastian Ache M.D.   On: 03/27/2023 15:36   DG Chest Port 1V same Day Result Date: 03/12/2023 CLINICAL DATA:  Right-sided pneumothorax. Status post chest tube removal. EXAM: PORTABLE CHEST 1 VIEW COMPARISON:  Earlier radiograph dated 03/12/2023. FINDINGS: Interval removal of the right-sided chest tube. Small right pneumothorax measures approximately 1 cm in thickness. The left lung is clear. Stable cardiac silhouette. No acute osseous pathology. Right chest wall soft tissue emphysema similar or increased since the prior radiograph. IMPRESSION: Interval removal of the right-sided chest tube. Small right pneumothorax. Electronically Signed   By: Elgie Collard M.D.   On: 03/12/2023 17:02    ASSESSMENT AND PLAN: This is a very pleasant 72 years old white male diagnosed with stage IIb (T1c, N1, M0) non-small cell lung cancer, poorly differentiated with sarcomatoid features presented with right upper lobe lung nodule in addition to right suprahilar nodal metastasis diagnosed in June 2024. The patient had previous imaging studies including MRI of the brain and PET scan that showed no other evidence  of metastatic disease. He was seen by Dr. Dorris Fetch for surgical evaluation and he felt that the patient may benefit from a course of neoadjuvant chemoimmunotherapy first before consideration of surgical resection. He underwent a course of carboplatin for AUC of 6, paclitaxel 200 Mg/M2 and nivolumab 360 mg IV every 3 weeks with Neulasta support for 3 doses.  Status post 3 cycles.  He tolerated his previous treatment with immunotherapy fairly well and imaging studies showed partial response.  The patient underwent right upper lobectomy with lymph node dissection under the care of Dr. Dorris Fetch on March 07, 2023 and the final pathology showed no evidence for residual viable tumor. Assessment and Plan    Non-Small Cell Lung Cancer (NSCLC)   NSCLC treated with neoadjuvant chemoimmunotherapy (carboplatin, paclitaxel, nivolumab) followed by right upper lobectomy with lymph node dissection on March 07, 2023. Post-surgical pathology showed no viable tumor, indicating a good response. Discussed  post-surgical management options: 1) Watchful waiting with regular scans, or 2) Adjuvant immunotherapy with nivolumab for 6-12 months to reduce recurrence risk. Explained that immunotherapy alone is not expected to cause chemotherapy side effects like hair loss and nausea but can cause immune-related adverse effects such as skin rash, diarrhea, and organ inflammation. He expressed interest in starting immunotherapy in two weeks.   - Start adjuvant nivolumab in two weeks   - Coordinate with a medical oncologist in [facility] for local administration   - Evaluate treatment response at six months    Post-Surgical Pain   Intermittent right-sided pain post-lobectomy, likely due to nerve involvement. Gabapentin prescribed for nerve pain.   - Continue gabapentin 300 mg twice daily   - Advise caution regarding dizziness, especially when driving    Chemotherapy-Induced Peripheral Neuropathy   Neuropathy in hands  and feet, likely secondary to prior chemotherapy. Gabapentin prescribed to manage symptoms.   - Continue gabapentin 300 mg twice daily    Insomnia   Difficulty sleeping, potentially exacerbated by neuropathy and post-surgical pain. Gabapentin may aid in sleep.   - Continue gabapentin 300 mg twice daily    General Health Maintenance   Gained three pounds since early December, indicating stable weight.   - Monitor weight and appetite    Follow-up   - Schedule follow-up scan in three months if no treatment is initiated   - He is to inform the clinic of his decision within the next few weeks.   The patient was advised to call immediately if he has any other concerning symptoms in the interval. The patient voices understanding of current disease status and treatment options and is in agreement with the current care plan.  All questions were answered. The patient knows to call the clinic with any problems, questions or concerns. We can certainly see the patient much sooner if necessary.  The total time spent in the appointment was 30 minutes.  Disclaimer: This note was dictated with voice recognition software. Similar sounding words can inadvertently be transcribed and may not be corrected upon review.

## 2023-04-11 NOTE — Progress Notes (Signed)
DISCONTINUE ON PATHWAY REGIMEN - Non-Small Cell Lung     A cycle is every 21 days:     Nivolumab      Gemcitabine      Cisplatin   **Always confirm dose/schedule in your pharmacy ordering system**  PRIOR TREATMENT: [id number]: Cisplatin 75 mg/m2 D1 + Gemcitabine 1,250 mg/m2 D1, 8 + Nivolumab 360 mg D1 q21 Days x up to 3 Cycles  START OFF PATHWAY REGIMEN - Non-Small Cell Lung   [id number]:Nivolumab 480 mg IV D1 q28 Days:   A cycle is every 28 days:     Nivolumab   **Always confirm dose/schedule in your pharmacy ordering system**  Patient Characteristics: Post-Neoadjuvant Therapy and Resection (Neoadjuvant Pathologic Staging), Stage II Therapeutic Status: Post-Neoadjuvant Therapy and Resection (Neoadjuvant Pathologic Staging) AJCC T Category: ypT0 AJCC N Category: ypN0 AJCC M Category: cM0 AJCC 8 Stage Grouping: Unknown Intent of Therapy: Curative Intent, Discussed with Patient

## 2023-04-12 ENCOUNTER — Other Ambulatory Visit: Payer: Self-pay

## 2023-04-15 ENCOUNTER — Other Ambulatory Visit: Payer: Self-pay | Admitting: Physician Assistant

## 2023-04-16 ENCOUNTER — Encounter: Payer: Self-pay | Admitting: Internal Medicine

## 2023-04-18 ENCOUNTER — Other Ambulatory Visit: Payer: Self-pay | Admitting: Internal Medicine

## 2023-04-18 ENCOUNTER — Telehealth: Payer: Self-pay | Admitting: Internal Medicine

## 2023-04-18 DIAGNOSIS — C3490 Malignant neoplasm of unspecified part of unspecified bronchus or lung: Secondary | ICD-10-CM

## 2023-04-18 NOTE — Telephone Encounter (Signed)
 Patient is aware of scheduled appointments and also previous cancelled appointments

## 2023-04-18 NOTE — Progress Notes (Signed)
 Pharmacist Chemotherapy Monitoring - Initial Assessment    Anticipated start date: 04/25/23   The following has been reviewed per standard work regarding the patient's treatment regimen: The patient's diagnosis, treatment plan and drug doses, and organ/hematologic function Lab orders and baseline tests specific to treatment regimen  The treatment plan start date, drug sequencing, and pre-medications Prior authorization status  Patient's documented medication list, including drug-drug interaction screen and prescriptions for anti-emetics and supportive care specific to the treatment regimen The drug concentrations, fluid compatibility, administration routes, and timing of the medications to be used The patient's access for treatment and lifetime cumulative dose history, if applicable  The patient's medication allergies and previous infusion related reactions, if applicable   Changes made to treatment plan:  N/A  Follow up needed:  Pending authorization for treatment    Tyion Boylen, PharmD, MBA

## 2023-04-25 ENCOUNTER — Ambulatory Visit: Payer: Medicare PPO | Admitting: Physician Assistant

## 2023-04-25 ENCOUNTER — Other Ambulatory Visit: Payer: Medicare PPO

## 2023-04-25 ENCOUNTER — Ambulatory Visit: Payer: Medicare PPO

## 2023-05-13 ENCOUNTER — Other Ambulatory Visit: Payer: Self-pay | Admitting: Thoracic Surgery (Cardiothoracic Vascular Surgery)

## 2023-05-13 DIAGNOSIS — R911 Solitary pulmonary nodule: Secondary | ICD-10-CM

## 2023-05-14 ENCOUNTER — Ambulatory Visit
Admission: RE | Admit: 2023-05-14 | Discharge: 2023-05-14 | Disposition: A | Discharge status: Home / Self Care | Payer: Medicare PPO | Source: Ambulatory Visit | Attending: Thoracic Surgery (Cardiothoracic Vascular Surgery) | Admitting: Thoracic Surgery (Cardiothoracic Vascular Surgery)

## 2023-05-14 ENCOUNTER — Encounter: Payer: Self-pay | Admitting: Thoracic Surgery (Cardiothoracic Vascular Surgery)

## 2023-05-14 ENCOUNTER — Ambulatory Visit (INDEPENDENT_AMBULATORY_CARE_PROVIDER_SITE_OTHER): Payer: Self-pay | Admitting: Thoracic Surgery (Cardiothoracic Vascular Surgery)

## 2023-05-14 VITALS — BP 129/79 | HR 84 | Resp 18 | Ht 71.0 in | Wt 216.0 lb

## 2023-05-14 DIAGNOSIS — J439 Emphysema, unspecified: Secondary | ICD-10-CM | POA: Diagnosis not present

## 2023-05-14 DIAGNOSIS — C3491 Malignant neoplasm of unspecified part of right bronchus or lung: Secondary | ICD-10-CM

## 2023-05-14 DIAGNOSIS — Z902 Acquired absence of lung [part of]: Secondary | ICD-10-CM

## 2023-05-14 DIAGNOSIS — R911 Solitary pulmonary nodule: Secondary | ICD-10-CM

## 2023-05-14 NOTE — Progress Notes (Signed)
[address]       [name] [address]             [phone]     HPI: [name] returns for a scheduled follow-up after robotic assisted right upper lobectomy.  Brandon Schultz is a 73 year old man with a history of tobacco use, COPD, coronary calcification, hyperlipidemia, melanoma, von Willebrand's, CKD, reflux, gout, arthritis, and a stage IIb non-small cell carcinoma of the lung.  He was found to have a lung nodule in March 2024.  On follow-up the nodule persisted.  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  PET/CT showed hilar adenopathy.  Clinical stage was 2B.  He underwent neoadjuvant chemoimmunotherapy with Dr. Arbutus Ped.  He had a good radiographic response.  He then underwent a robotic assisted right upper lobectomy on 03/07/2023.  Pathology showed no residual tumor in the lung or nodes.  Postoperatively he had an air leak but that resolved and he went home on day 5.  Saw him back in the office in December.  He was having some intercostal neuralgia pain.  He was not taking narcotics but was only using Tylenol.  We put him back on some gabapentin but he stopped that because he was having nightmares and dizziness related to it.  Over time his pain has improved significantly.  Has not been very active with the cold weather.  He is having some issues with peripheral neuropathy due to chemotherapy.  He saw Dr. Arbutus Ped.  Was offered continued immunotherapy but declined that.  Past Medical History:  Diagnosis Date   Allergy    seasonal   Arthritis    Asthma    Cervical stenosis of spine    Colon polyps    Complication of anesthesia    COPD (chronic obstructive pulmonary disease) (HCC)    Diverticulitis    Emphysema    GERD (gastroesophageal reflux disease)    Gum disease    H/O degenerative disc disease    Hemorrhoids    HOH (hard of hearing)    Inguinal hernia    Melanoma (HCC)    facial   PONV (postoperative  nausea and vomiting)    Renal disease    Bright's Disease-childhood   Syncope    Umbilical hernia    Urinary tract infection    Von Willebrand disease (HCC)     Current Outpatient Medications  Medication Sig Dispense Refill   pantoprazole (PROTONIX) 40 MG tablet TAKE 1 TABLET BY MOUTH 2 TIMES DAILY. 180 tablet 1   rosuvastatin (CRESTOR) 20 MG tablet TAKE ONE TABLET BY MOUTH EVERY DAY 90 tablet 1   timolol (TIMOPTIC) 0.5 % ophthalmic solution Place 1 drop into the left eye every morning.     No current facility-administered medications for this visit.    Physical Exam BP 129/79 (BP Location: Left Arm)   Pulse 84   Resp 18   Ht 5\' 11"  (1.803 m)   Wt 216 lb (98 kg)   SpO2 96% Comment: RA  BMI 30.13 kg/m  Well-appearing 73 year old man in no acute distress Alert and oriented x 3 with no focal deficits Lungs diminished at right base but otherwise clear Cardiac regular rate and rhythm normal S1 and S2 Incisions well-healed  Diagnostic Tests: I personally reviewed his chest x-ray images.  Official reading not yet complete.  Postoperative changes from right upper lobectomy.  Impression: Brandon Schultz is a 73 year old man with a history of tobacco use, COPD,  coronary calcification, hyperlipidemia, melanoma, von Willebrand's, CKD, reflux, gout, arthritis, and a stage IIb non-small cell carcinoma of the lung.  Stage IIb non-small cell carcinoma of the lung-status post neoadjuvant chemoimmunotherapy followed by right upper lobectomy.  Had partial radiographic and complete pathologic response.  Currently under observation.  Has a planned CT in 3 months with Dr. Arbutus Ped.  Status post right upper lobectomy-doing well.  Does still have some discomfort but is not really taking any medication for that currently.  It has gotten significantly better since his last visit.  I encouraged him to start engaging in more physical activity.  Plan: Return in 6 months to check on  progress He knows he can call if he needs me sooner than that.  [name], MD [facility] [phone]

## 2023-05-23 ENCOUNTER — Encounter: Payer: Self-pay | Admitting: Family Medicine

## 2023-05-23 ENCOUNTER — Other Ambulatory Visit: Payer: Medicare PPO

## 2023-05-23 ENCOUNTER — Ambulatory Visit: Payer: Medicare PPO

## 2023-05-23 ENCOUNTER — Ambulatory Visit: Payer: Medicare PPO | Admitting: Internal Medicine

## 2023-06-03 ENCOUNTER — Telehealth: Payer: Self-pay | Admitting: Internal Medicine

## 2023-06-03 NOTE — Telephone Encounter (Signed)
[facility] [place] [place] Clinical Research Coordinator note:   This visit for Subject Brandon Schultz with DOB: [birth year] on 06/03/2023. Subject contacted regarding [id number] to inform that Principal Investigator has changed to Dr. Delton Coombes. Patient expressed understanding.

## 2023-06-19 ENCOUNTER — Encounter: Payer: Self-pay | Admitting: Internal Medicine

## 2023-06-20 ENCOUNTER — Ambulatory Visit: Payer: Medicare PPO | Admitting: Internal Medicine

## 2023-06-20 ENCOUNTER — Ambulatory Visit: Payer: Medicare PPO

## 2023-06-20 ENCOUNTER — Other Ambulatory Visit: Payer: Medicare PPO

## 2023-07-04 ENCOUNTER — Ambulatory Visit: Payer: Medicare PPO

## 2023-07-09 ENCOUNTER — Inpatient Hospital Stay: Payer: Medicare PPO | Attending: Physician Assistant

## 2023-07-09 ENCOUNTER — Ambulatory Visit (HOSPITAL_COMMUNITY)
Admission: RE | Admit: 2023-07-09 | Discharge: 2023-07-09 | Disposition: A | Discharge status: Home / Self Care | Payer: Medicare PPO | Source: Ambulatory Visit | Attending: Internal Medicine | Admitting: Internal Medicine

## 2023-07-09 DIAGNOSIS — I700 Atherosclerosis of aorta: Secondary | ICD-10-CM | POA: Insufficient documentation

## 2023-07-09 DIAGNOSIS — J439 Emphysema, unspecified: Secondary | ICD-10-CM | POA: Insufficient documentation

## 2023-07-09 DIAGNOSIS — C3411 Malignant neoplasm of upper lobe, right bronchus or lung: Secondary | ICD-10-CM | POA: Insufficient documentation

## 2023-07-09 DIAGNOSIS — Z902 Acquired absence of lung [part of]: Secondary | ICD-10-CM | POA: Insufficient documentation

## 2023-07-09 DIAGNOSIS — C3490 Malignant neoplasm of unspecified part of unspecified bronchus or lung: Secondary | ICD-10-CM | POA: Diagnosis not present

## 2023-07-09 DIAGNOSIS — J90 Pleural effusion, not elsewhere classified: Secondary | ICD-10-CM | POA: Diagnosis not present

## 2023-07-09 DIAGNOSIS — J8410 Pulmonary fibrosis, unspecified: Secondary | ICD-10-CM | POA: Diagnosis not present

## 2023-07-09 LAB — CBC WITH DIFFERENTIAL (CANCER CENTER ONLY)
Abs Immature Granulocytes: 0.02 10*3/uL (ref 0.00–0.07)
Basophils Absolute: 0.1 10*3/uL (ref 0.0–0.1)
Basophils Relative: 1 %
Eosinophils Absolute: 0.6 10*3/uL — ABNORMAL HIGH (ref 0.0–0.5)
Eosinophils Relative: 6 %
HCT: 44.4 % (ref 39.0–52.0)
Hemoglobin: 15.1 g/dL (ref 13.0–17.0)
Immature Granulocytes: 0 %
Lymphocytes Relative: 11 %
Lymphs Abs: 1.1 10*3/uL (ref 0.7–4.0)
MCH: 30.4 pg (ref 26.0–34.0)
MCHC: 34 g/dL (ref 30.0–36.0)
MCV: 89.3 fL (ref 80.0–100.0)
Monocytes Absolute: 1.1 10*3/uL — ABNORMAL HIGH (ref 0.1–1.0)
Monocytes Relative: 12 %
Neutro Abs: 6.8 10*3/uL (ref 1.7–7.7)
Neutrophils Relative %: 70 %
Platelet Count: 257 10*3/uL (ref 150–400)
RBC: 4.97 MIL/uL (ref 4.22–5.81)
RDW: 13.3 % (ref 11.5–15.5)
WBC Count: 9.7 10*3/uL (ref 4.0–10.5)
nRBC: 0 % (ref 0.0–0.2)

## 2023-07-09 LAB — CMP (CANCER CENTER ONLY)
ALT: 13 U/L (ref 0–44)
AST: 13 U/L — ABNORMAL LOW (ref 15–41)
Albumin: 4.8 g/dL (ref 3.5–5.0)
Alkaline Phosphatase: 68 U/L (ref 38–126)
Anion gap: 8 (ref 5–15)
BUN: 13 mg/dL (ref 8–23)
CO2: 26 mmol/L (ref 22–32)
Calcium: 9.8 mg/dL (ref 8.9–10.3)
Chloride: 103 mmol/L (ref 98–111)
Creatinine: 0.92 mg/dL (ref 0.61–1.24)
GFR, Estimated: >60 mL/min (ref >60)
Glucose, Bld: 136 mg/dL — ABNORMAL HIGH (ref 70–99)
Potassium: 3.6 mmol/L (ref 3.5–5.1)
Sodium: 137 mmol/L (ref 135–145)
Total Bilirubin: 0.5 mg/dL (ref 0.0–1.2)
Total Protein: 8 g/dL (ref 6.5–8.1)

## 2023-07-09 MED ORDER — IOHEXOL 300 MG/ML  SOLN
75.0000 mL | Freq: Once | INTRAMUSCULAR | Status: AC | PRN
  Administered 2023-07-09: 75 mL via INTRAVENOUS

## 2023-07-22 ENCOUNTER — Inpatient Hospital Stay: Payer: Medicare PPO | Attending: Physician Assistant | Admitting: Internal Medicine

## 2023-07-22 DIAGNOSIS — C3490 Malignant neoplasm of unspecified part of unspecified bronchus or lung: Secondary | ICD-10-CM | POA: Diagnosis not present

## 2023-07-22 MED ORDER — AZITHROMYCIN 250 MG PO TABS: ORAL_TABLET | ORAL | 0 refills | Status: DC

## 2023-07-22 NOTE — Progress Notes (Signed)
 [facility] [phone]   [phone]  PROGRESS NOTE FOR TELEMEDICINE VISITS  Brandon Quitter, PA [address] Brandon [place] [address]  I connected withNAME@ on 07/22/23 at 11:00 AM EDT by telephone visit and verified that I am speaking with the correct person using two identifiers.   I discussed the limitations, risks, security and privacy concerns of performing an evaluation and management service by telemedicine and the availability of in-person appointments. I also discussed with the patient that there may be a patient responsible charge related to this service. The patient expressed understanding and agreed to proceed.  Other persons participating in the visit and their role in the encounter:  wife  Patient's location:  Home Provider's location:  [facility]  DIAGNOSIS: Stage IIb (T1c, N1, M0) non-small cell lung cancer, poorly differentiated with sarcomatoid features diagnosed in June 2024.   PRIOR THERAPY:  1) Neoadjuvant chemoimmunotherapy with carboplatin for AUC of 6, paclitaxel 200 Mg/M2 and nivolumab 360 mg IV every 3 weeks with Neulasta support.  First dose 11/21/2022.  Status post 3 cycles.  2) status post robotic assisted right upper lobectomy with lymph node dissection under the care of Dr. Dorris Fetch on March 07, 2023.  The final pathology showed no residual disease after the neoadjuvant chemoimmunotherapy.   CURRENT THERAPY: Observation  INTERVAL HISTORY: Brandon Schultz 73 y.o. male has a telephone virtual visit with me today for evaluation and discussion of his recent scan results.Discussed the use of AI scribe software for clinical note transcription with the patient, who gave verbal consent to proceed.  History of Present Illness   Brandon Schultz is a 73 year old male with stage 2B non-small cell lung cancer who presents with cold symptoms. He is accompanied by his wife, Brandon Schultz.  He has stage 2B  non-small cell lung cancer, diagnosed in June 2024, characterized as poorly differentiated with sarcomatoid features. He underwent neoadjuvant chemo-immunotherapy with carboplatin, paclitaxel, and nivolumab for three cycles, followed by a right upper lobectomy with lymph node dissection on March 07, 2023. He is currently under observation. A recent scan showed some lymph nodes in the mediastinum and hilar area that are slightly larger than in the previous exam, although not pathologic by size criteria.  He presents with cold symptoms that have persisted for a couple of weeks, including significant nasal drainage and a productive cough with yellow sputum. No fever is reported, but he experiences chills. He has not taken any antibiotics for these symptoms, only over-the-counter medications. The symptoms began two days before the recent scan. No hemoptysis is noted.  Recent blood work showed elevated monocytes and eosinophils, which could be related to his current symptoms or allergies. His hemoglobin and hematocrit levels were within normal limits, but his blood sugar was elevated at 136 mg/dL, and his AST was low.       MEDICAL HISTORY: Past Medical History:  Diagnosis Date   Allergy    seasonal   Arthritis    Asthma    Cervical stenosis of spine    Colon polyps    Complication of anesthesia    COPD (chronic obstructive pulmonary disease) (HCC)    Diverticulitis    Emphysema    GERD (gastroesophageal reflux disease)    Gum disease    H/O degenerative disc disease    Hemorrhoids    HOH (hard of hearing)    Inguinal hernia    Melanoma (HCC)    facial  PONV (postoperative nausea and vomiting)    Renal disease    Bright's Disease-childhood   Syncope    Umbilical hernia    Urinary tract infection    Von Willebrand disease (HCC)     ALLERGIES:  has no known allergies.  MEDICATIONS:  Current Outpatient Medications  Medication Sig Dispense Refill   pantoprazole (PROTONIX) 40 MG  tablet TAKE 1 TABLET BY MOUTH 2 TIMES DAILY. 180 tablet 1   rosuvastatin (CRESTOR) 20 MG tablet TAKE ONE TABLET BY MOUTH EVERY DAY 90 tablet 1   timolol (TIMOPTIC) 0.5 % ophthalmic solution Place 1 drop into the left eye every morning.     No current facility-administered medications for this visit.    SURGICAL HISTORY:  Past Surgical History:  Procedure Laterality Date   BRONCHIAL BIOPSY  09/25/2022   Procedure: BRONCHIAL BIOPSIES;  Surgeon: Leslye Peer, MD;  Location: [facility] ENDOSCOPY;  Service: Pulmonary;;   BRONCHIAL BRUSHINGS  09/25/2022   Procedure: BRONCHIAL BRUSHINGS;  Surgeon: Leslye Peer, MD;  Location: [facility] ENDOSCOPY;  Service: Pulmonary;;   BRONCHIAL NEEDLE ASPIRATION BIOPSY  09/25/2022   Procedure: BRONCHIAL NEEDLE ASPIRATION BIOPSIES;  Surgeon: Leslye Peer, MD;  Location: [facility] ENDOSCOPY;  Service: Pulmonary;;   cataract surgery Bilateral 11/2020   COLONOSCOPY  06/08/2021   07/27/2016   DENTAL SURGERY     FIDUCIAL MARKER PLACEMENT  09/25/2022   Procedure: FIDUCIAL MARKER PLACEMENT;  Surgeon: Leslye Peer, MD;  Location: [facility] ENDOSCOPY;  Service: Pulmonary;;   HERNIA REPAIR     Umbilical & Inguinal   INTERCOSTAL NERVE BLOCK Right 03/07/2023   Procedure: INTERCOSTAL NERVE BLOCK, RIGHT CHEST;  Surgeon: [name], MD;  Location: [facility] OR;  Service: Thoracic;  Laterality: Right;   left knee surgery     LYMPH NODE DISSECTION Right 03/07/2023   Procedure: LYMPH NODE DISSECTION, RIGHT LUNG;  Surgeon: [name], MD;  Location: [facility] OR;  Service: Thoracic;  Laterality: Right;   MELANOMA EXCISION     facial   neck and shoulder injection     POLYPECTOMY     WISDOM TOOTH EXTRACTION      REVIEW OF SYSTEMS:  Constitutional: positive for fatigue Eyes: negative Ears, nose, mouth, throat, and face: positive for nasal congestion Respiratory: positive for cough and dyspnea on exertion Cardiovascular: negative Gastrointestinal:  negative Genitourinary:negative Integument/breast: negative Hematologic/lymphatic: negative Musculoskeletal:negative Neurological: negative Behavioral/Psych: negative Endocrine: negative Allergic/Immunologic: negative     LABORATORY DATA: Lab Results  Component Value Date   WBC 9.7 07/09/2023   HGB 15.1 07/09/2023   HCT 44.4 07/09/2023   MCV 89.3 07/09/2023   PLT 257 07/09/2023      Chemistry      Component Value Date/Time   NA 137 07/09/2023 0945   NA 140 06/14/2022 0904   K 3.6 07/09/2023 0945   CL 103 07/09/2023 0945   CO2 26 07/09/2023 0945   BUN 13 07/09/2023 0945   BUN 21 06/14/2022 0904   CREATININE 0.92 07/09/2023 0945   CREATININE 0.92 02/18/2013 1431      Component Value Date/Time   CALCIUM 9.8 07/09/2023 0945   ALKPHOS 68 07/09/2023 0945   AST 13 (L) 07/09/2023 0945   ALT 13 07/09/2023 0945   BILITOT 0.5 07/09/2023 0945       RADIOGRAPHIC STUDIES: CT Chest W Contrast Result Date: 07/19/2023 CLINICAL DATA:  Staging non-small-cell lung cancer. * Tracking Code: BO * EXAM: CT CHEST WITH CONTRAST TECHNIQUE: Multidetector CT imaging of the chest was performed  during intravenous contrast administration. RADIATION DOSE REDUCTION: This exam was performed according to the departmental dose-optimization program which includes automated exposure control, adjustment of the mA and/or kV according to patient size and/or use of iterative reconstruction technique. CONTRAST:  75mL OMNIPAQUE IOHEXOL 300 MG/ML  SOLN COMPARISON:  Chest x-ray 05/14/2023. CT chest 01/15/2023 and older. PET-CT 10/11/2022. FINDINGS: Cardiovascular: Thoracic aorta has a normal course and caliber with mild atherosclerotic calcified plaque. Coronary artery calcifications are seen. No pericardial effusion. Heart is nonenlarged. Prominent fat along the intra-atrial septum. Mediastinum/Nodes: Heterogeneous thyroid gland. Slightly patulous esophagus. No specific abnormal lymph node enlargement seen in the  axillary regions. There are some small mediastinal nodes identified which have slightly increased today. Several examples. Some include upper right-sided mediastinum measuring 8 x 16 mm on series 2, image 12 and previously 4 x 10 mm. Subcarinal node anterior to the esophagus on series 2, image 35 today measures 17 by 8 mm and previously would have measured 12 by 5 mm. Node just to the right of the lower esophagus on series 2, image 48 has short axis of 8 mm today and previously 4 mm. There is also similar appearance to some prominent hilar nodes. Recommend attention on short follow-up. There is a differential. Lungs/Pleura: Emphysematous lung changes identified. Also areas of interstitial septal thickening with scarring and fibrosis. Some honeycombing. Interval surgical changes of right-sided lobectomy of the upper lobe. Small right pleural effusion. Areas of scarring, thickening. Stable nodular thickening along left interlobar fissure, possible node. Upper Abdomen: Right adrenal gland is preserved. Stable left adrenal nodules. Previously felt to be adenomas. Tiny appendix cystic foci. Distended gallbladder. Please correlate with any symptoms. Musculoskeletal: Moderate degenerative changes of the spine with multiple bridging endplate osteophytes. IMPRESSION: Interval right upper lobectomy. Associated small right effusion. Node new dominant lung mass. Underlying chronic lung changes with emphysematous changes, scarring and fibrosis. There are several prominent nodes throughout the mediastinum and hila. These are not pathologic by size criteria today but are larger than previous examination. There is a differential. Recommend attention on follow-up in 3 months months. Aortic Atherosclerosis (ICD10-I70.0) and Emphysema (ICD10-J43.9). Electronically Signed   By: Karen Kays M.D.   On: 07/19/2023 15:24    ASSESSMENT AND PLAN: This is a very pleasant 73 years old white male diagnosed with stage IIb (T1c, N1, M0)  non-small cell lung cancer, poorly differentiated with sarcomatoid features presented with right upper lobe lung nodule in addition to right suprahilar nodal metastasis diagnosed in June 2024. The patient had previous imaging studies including MRI of the brain and PET scan that showed no other evidence of metastatic disease. He was seen by Dr. Dorris Fetch for surgical evaluation and he felt that the patient may benefit from a course of neoadjuvant chemoimmunotherapy first before consideration of surgical resection. He underwent a course of carboplatin for AUC of 6, paclitaxel 200 Mg/M2 and nivolumab 360 mg IV every 3 weeks with Neulasta support for 3 doses.  Status post 3 cycles.  He tolerated his previous treatment with immunotherapy fairly well and imaging studies showed partial response.  The patient underwent right upper lobectomy with lymph node dissection under the care of Dr. Dorris Fetch on March 07, 2023 and the final pathology showed no evidence for residual viable tumor. He declined adjuvant immunotherapy and currently on observation.    Non-small cell lung cancer (NSCLC) Stage IIB NSCLC, poorly differentiated with sarcomatoid features, diagnosed in June 2024. Underwent neoadjuvant chemoimmunotherapy with carboplatin, paclitaxel, and nivolumab for three cycles followed  by right upper lobectomy with lymph node dissection in November 2024. Recent scan shows no pathologic changes in cancer status, but slight enlargement of mediastinal and hilar lymph nodes, possibly due to recent upper respiratory infection. - Repeat scan in three months to monitor lymph node size  Upper respiratory infection Recent onset of cold symptoms including nasal drainage, cough with yellow sputum, and chills. No fever reported. Symptoms began two days before recent scan. Possible contributing factors include pollen exposure and concurrent illness in spouse. Slight enlargement of mediastinal and hilar lymph nodes on scan  may be related to infection. Decision made to prescribe antibiotics due to symptoms and lymph node changes. - Prescribe azithromycin to be sent to his pharmacy - Monitor symptoms and response to antibiotics  Elevated blood glucose Blood glucose level elevated at 136 mg/dL, possibly influenced by recent intake of coffee and creamer before blood draw. No immediate concern raised.  Follow-up Coordination of follow-up imaging and lab work to coincide with his schedule and family commitments. - Order lab work and scan for September 30, 2023, to coincide with his schedule   The patient was advised to call immediately if he has any other concerning symptoms in the interval. I discussed the assessment and treatment plan with the patient. The patient was provided an opportunity to ask questions and all were answered. The patient agreed with the plan and demonstrated an understanding of the instructions.   The patient was advised to call back or seek an in-person evaluation if the symptoms worsen or if the condition fails to improve as anticipated.  I provided 30 minutes of non face-to-face telephone visit time during this encounter, and > 50% was spent counseling as documented under my assessment & plan.  Lajuana Matte, MD 07/22/2023 11:04 AM  Disclaimer: This note was dictated with voice recognition software. Similar sounding words can inadvertently be transcribed and may not be corrected upon review.

## 2023-08-16 DIAGNOSIS — H401131 Primary open-angle glaucoma, bilateral, mild stage: Secondary | ICD-10-CM | POA: Diagnosis not present

## 2023-08-20 ENCOUNTER — Other Ambulatory Visit: Payer: Self-pay | Admitting: Family Medicine

## 2023-08-20 DIAGNOSIS — E782 Mixed hyperlipidemia: Secondary | ICD-10-CM

## 2023-08-20 MED ORDER — ROSUVASTATIN CALCIUM 20 MG PO TABS: 20.0000 mg | ORAL_TABLET | Freq: Every day | ORAL | 0 refills | Status: DC

## 2023-08-21 ENCOUNTER — Other Ambulatory Visit: Payer: Self-pay | Admitting: Family Medicine

## 2023-08-21 DIAGNOSIS — E782 Mixed hyperlipidemia: Secondary | ICD-10-CM

## 2023-08-21 MED ORDER — ROSUVASTATIN CALCIUM 20 MG PO TABS: 20.0000 mg | ORAL_TABLET | Freq: Every day | ORAL | 0 refills | Status: DC

## 2023-09-17 ENCOUNTER — Encounter (HOSPITAL_BASED_OUTPATIENT_CLINIC_OR_DEPARTMENT_OTHER): Payer: Self-pay | Admitting: Cardiology

## 2023-09-17 ENCOUNTER — Encounter: Payer: Self-pay | Admitting: Thoracic Surgery (Cardiothoracic Vascular Surgery)

## 2023-09-17 DIAGNOSIS — R1013 Epigastric pain: Secondary | ICD-10-CM

## 2023-09-17 DIAGNOSIS — K219 Gastro-esophageal reflux disease without esophagitis: Secondary | ICD-10-CM

## 2023-09-17 DIAGNOSIS — K2970 Gastritis, unspecified, without bleeding: Secondary | ICD-10-CM

## 2023-09-17 MED ORDER — PANTOPRAZOLE SODIUM 40 MG PO TBEC: 40.0000 mg | DELAYED_RELEASE_TABLET | Freq: Two times a day (BID) | ORAL | 0 refills | Status: DC

## 2023-09-25 ENCOUNTER — Telehealth: Payer: Self-pay

## 2023-09-25 NOTE — Telephone Encounter (Signed)
 Pt is currently seeing Oncology and at the [facility] for the summer. Wife states he will likely make a appt in the fall.

## 2023-09-30 ENCOUNTER — Inpatient Hospital Stay: Attending: Physician Assistant

## 2023-09-30 ENCOUNTER — Ambulatory Visit (HOSPITAL_COMMUNITY)
Admission: RE | Admit: 2023-09-30 | Discharge: 2023-09-30 | Disposition: A | Discharge status: Home / Self Care | Source: Ambulatory Visit | Attending: Internal Medicine | Admitting: Internal Medicine

## 2023-09-30 ENCOUNTER — Telehealth: Payer: Self-pay | Admitting: *Deleted

## 2023-09-30 DIAGNOSIS — J432 Centrilobular emphysema: Secondary | ICD-10-CM | POA: Diagnosis not present

## 2023-09-30 DIAGNOSIS — C3490 Malignant neoplasm of unspecified part of unspecified bronchus or lung: Secondary | ICD-10-CM | POA: Insufficient documentation

## 2023-09-30 DIAGNOSIS — J90 Pleural effusion, not elsewhere classified: Secondary | ICD-10-CM | POA: Diagnosis not present

## 2023-09-30 DIAGNOSIS — Z79899 Other long term (current) drug therapy: Secondary | ICD-10-CM | POA: Insufficient documentation

## 2023-09-30 DIAGNOSIS — I700 Atherosclerosis of aorta: Secondary | ICD-10-CM | POA: Diagnosis not present

## 2023-09-30 DIAGNOSIS — C3411 Malignant neoplasm of upper lobe, right bronchus or lung: Secondary | ICD-10-CM | POA: Insufficient documentation

## 2023-09-30 DIAGNOSIS — J4489 Other specified chronic obstructive pulmonary disease: Secondary | ICD-10-CM | POA: Diagnosis not present

## 2023-09-30 DIAGNOSIS — Z85820 Personal history of malignant melanoma of skin: Secondary | ICD-10-CM | POA: Diagnosis not present

## 2023-09-30 LAB — CBC WITH DIFFERENTIAL (CANCER CENTER ONLY)
Abs Immature Granulocytes: 0.03 10*3/uL (ref 0.00–0.07)
Basophils Absolute: 0.1 10*3/uL (ref 0.0–0.1)
Basophils Relative: 1 %
Eosinophils Absolute: 0.6 10*3/uL — ABNORMAL HIGH (ref 0.0–0.5)
Eosinophils Relative: 6 %
HCT: 44 % (ref 39.0–52.0)
Hemoglobin: 14.9 g/dL (ref 13.0–17.0)
Immature Granulocytes: 0 %
Lymphocytes Relative: 21 %
Lymphs Abs: 2 10*3/uL (ref 0.7–4.0)
MCH: 29.5 pg (ref 26.0–34.0)
MCHC: 33.9 g/dL (ref 30.0–36.0)
MCV: 87.1 fL (ref 80.0–100.0)
Monocytes Absolute: 1 10*3/uL (ref 0.1–1.0)
Monocytes Relative: 11 %
Neutro Abs: 5.7 10*3/uL (ref 1.7–7.7)
Neutrophils Relative %: 61 %
Platelet Count: 287 10*3/uL (ref 150–400)
RBC: 5.05 MIL/uL (ref 4.22–5.81)
RDW: 13.2 % (ref 11.5–15.5)
WBC Count: 9.4 10*3/uL (ref 4.0–10.5)
nRBC: 0 % (ref 0.0–0.2)

## 2023-09-30 LAB — CMP (CANCER CENTER ONLY)
ALT: 21 U/L (ref 0–44)
AST: 17 U/L (ref 15–41)
Albumin: 4.9 g/dL (ref 3.5–5.0)
Alkaline Phosphatase: 62 U/L (ref 38–126)
Anion gap: 10 (ref 5–15)
BUN: 18 mg/dL (ref 8–23)
CO2: 26 mmol/L (ref 22–32)
Calcium: 9.7 mg/dL (ref 8.9–10.3)
Chloride: 104 mmol/L (ref 98–111)
Creatinine: 0.95 mg/dL (ref 0.61–1.24)
GFR, Estimated: >60 mL/min (ref >60)
Glucose, Bld: 112 mg/dL — ABNORMAL HIGH (ref 70–99)
Potassium: 3.7 mmol/L (ref 3.5–5.1)
Sodium: 140 mmol/L (ref 135–145)
Total Bilirubin: 0.8 mg/dL (ref 0.0–1.2)
Total Protein: 8.2 g/dL — ABNORMAL HIGH (ref 6.5–8.1)

## 2023-09-30 MED ORDER — IOHEXOL 300 MG/ML  SOLN
75.0000 mL | Freq: Once | INTRAMUSCULAR | Status: AC | PRN
  Administered 2023-09-30: 75 mL via INTRAVENOUS

## 2023-09-30 NOTE — Telephone Encounter (Signed)
 I have left a message and sent a mychart message to patient to see about getting an appointment scheduled to see if provider will fill before the appointment or night.

## 2023-09-30 NOTE — Telephone Encounter (Signed)
-----   Message from RMA Katrina M sent at 09/25/2023  9:22 AM EDT ----- Patient needs appt for this refill ----- Message ----- From: Tivis Forster, Rainee Sweatt D, CMA Sent: 09/23/2023   1:24 PM EDT To: [facility]

## 2023-09-30 NOTE — Telephone Encounter (Signed)
 LVM to call office to see about getting an appointment scheduled.  We received a refill request for his timolol , he has not had an appointment since 06/21/22, we must get an appt scheduled and then we can see if provider will fill till the appt.

## 2023-10-09 ENCOUNTER — Inpatient Hospital Stay (HOSPITAL_BASED_OUTPATIENT_CLINIC_OR_DEPARTMENT_OTHER): Admitting: Internal Medicine

## 2023-10-09 DIAGNOSIS — Z79899 Other long term (current) drug therapy: Secondary | ICD-10-CM | POA: Diagnosis not present

## 2023-10-09 DIAGNOSIS — C3411 Malignant neoplasm of upper lobe, right bronchus or lung: Secondary | ICD-10-CM | POA: Diagnosis not present

## 2023-10-09 DIAGNOSIS — J432 Centrilobular emphysema: Secondary | ICD-10-CM | POA: Diagnosis not present

## 2023-10-09 DIAGNOSIS — C3490 Malignant neoplasm of unspecified part of unspecified bronchus or lung: Secondary | ICD-10-CM | POA: Diagnosis not present

## 2023-10-09 DIAGNOSIS — J4489 Other specified chronic obstructive pulmonary disease: Secondary | ICD-10-CM | POA: Diagnosis not present

## 2023-10-09 DIAGNOSIS — Z85820 Personal history of malignant melanoma of skin: Secondary | ICD-10-CM | POA: Diagnosis not present

## 2023-10-09 NOTE — Progress Notes (Signed)
 [facility] [phone]   [phone]  PROGRESS NOTE FOR TELEMEDICINE VISITS  Brandon Schultz LABOR, PA [address] [place] [address]  I connected withNAME@ on 10/09/23 at  9:00 AM EDT by video enabled telemedicine visit and verified that I am speaking with the correct person using two identifiers.   I discussed the limitations, risks, security and privacy concerns of performing an evaluation and management service by telemedicine and the availability of in-person appointments. I also discussed with the patient that there may be a patient responsible charge related to this service. The patient expressed understanding and agreed to proceed.  Other persons participating in the visit and their role in the encounter:  Wife  Patient's location: Home Provider's location: [facility]  DIAGNOSIS: Stage IIb (T1c, N1, M0) non-small cell lung cancer, poorly differentiated with sarcomatoid features diagnosed in June 2024.   PRIOR THERAPY:  1) Neoadjuvant chemoimmunotherapy with carboplatin  for AUC of 6, paclitaxel  200 Mg/M2 and nivolumab  360 mg IV every 3 weeks with Neulasta  support.  First dose 11/21/2022.  Status post 3 cycles.  2) status post robotic assisted right upper lobectomy with lymph node dissection under the care of Dr. Kerrin on March 07, 2023.  The final pathology showed no residual disease after the neoadjuvant chemoimmunotherapy.   CURRENT THERAPY: Observation  INTERVAL HISTORY: Brandon Schultz 73 y.o. male has a MyChart virtual video visit with me today for evaluation and discussion of his recent scan results.Discussed the use of AI scribe software for clinical note transcription with the patient, who gave verbal consent to proceed.  History of Present Illness   [name] is a 73 year old male with stage two B non-small cell lung cancer who presents for a virtual video visit to discuss recent CT scan  results.  He has a history of stage two B non-small cell lung cancer, poorly differentiated with sarcomatoid features, diagnosed in June 2024. He underwent neoadjuvant chemoimmunotherapy with carboplatin , paclitaxel , and nivolumab  for three cycles, followed by a right upper lobectomy with lymph node dissection in November 2024. The resection showed no evidence of residual viable tumor. He is currently under observation and had a recent CT scan of the chest.  He feels 'pretty good' overall but has a persistent cough and a sensation of having bronchitis, describing it as 'coughing up a bunch of crap.' He received a Z-Pak approximately a month ago, which has helped somewhat, but the cough persists. He does not have a family doctor in the area and relies on a local prime care facility for medical needs.  He has COPD and emphysema. He wants to 'get out and do some walking and get some exercise' but is limited by a 'bad knee' that may require joint replacement.  He notes that he has regained his hair and mustache post-treatment, but his hearing has not fully returned.       MEDICAL HISTORY: Past Medical History:  Diagnosis Date   Allergy    seasonal   Arthritis    Asthma    Cervical stenosis of spine    Colon polyps    Complication of anesthesia    COPD (chronic obstructive pulmonary disease) (HCC)    Diverticulitis    Emphysema    GERD (gastroesophageal reflux disease)    Gum disease    H/O degenerative disc disease    Hemorrhoids    HOH (hard of hearing)    Inguinal hernia  Melanoma (HCC)    facial   PONV (postoperative nausea and vomiting)    Renal disease    Bright's Disease-childhood   Syncope    Umbilical hernia    Urinary tract infection    Von Willebrand disease (HCC)     ALLERGIES:  has no known allergies.  MEDICATIONS:  Current Outpatient Medications  Medication Sig Dispense Refill   azithromycin  (ZITHROMAX ) 250 MG tablet Use as instructed 6 each 0   pantoprazole   (PROTONIX ) 40 MG tablet Take 1 tablet (40 mg total) by mouth 2 (two) times daily. 60 tablet 0   rosuvastatin  (CRESTOR ) 20 MG tablet Take 1 tablet (20 mg total) by mouth daily. 90 tablet 0   timolol  (TIMOPTIC ) 0.5 % ophthalmic solution Place 1 drop into the left eye every morning.     No current facility-administered medications for this visit.    SURGICAL HISTORY:  Past Surgical History:  Procedure Laterality Date   BRONCHIAL BIOPSY  09/25/2022   Procedure: BRONCHIAL BIOPSIES;  Surgeon: Shelah Lamar RAMAN, MD;  Location: [facility] ENDOSCOPY;  Service: Pulmonary;;   BRONCHIAL BRUSHINGS  09/25/2022   Procedure: BRONCHIAL BRUSHINGS;  Surgeon: Shelah Lamar RAMAN, MD;  Location: [facility] ENDOSCOPY;  Service: Pulmonary;;   BRONCHIAL NEEDLE ASPIRATION BIOPSY  09/25/2022   Procedure: BRONCHIAL NEEDLE ASPIRATION BIOPSIES;  Surgeon: Shelah Lamar RAMAN, MD;  Location: [facility] ENDOSCOPY;  Service: Pulmonary;;   cataract surgery Bilateral 11/2020   COLONOSCOPY  06/08/2021   07/27/2016   DENTAL SURGERY     FIDUCIAL MARKER PLACEMENT  09/25/2022   Procedure: FIDUCIAL MARKER PLACEMENT;  Surgeon: Shelah Lamar RAMAN, MD;  Location: [facility] ENDOSCOPY;  Service: Pulmonary;;   HERNIA REPAIR     Umbilical & Inguinal   INTERCOSTAL NERVE BLOCK Right 03/07/2023   Procedure: INTERCOSTAL NERVE BLOCK, RIGHT CHEST;  Surgeon: Kerrin Elspeth BROCKS, MD;  Location: [facility] OR;  Service: Thoracic;  Laterality: Right;   left knee surgery     LYMPH NODE DISSECTION Right 03/07/2023   Procedure: LYMPH NODE DISSECTION, RIGHT LUNG;  Surgeon: Kerrin Elspeth BROCKS, MD;  Location: [facility] OR;  Service: Thoracic;  Laterality: Right;   MELANOMA EXCISION     facial   neck and shoulder injection     POLYPECTOMY     WISDOM TOOTH EXTRACTION      REVIEW OF SYSTEMS:  Constitutional: positive for fatigue Eyes: negative Ears, nose, mouth, throat, and face: negative Respiratory: positive for cough, dyspnea on exertion, and sputum Cardiovascular: negative Gastrointestinal:  negative Genitourinary:negative Integument/breast: negative Hematologic/lymphatic: negative Musculoskeletal:positive for arthralgias Neurological: negative Behavioral/Psych: negative Endocrine: negative Allergic/Immunologic: negative     LABORATORY DATA: Lab Results  Component Value Date   WBC 9.4 09/30/2023   HGB 14.9 09/30/2023   HCT 44.0 09/30/2023   MCV 87.1 09/30/2023   PLT 287 09/30/2023      Chemistry      Component Value Date/Time   NA 140 09/30/2023 0854   NA 140 06/14/2022 0904   K 3.7 09/30/2023 0854   CL 104 09/30/2023 0854   CO2 26 09/30/2023 0854   BUN 18 09/30/2023 0854   BUN 21 06/14/2022 0904   CREATININE 0.95 09/30/2023 0854   CREATININE 0.92 02/18/2013 1431      Component Value Date/Time   CALCIUM  9.7 09/30/2023 0854   ALKPHOS 62 09/30/2023 0854   AST 17 09/30/2023 0854   ALT 21 09/30/2023 0854   BILITOT 0.8 09/30/2023 0854       RADIOGRAPHIC STUDIES: CT Chest W Contrast Result Date: 09/30/2023  CLINICAL DATA:  Non-small-cell lung cancer.  Restaging. EXAM: CT CHEST WITH CONTRAST TECHNIQUE: Multidetector CT imaging of the chest was performed during intravenous contrast administration. RADIATION DOSE REDUCTION: This exam was performed according to the departmental dose-optimization program which includes automated exposure control, adjustment of the mA and/or kV according to patient size and/or use of iterative reconstruction technique. CONTRAST:  75mL OMNIPAQUE  IOHEXOL  300 MG/ML  SOLN COMPARISON:  01/15/2023 FINDINGS: Cardiovascular: The heart size is normal. No substantial pericardial effusion. Coronary artery calcification is evident. Mild atherosclerotic calcification is noted in the wall of the thoracic aorta. Mediastinum/Nodes: No mediastinal lymphadenopathy. There is no hilar lymphadenopathy. The esophagus has normal imaging features. There is no axillary lymphadenopathy. Lungs/Pleura: Centrilobular and paraseptal emphysema evident. Status post  right upper lobectomy. Bilateral lower lung predominant cylindrical bronchiectasis is similar to prior. Small right pleural effusion seen on the previous study has nearly resolved. Peripheral scarring noted right lung base with subpleural reticulation bilaterally raising concern for underlying chronic interstitial lung disease. No new overtly suspicious pulmonary nodule or mass. No pulmonary edema. Upper Abdomen: Tiny hypodensity lateral segment left liver is too small to characterize but stable in the interval consistent with benign etiology. No followup imaging is recommended. Nodular thickening in the left adrenal gland is stable. Exophytic water density lesion upper pole right kidney is similar to prior although incompletely visualized. This has features compatible with a simple cyst. No followup imaging is recommended. Musculoskeletal: No worrisome lytic or sclerotic osseous abnormality. IMPRESSION: 1. Status post right upper lobectomy. No evidence for recurrent or metastatic disease in the chest. 2. Small right pleural effusion seen on the previous study has nearly resolved. 3. Bilateral lower lung predominant cylindrical bronchiectasis is similar to prior. 4. Subpleural reticulation bilaterally raising concern for underlying chronic interstitial lung disease. 5. Aortic Atherosclerosis (ICD10-I70.0) and Emphysema (ICD10-J43.9). Electronically Signed   By: Camellia Candle M.D.   On: 09/30/2023 12:30    ASSESSMENT AND PLAN:  This is a very pleasant 73 years old white male diagnosed with stage IIb (T1c, N1, M0) non-small cell lung cancer, poorly differentiated with sarcomatoid features presented with right upper lobe lung nodule in addition to right suprahilar nodal metastasis diagnosed in June 2024. The patient had previous imaging studies including MRI of the brain and PET scan that showed no other evidence of metastatic disease. He was seen by Dr. Kerrin for surgical evaluation and he felt that the  patient may benefit from a course of neoadjuvant chemoimmunotherapy first before consideration of surgical resection. He underwent a course of carboplatin  for AUC of 6, paclitaxel  200 Mg/M2 and nivolumab  360 mg IV every 3 weeks with Neulasta  support for 3 doses.  Status post 3 cycles.  He tolerated his previous treatment with immunotherapy fairly well and imaging studies showed partial response.  The patient underwent right upper lobectomy with lymph node dissection under the care of Dr. Kerrin on March 07, 2023 and the final pathology showed no evidence for residual viable tumor. He declined adjuvant immunotherapy and currently on observation. He had repeat CT scan of the chest performed recently.  I personally and independently reviewed the scan and discussed the result with the patient and his wife.  His scan showed no concerning findings for disease recurrence but he has some evidence of bronchiectasis. Assessment and Plan    Non-small cell lung cancer Stage IIB non-small cell lung cancer, poorly differentiated with sarcomatoid features, status post neoadjuvant chemoimmunotherapy and right upper lobectomy with lymph node dissection. Recent CT  scan shows no evidence of recurrence. - Schedule follow-up CT scan in six months.  Chronic Obstructive Pulmonary Disease (COPD) with emphysema COPD with emphysema identified on recent CT scan. Reports persistent cough and history of bronchitis, treated with azithromycin  approximately one month ago. No primary care physician in the area. Requires pulmonology care for ongoing management. - Refer to pulmonologist for ongoing management of COPD and emphysema.  Knee pain with potential need for joint replacement Knee pain limiting physical activity, with potential need for joint replacement.   He was advised to call immediately if he has any concerning symptoms in the interval.  I discussed the assessment and treatment plan with the patient. The patient  was provided an opportunity to ask questions and all were answered. The patient agreed with the plan and demonstrated an understanding of the instructions.   The patient was advised to call back or seek an in-person evaluation if the symptoms worsen or if the condition fails to improve as anticipated.  I provided 30 minutes of non face-to-face telephone visit time during this encounter, and > 50% was spent counseling as documented under my assessment & plan.  Sherrod MARLA Sherrod, MD 10/09/2023 9:38 AM  Disclaimer: This note was dictated with voice recognition software. Similar sounding words can inadvertently be transcribed and may not be corrected upon review.

## 2023-10-17 ENCOUNTER — Ambulatory Visit: Payer: Self-pay

## 2023-10-17 ENCOUNTER — Other Ambulatory Visit: Payer: Self-pay | Admitting: Family Medicine

## 2023-10-17 ENCOUNTER — Telehealth: Payer: Self-pay | Admitting: Cardiology

## 2023-10-17 ENCOUNTER — Encounter: Payer: Self-pay | Admitting: Internal Medicine

## 2023-10-17 ENCOUNTER — Other Ambulatory Visit: Payer: Self-pay

## 2023-10-17 DIAGNOSIS — R1013 Epigastric pain: Secondary | ICD-10-CM

## 2023-10-17 DIAGNOSIS — C3490 Malignant neoplasm of unspecified part of unspecified bronchus or lung: Secondary | ICD-10-CM

## 2023-10-17 DIAGNOSIS — K219 Gastro-esophageal reflux disease without esophagitis: Secondary | ICD-10-CM

## 2023-10-17 DIAGNOSIS — K2970 Gastritis, unspecified, without bleeding: Secondary | ICD-10-CM

## 2023-10-17 MED ORDER — PANTOPRAZOLE SODIUM 40 MG PO TBEC: 40.0000 mg | DELAYED_RELEASE_TABLET | Freq: Two times a day (BID) | ORAL | 4 refills | Status: DC

## 2023-10-17 NOTE — Telephone Encounter (Signed)
 Contacted pt wife and informed her that the requested medication has been sent in from the cardiology office.

## 2023-10-17 NOTE — Telephone Encounter (Signed)
  FYI Only or Action Required?: Action required by provider: medication refill request and clinical question for provider.  Patient was last seen in primary care on unknown. Called Nurse Triage reporting Medication Refill and Advice Only. Symptoms began N/A. Interventions attempted: Other: N/A. Symptoms are: N/A.  Triage Disposition: No disposition on file.  Patient/caregiver understands and will follow disposition?:                               Copied from CRM [id number]. Topic: Clinical - Medication Question >> Oct 17, 2023 10:08 AM Tobias CROME wrote: Reason for CRM: Wife inquiring what patient can take over the counter if he runs out of pantoprazole . Wife submitted refill request and advised of turnaround time.   Wife seeking clinical advice, [phone] Reason for Disposition  [1] Prescription refill request for NON-ESSENTIAL medicine (i.e., no harm to patient if med not taken) AND [2] triager unable to refill per department policy  Answer Assessment - Initial Assessment Questions 1. DRUG NAME: What medicine do you need to have refilled?     Pantoprazole  40 MG 2. REFILLS REMAINING: How many refills are remaining? (Note: The label on the medicine or pill bottle will show how many refills are remaining. If there are no refills remaining, then a renewal may be needed.)     None 4. PRESCRIBING HCP: Who prescribed it? Reason: If prescribed by specialist, call should be referred to that group.     Patient's cardiologist- patient's wife stated both herself and the pharmacy have called the cardiologist office multiple times 5. SYMPTOMS: Do you have any symptoms?     No    Patient's wife stated patient will run out of pantoprazole  40 MG on Saturday. Wife is hoping that PCP office can refill this medication because she has not been able to get in contact with cardiologist office. If not, wife is seeking provider's recommendation of a medication the patient can take  in place of pantoprazole .  Protocols used: Medication Refill and Renewal Call-A-AH

## 2023-10-17 NOTE — Telephone Encounter (Signed)
 Pt's medication was sent to pt's pharmacy as requested. Confirmation received.

## 2023-10-17 NOTE — Telephone Encounter (Signed)
 Copied from CRM [id number]. Topic: Clinical - Medication Refill >> Oct 17, 2023 10:04 AM Carla L wrote: Medication: pantoprazole  (PROTONIX ) 40 MG tablet Patient close to running out, requesting for primary care at [facility] to fill until patient is able to be seen. Patient has upcoming appointment in November to establish care w/ Saddie Sacks, PA. Patient requesting this be filled as soon as possible, advised of turnaround time.   Has the patient contacted their pharmacy? Yes Told to contact office.   This is the patient's preferred pharmacy:  [facility] [address] [place], [place] - [address] [place] [address] Phone: [phone] Fax: [phone]  Is this the correct pharmacy for this prescription? Yes  Has the prescription been filled recently? No  Is the patient out of the medication? No, will be out Sunday morning.   Has the patient been seen for an appointment in the last year OR does the patient have an upcoming appointment? No  Can we respond through MyChart? Yes  Agent: Please be advised that Rx refills may take up to 3 business days. We ask that you follow-up with your pharmacy.

## 2023-10-17 NOTE — Telephone Encounter (Signed)
*  STAT* If patient is at the pharmacy, call can be transferred to refill team.   1. Which medications need to be refilled? (please list name of each medication and dose if known) pantoprazole  (PROTONIX ) 40 MG tablet   2. Which pharmacy/location (including street and city if local pharmacy) is medication to be sent to?  [facility] [address]- [address], [place] - [place] - [address] [place] [phone]   3. Do they need a 30 day or 90 day supply? -

## 2023-10-22 ENCOUNTER — Ambulatory Visit
Attending: Thoracic Surgery (Cardiothoracic Vascular Surgery) | Admitting: Thoracic Surgery (Cardiothoracic Vascular Surgery)

## 2023-10-22 DIAGNOSIS — C3411 Malignant neoplasm of upper lobe, right bronchus or lung: Secondary | ICD-10-CM

## 2023-10-22 NOTE — Progress Notes (Signed)
 [address]       [place] [address]             [phone]      This visit was conducted by telephone due to patient preference.  Patient location: Home MD location: Office  HPI: Brandon Schultz is a 73 year old man status post right upper lobectomy for stage Ia lung cancer.  Brandon Schultz is a 73 year old man with a history of tobacco use, COPD, lung cancer, right upper lobectomy, coronary calcification, hyperlipidemia, melanoma, von Willebrand's, CKD, reflux, gout, and arthritis.  Have a lung nodule in March 2024.  Bronchoscopy showed non-small cell carcinoma.  He had clinical stage IIb disease due to hilar adenopathy on PET.  He underwent neoadjuvant chemoimmunotherapy by Dr. Gatha.  He had a good radiographic response.  He underwent robotic assisted right upper lobectomy in November 2024.  Pathology showed no residual viable tumor.  I last saw him in the office in January.  He was doing well at that time.  Was having some issues with peripheral neuropathy and some mild surgical pain.  Was not taking narcotics.  Currently feels well.  He denies any significant pain related to his incision.  Does have a persistent cough with clear sputum production.  He was treated with a Z-Pak without any significant change.  Is awaiting an appointment with pulmonary.  Past Medical History:  Diagnosis Date   Allergy    seasonal   Arthritis    Asthma    Cervical stenosis of spine    Colon polyps    Complication of anesthesia    COPD (chronic obstructive pulmonary disease) (HCC)    Diverticulitis    Emphysema    GERD (gastroesophageal reflux disease)    Gum disease    H/O degenerative disc disease    Hemorrhoids    HOH (hard of hearing)    Inguinal hernia    Melanoma (HCC)    facial   PONV (postoperative nausea and vomiting)    Renal disease    Bright's Disease-childhood   Syncope    Umbilical hernia    Urinary tract infection    Von Willebrand  disease (HCC)     Current Outpatient Medications  Medication Sig Dispense Refill   azithromycin  (ZITHROMAX ) 250 MG tablet Use as instructed 6 each 0   pantoprazole  (PROTONIX ) 40 MG tablet Take 1 tablet (40 mg total) by mouth 2 (two) times daily. 60 tablet 4   rosuvastatin  (CRESTOR ) 20 MG tablet Take 1 tablet (20 mg total) by mouth daily. 90 tablet 0   timolol  (TIMOPTIC ) 0.5 % ophthalmic solution Place 1 drop into the left eye every morning.     No current facility-administered medications for this visit.    Physical Exam No physical exam-telephone visit  Diagnostic Tests: CT CHEST WITH CONTRAST   TECHNIQUE: Multidetector CT imaging of the chest was performed during intravenous contrast administration.   RADIATION DOSE REDUCTION: This exam was performed according to the departmental dose-optimization program which includes automated exposure control, adjustment of the mA and/or kV according to patient size and/or use of iterative reconstruction technique.   CONTRAST:  75mL OMNIPAQUE  IOHEXOL  300 MG/ML  SOLN   COMPARISON:  01/15/2023   FINDINGS: Cardiovascular: The heart size is normal. No substantial pericardial effusion. Coronary artery calcification is evident. Mild atherosclerotic calcification is noted in the wall of the thoracic aorta.   Mediastinum/Nodes: No mediastinal lymphadenopathy. There is no hilar lymphadenopathy. The esophagus has normal  imaging features. There is no axillary lymphadenopathy.   Lungs/Pleura: Centrilobular and paraseptal emphysema evident. Status post right upper lobectomy. Bilateral lower lung predominant cylindrical bronchiectasis is similar to prior. Small right pleural effusion seen on the previous study has nearly resolved. Peripheral scarring noted right lung base with subpleural reticulation bilaterally raising concern for underlying chronic interstitial lung disease. No new overtly suspicious pulmonary nodule or mass. No pulmonary  edema.   Upper Abdomen: Tiny hypodensity lateral segment left liver is too small to characterize but stable in the interval consistent with benign etiology. No followup imaging is recommended. Nodular thickening in the left adrenal gland is stable. Exophytic water density lesion upper pole right kidney is similar to prior although incompletely visualized. This has features compatible with a simple cyst. No followup imaging is recommended.   Musculoskeletal: No worrisome lytic or sclerotic osseous abnormality.   IMPRESSION: 1. Status post right upper lobectomy. No evidence for recurrent or metastatic disease in the chest. 2. Small right pleural effusion seen on the previous study has nearly resolved. 3. Bilateral lower lung predominant cylindrical bronchiectasis is similar to prior. 4. Subpleural reticulation bilaterally raising concern for underlying chronic interstitial lung disease. 5. Aortic Atherosclerosis (ICD10-I70.0) and Emphysema (ICD10-J43.9).     Electronically Signed   By: Camellia Candle M.D.   On: 09/30/2023 12:30   I personally reviewed the CT images.  Status post right upper lobectomy.  No evidence of recurrent mass or adenopathy.  Bronchiectasis and subpleural reticulation bilaterally.  Impression: Brandon Schultz is a 73 year old man with a history of tobacco use, COPD, lung cancer, right upper lobectomy, coronary calcification, hyperlipidemia, melanoma, von Willebrand's, CKD, reflux, gout, and arthritis.  Status post right upper lobectomy-now about 6 months out from surgery.  Not having any significant issues related to his surgery.  Stage IIb non-small cell carcinoma-neoadjuvant chemoimmunotherapy followed by resection.  Declined continued immunotherapy.  Now 6 months out with no evidence of recurrent disease.  Will be followed by Dr. Sherrod.  Persistent cough-likely related to his bronchiectasis.  No waiting evaluation by  pulmonology.  Plan: Follow-up with Dr. Sherrod I will be happy to see Brandon Schultz at any time if I can be of any further assistance with his care  Brandon JAYSON Millers, MD [facility] [phone]

## 2023-11-19 ENCOUNTER — Telehealth: Payer: Self-pay

## 2023-11-19 NOTE — Telephone Encounter (Signed)
 Received a call from [facility] in [place], [place] regarding a referral received by their office. She was calling to confirm the referral, as the patient's address is listed as [place], [place]. Informed Pam that the referral is correct, as the patient had previously sent a MyChart message requesting a pulmonologist in [facility]. Pam voiced understanding.

## 2023-11-20 DIAGNOSIS — R053 Chronic cough: Secondary | ICD-10-CM | POA: Diagnosis not present

## 2023-11-20 DIAGNOSIS — J449 Chronic obstructive pulmonary disease, unspecified: Secondary | ICD-10-CM | POA: Diagnosis not present

## 2023-11-20 DIAGNOSIS — J471 Bronchiectasis with (acute) exacerbation: Secondary | ICD-10-CM | POA: Diagnosis not present

## 2023-11-27 DIAGNOSIS — J471 Bronchiectasis with (acute) exacerbation: Secondary | ICD-10-CM | POA: Diagnosis not present

## 2023-11-28 ENCOUNTER — Encounter: Payer: Self-pay | Admitting: Internal Medicine

## 2023-11-28 DIAGNOSIS — J471 Bronchiectasis with (acute) exacerbation: Secondary | ICD-10-CM | POA: Diagnosis not present

## 2023-11-28 DIAGNOSIS — R059 Cough, unspecified: Secondary | ICD-10-CM | POA: Diagnosis not present

## 2023-11-29 ENCOUNTER — Telehealth: Payer: Self-pay | Admitting: Medical Oncology

## 2023-11-29 NOTE — Telephone Encounter (Signed)
 Pt requested to send CD images of last Ct scan to Dr Norberta Coral, MD Pulmonologist  @ [facility], [place].    LVM to return my call @ phone number  [phone].

## 2023-12-05 ENCOUNTER — Encounter: Payer: Self-pay | Admitting: Internal Medicine

## 2023-12-19 ENCOUNTER — Ambulatory Visit (INDEPENDENT_AMBULATORY_CARE_PROVIDER_SITE_OTHER)

## 2023-12-19 DIAGNOSIS — Z0000 Encounter for general adult medical examination without abnormal findings: Secondary | ICD-10-CM

## 2023-12-19 NOTE — Progress Notes (Signed)
 Subjective:   Brandon Schultz is a 73 y.o. who presents for a Medicare Wellness preventive visit.  As a reminder, Annual Wellness Visits don't include a physical exam, and some assessments may be limited, especially if this visit is performed virtually. We may recommend an in-person follow-up visit with your provider if needed.  Visit Complete: Virtual I connected with  [name] on 12/19/23 by a video and audio enabled telemedicine application and verified that I am speaking with the correct person using two identifiers.  Patient Location: Home  Provider Location: Home Office  I discussed the limitations of evaluation and management by telemedicine. The patient expressed understanding and agreed to proceed.  Vital Signs: Because this visit was a virtual/telehealth visit, some criteria may be missing or patient reported. Any vitals not documented were not able to be obtained and vitals that have been documented are patient reported.    Persons Participating in Visit: Patient.  AWV Questionnaire: Yes: Patient Medicare AWV questionnaire was completed by the patient on 12/18/2023; I have confirmed that all information answered by patient is correct and no changes since this date.  Cardiac Risk Factors include: advanced age (>44men, >74 women);dyslipidemia;male gender     Objective:    Today's Vitals   12/19/23 1333  PainSc: 2    There is no height or weight on file to calculate BMI.     12/19/2023    1:38 PM 03/07/2023    2:00 PM 03/05/2023    5:13 PM 09/25/2022    7:56 AM 06/04/2022   10:45 AM 07/27/2016    8:43 AM 07/06/2016   10:56 AM  Advanced Directives  Does Patient Have a Medical Advance Directive? Yes No No Yes No Yes  Yes   Type of Estate agent of [place];Living will   Living will  Healthcare Power of [place];Living will Healthcare Power of [place];Living will  Copy of Healthcare Power of Attorney in Chart? No - copy requested         Would patient like information on creating a medical advance directive?  No - Patient declined Yes (ED - Information included in AVS)  No - Patient declined       Data saved with a previous flowsheet row definition    Current Medications (verified) Outpatient Encounter Medications as of 12/19/2023  Medication Sig   pantoprazole  (PROTONIX ) 40 MG tablet Take 1 tablet (40 mg total) by mouth 2 (two) times daily.   rosuvastatin  (CRESTOR ) 20 MG tablet Take 1 tablet (20 mg total) by mouth daily.   timolol  (TIMOPTIC ) 0.5 % ophthalmic solution Place 1 drop into the left eye every morning.   azithromycin  (ZITHROMAX ) 250 MG tablet Use as instructed   No facility-administered encounter medications on file as of 12/19/2023.    Allergies (verified) Patient has no known allergies.   History: Past Medical History:  Diagnosis Date   Allergy    seasonal   Arthritis    Asthma    Cervical stenosis of spine    Colon polyps    Complication of anesthesia    COPD (chronic obstructive pulmonary disease) (HCC)    Diverticulitis    Emphysema    GERD (gastroesophageal reflux disease)    Gum disease    H/O degenerative disc disease    Hemorrhoids    HOH (hard of hearing)    Inguinal hernia    Melanoma (HCC)    facial   PONV (postoperative nausea and vomiting)    Renal disease  Bright's Disease-childhood   Syncope    Umbilical hernia    Urinary tract infection    Von Willebrand disease [facility])    Past Surgical History:  Procedure Laterality Date   BRONCHIAL BIOPSY  09/25/2022   Procedure: BRONCHIAL BIOPSIES;  Surgeon: Shelah Lamar RAMAN, MD;  Location: [facility] ENDOSCOPY;  Service: Pulmonary;;   BRONCHIAL BRUSHINGS  09/25/2022   Procedure: BRONCHIAL BRUSHINGS;  Surgeon: Shelah Lamar RAMAN, MD;  Location[facility] ENDOSCOPY;  Service: Pulmonary;;   BRONCHIAL NEEDLE ASPIRATION BIOPSY  09/25/2022   Procedure: BRONCHIAL NEEDLE ASPIRATION BIOPSIES;  Surgeon: Shelah Lamar RAMAN, MD;  Location: [facility] ENDOSCOPY;  Service:  Pulmonary;;   cataract surgery Bilateral 11/2020   COLONOSCOPY  06/08/2021   07/27/2016   DENTAL SURGERY     FIDUCIAL MARKER PLACEMENT  09/25/2022   Procedure: FIDUCIAL MARKER PLACEMENT;  Surgeon: Shelah Lamar RAMAN, MD;  Location: [facility] ENDOSCOPY;  Service: Pulmonary;;   HERNIA REPAIR     Umbilical & Inguinal   INTERCOSTAL NERVE BLOCK Right 03/07/2023   Procedure: INTERCOSTAL NERVE BLOCK, RIGHT CHEST;  Surgeon: Kerrin Elspeth BROCKS, MD;  Location: [facility] OR;  Service: Thoracic;  Laterality: Right;   left knee surgery     LYMPH NODE DISSECTION Right 03/07/2023   Procedure: LYMPH NODE DISSECTION, RIGHT LUNG;  Surgeon: Kerrin Elspeth BROCKS, MD;  Location: [facility] OR;  Service: Thoracic;  Laterality: Right;   MELANOMA EXCISION     facial   neck and shoulder injection     POLYPECTOMY     WISDOM TOOTH EXTRACTION     Family History  Problem Relation Age of Onset   Colon cancer Mother    Heart attack Father        Deceased-Early 57s   Syncope episode Sister    Healthy Sister        x1   Dementia Maternal Grandmother    Cancer Maternal Grandfather    Healthy Daughter        x2   Healthy Son        x2   Colon polyps Neg Hx    Rectal cancer Neg Hx    Stomach cancer Neg Hx    Social History   Socioeconomic History   Marital status: Married    Spouse name: Not on file   Number of children: 4   Years of education: 12   Highest education level: Associate degree: occupational, Scientist, product/process development, or vocational program  Occupational History   Occupation: Disability  Tobacco Use   Smoking status: Former    Current packs/day: 0.00    Average packs/day: 2.0 packs/day for 40.0 years (80.0 ttl pk-yrs)    Types: Cigarettes    Start date: 04/16/1974    Quit date: 04/16/2014    Years since quitting: 9.6   Smokeless tobacco: Former  Building services engineer status: Never Used  Substance and Sexual Activity   Alcohol use: Not Currently    Alcohol/week: 7.0 standard drinks of alcohol    Types: 7 Standard drinks or  equivalent per week   Drug use: Yes    Types: Other-see comments, Marijuana    Comment: CBD gummy- last used in summer of 2024, [place] - 03/05/23- 1 months ago-   Sexual activity: Yes    Birth control/protection: None  Other Topics Concern   Not on file  Social History Narrative   Not on file   Social Drivers of Health   Financial Resource Strain: Low Risk  (12/18/2023)   Overall Financial Resource Strain (CARDIA)  Difficulty of Paying Living Expenses: Not hard at all  Food Insecurity: No Food Insecurity (12/18/2023)   Hunger Vital Sign    Worried About Running Out of Food in the Last Year: Never true    Ran Out of Food in the Last Year: Never true  Transportation Needs: No Transportation Needs (12/18/2023)   PRAPARE - Administrator, Civil Service (Medical): No    Lack of Transportation (Non-Medical): No  Physical Activity: Insufficiently Active (12/18/2023)   Exercise Vital Sign    Days of Exercise per Week: 2 days    Minutes of Exercise per Session: 40 min  Stress: No Stress Concern Present (12/18/2023)   [employer] of Occupational Health - Occupational Stress Questionnaire    Feeling of Stress: Not at all  Social Connections: Unknown (12/18/2023)   Social Connection and Isolation Panel    Frequency of Communication with Friends and Family: Once a week    Frequency of Social Gatherings with Friends and Family: Patient declined    Attends Religious Services: Never    Database administrator or Organizations: No    Attends Engineer, structural: Not on file    Marital Status: Married    Tobacco Counseling Counseling given: Not Answered    Clinical Intake:  Pre-visit preparation completed: Yes  Pain : 0-10 Pain Score: 2  Pain Type: Chronic pain Pain Location: Rib cage Pain Descriptors / Indicators: Aching Pain Onset: More than a month ago Pain Frequency: Intermittent     Nutritional Risks: None Diabetes: No  Lab Results  Component Value  Date   HGBA1C 6.0 (H) 06/14/2022   HGBA1C 5.7 (H) 06/02/2021   HGBA1C 5.3 05/27/2020     How often do you need to have someone help you when you read instructions, pamphlets, or other written materials from your doctor or pharmacy?: 1 - Never  Interpreter Needed?: No  Information entered by :: NAllen LPN   Activities of Daily Living     12/18/2023    9:31 AM 03/07/2023    2:00 PM  In your present state of health, do you have any difficulty performing the following activities:  Hearing? 1 1  Comment wears hearing aids sometimes   Vision? 0 0  Difficulty concentrating or making decisions? 0 0  Walking or climbing stairs? 1   Comment knee trouble   Dressing or bathing? 0   Doing errands, shopping? 0 0  Preparing Food and eating ? N   Using the Toilet? N   In the past six months, have you accidently leaked urine? N   Do you have problems with loss of bowel control? N   Managing your Medications? N   Managing your Finances? N   Housekeeping or managing your Housekeeping? N     Patient Care Team: Gayle Saddie JULIANNA DEVONNA as PCP - General (Physician Assistant) Kate Lonni CROME, MD as PCP - Cardiology (Cardiology) [place], Tawni CROME, MD as Referring Physician (Dermatology)  I have updated your Care Teams any recent Medical Services you may have received from other providers in the past year.     Assessment:   This is a routine wellness examination for [place].  Hearing/Vision screen Hearing Screening - Comments:: Has hearing aids, but does not wear often Vision Screening - Comments:: Regular eye exams, in [facility]   Goals Addressed             This Visit's Progress    Patient Stated  12/19/2023, live another year       Depression Screen     12/19/2023    1:39 PM 06/21/2022    1:29 PM 06/05/2021    8:44 AM 06/03/2020    9:17 AM 03/26/2019    8:20 AM 03/05/2019    8:35 AM 05/20/2018   10:32 AM  PHQ 2/9 Scores  PHQ - 2 Score 0 0 0 0 0 0 0  PHQ- 9  Score 0  0 0 3 0 1    Fall Risk     12/18/2023    9:31 AM 06/21/2022    1:27 PM 06/05/2021    8:44 AM 06/03/2020    9:17 AM 03/26/2019    8:19 AM  Fall Risk   Falls in the past year? 0 1 0 1 0   Number falls in past yr: 0 0 0 0   Injury with Fall? 0 0 0 0   Risk for fall due to : Medication side effect  No Fall Risks    Follow up Falls prevention discussed;Falls evaluation completed  Falls evaluation completed  Falls evaluation completed  Falls evaluation completed      Data saved with a previous flowsheet row definition    MEDICARE RISK AT HOME:  Medicare Risk at Home Any stairs in or around the home?: (Patient-Rptd) Yes If so, are there any without handrails?: (Patient-Rptd) No Home free of loose throw rugs in walkways, pet beds, electrical cords, etc?: (Patient-Rptd) Yes Adequate lighting in your home to reduce risk of falls?: (Patient-Rptd) Yes Life alert?: (Patient-Rptd) No Use of a cane, walker or w/c?: (Patient-Rptd) No Grab bars in the bathroom?: (Patient-Rptd) No Shower chair or bench in shower?: (Patient-Rptd) No Elevated toilet seat or a handicapped toilet?: (Patient-Rptd) No  TIMED UP AND GO:  Was the test performed?  No  Cognitive Function: 6CIT completed        12/19/2023    1:41 PM 06/21/2022    1:26 PM 06/05/2021    8:38 AM 06/03/2020    9:17 AM 03/26/2019    8:17 AM  6CIT Screen  What Year? 0 points 0 points 0 points 4 points 0 points  What month? 0 points 0 points 0 points 0 points 0 points  What time? 0 points 0 points 0 points 0 points 3 points  Count back from 20 0 points 0 points 0 points 0 points 2 points  Months in reverse 4 points 0 points 4 points 4 points 4 points  Repeat phrase 0 points 0 points 0 points 0 points 4 points  Total Score 4 points 0 points 4 points 8 points 13 points    Immunizations Immunization History  Administered Date(s) Administered   INFLUENZA, HIGH DOSE SEASONAL PF 01/28/2019   Influenza,inj,quad, With Preservative  01/30/2018   Influenza-Unspecified 02/24/2016, 01/20/2020, 02/16/2021, 02/20/2022   PFIZER(Purple Top)SARS-COV-2 Vaccination 05/21/2019, 06/15/2019, 02/02/2020   Pneumococcal Conjugate-13 02/18/2013, 02/19/2015   Pneumococcal Polysaccharide-23 04/02/2018   Tdap 04/16/2016   Zoster Recombinant(Shingrix) 04/02/2018, 06/25/2018   Zoster, Live 02/19/2015    Screening Tests Health Maintenance  Topic Date Due   INFLUENZA VACCINE  11/15/2023   COVID-19 Vaccine (4 - 2025-26 season) 12/16/2023   Colonoscopy  06/08/2024   Medicare Annual Wellness (AWV)  12/18/2024   DTaP/Tdap/Td (2 - Td or Tdap) 04/16/2026   Pneumococcal Vaccine: 50+ Years  Completed   Hepatitis C Screening  Completed   Zoster Vaccines- Shingrix  Completed   HPV VACCINES  Aged Out   Meningococcal  B Vaccine  Aged Out   Lung Cancer Screening  Discontinued    Health Maintenance  Health Maintenance Due  Topic Date Due   INFLUENZA VACCINE  11/15/2023   COVID-19 Vaccine (4 - 2025-26 season) 12/16/2023   Health Maintenance Items Addressed: Declines flu and covid vaccine.  Additional Screening:  Vision Screening: Recommended annual ophthalmology exams for early detection of glaucoma and other disorders of the eye. Would you like a referral to an eye doctor? No    Dental Screening: Recommended annual dental exams for proper oral hygiene  Community Resource Referral / Chronic Care Management: CRR required this visit?  No   CCM required this visit?  No   Plan:    I have personally reviewed and noted the following in the patient's chart:   Medical and social history Use of alcohol, tobacco or illicit drugs  Current medications and supplements including opioid prescriptions. Patient is not currently taking opioid prescriptions. Functional ability and status Nutritional status Physical activity Advanced directives List of other physicians Hospitalizations, surgeries, and ER visits in previous 12  months Vitals Screenings to include cognitive, depression, and falls Referrals and appointments  In addition, I have reviewed and discussed with patient certain preventive protocols, quality metrics, and best practice recommendations. A written personalized care plan for preventive services as well as general preventive health recommendations were provided to patient.   Ardella FORBES Dawn, LPN   [id number]   After Visit Summary: (MyChart) Due to this being a telephonic visit, the after visit summary with patients personalized plan was offered to patient via MyChart   Notes: Nothing significant to report at this time.

## 2023-12-19 NOTE — Patient Instructions (Signed)
 Brandon Schultz , Thank you for taking time out of your busy schedule to complete your Annual Wellness Visit with me. I enjoyed our conversation and look forward to speaking with you again next year. I, as well as your care team,  appreciate your ongoing commitment to your health goals. Please review the following plan we discussed and let me know if I can assist you in the future. Your Game plan/ To Do List    Referrals: If you haven't heard from the office you've been referred to, please reach out to them at the phone provided.   Follow up Visits: We will see or speak with you next year for your Next Medicare AWV with our clinical staff Have you seen your provider in the last 6 months (3 months if uncontrolled diabetes)? Yes  Clinician Recommendations:  Aim for 30 minutes of exercise or brisk walking, 6-8 glasses of water, and 5 servings of fruits and vegetables each day.       This is a list of the screenings recommended for you:  Health Maintenance  Topic Date Due   Flu Shot  11/15/2023   COVID-19 Vaccine (4 - 2025-26 season) 12/16/2023   Colon Cancer Screening  06/08/2024   Medicare Annual Wellness Visit  12/18/2024   DTaP/Tdap/Td vaccine (2 - Td or Tdap) 04/16/2026   Pneumococcal Vaccine for age over 39  Completed   Hepatitis C Screening  Completed   Zoster (Shingles) Vaccine  Completed   HPV Vaccine  Aged Out   Meningitis B Vaccine  Aged Out   Screening for Lung Cancer  Discontinued    Advanced directives: (Copy Requested) Please bring a copy of your health care power of attorney and living will to the office to be added to your chart at your convenience. You can mail to [facility] [address] [place], [place] [address] or email to [email or web address] Advance Care Planning is important because it:  [x]  Makes sure you receive the medical care that is consistent with your values, goals, and preferences  [x]  It provides guidance to your family and loved  ones and reduces their decisional burden about whether or not they are making the right decisions based on your wishes.  Follow the link provided in your after visit summary or read over the paperwork we have mailed to you to help you started getting your Advance Directives in place. If you need assistance in completing these, please reach out to us  so that we can help you!  See attachments for Preventive Care and Fall Prevention Tips.

## 2024-02-05 ENCOUNTER — Encounter: Payer: Self-pay | Admitting: Internal Medicine

## 2024-02-16 NOTE — Progress Notes (Unsigned)
 Cardiology Office Note:    Date:  02/19/2024   ID:  Brandon Schultz, DOB 1950-05-14, MRN [id number]  PCP:  Gayle Saddie FALCON, PA-C  Cardiologist:  Lonni LITTIE Nanas, MD  Electrophysiologist:  None   Referring MD: [name] LABOR, PA   Chief Complaint  Patient presents with   Shortness of Breath    History of Present Illness:    Brandon Schultz is a 73 y.o. male with a hx of COPD, asthma, CKD, GERD, hyperlipidemia, melanoma who presents for follow-up.  He was referred by Claudette Gartner, PA for evaluation of CAD and hyperlipidemia, initially seen 05/08/2021.  Calcium  score on 05/02/2021 was 1733 (93rd percentile).  He reported intermittent chest pain.  Exercise Myoview  04/2021 showed good exercise capacity (achieved 10.0 METS), normal perfusion, LVEF 61%.  Echocardiogram 05/2021 showed normal biventricular function, no significant valvular disease, dilated aortic root measuring 40 mm.  Since last visit, he reports he is doing okay. Denies any chest pain, lightheadedness, syncope, lower extremity edema, or palpitations.  Reports has some dyspnea which he atributes to COPD; started 2 days ago on treatment for COPD exacerbation, reports improving.  Has not been exercising.   BP Readings from Last 3 Encounters:  02/19/24 (!) 140/76  02/17/24 121/76  05/14/23 129/79     Past Medical History:  Diagnosis Date   Allergy    seasonal   Arthritis    Asthma    Cervical stenosis of spine    Colon polyps    Complication of anesthesia    COPD (chronic obstructive pulmonary disease) (HCC)    Diverticulitis    Emphysema    GERD (gastroesophageal reflux disease)    Gum disease    H/O degenerative disc disease    Hemorrhoids    HOH (hard of hearing)    Inguinal hernia    Melanoma (HCC)    facial   PONV (postoperative nausea and vomiting)    Renal disease    Bright's Disease-childhood   Syncope    Umbilical hernia    Urinary tract infection    Von Willebrand disease (HCC)      Past Surgical History:  Procedure Laterality Date   BRONCHIAL BIOPSY  09/25/2022   Procedure: BRONCHIAL BIOPSIES;  Surgeon: Shelah Lamar RAMAN, MD;  Location: [facility] ENDOSCOPY;  Service: Pulmonary;;   BRONCHIAL BRUSHINGS  09/25/2022   Procedure: BRONCHIAL BRUSHINGS;  Surgeon: Shelah Lamar RAMAN, MD;  Location: [facility] ENDOSCOPY;  Service: Pulmonary;;   BRONCHIAL NEEDLE ASPIRATION BIOPSY  09/25/2022   Procedure: BRONCHIAL NEEDLE ASPIRATION BIOPSIES;  Surgeon: Shelah Lamar RAMAN, MD;  Location: [facility] ENDOSCOPY;  Service: Pulmonary;;   cataract surgery Bilateral 11/2020   COLONOSCOPY  06/08/2021   07/27/2016   DENTAL SURGERY     FIDUCIAL MARKER PLACEMENT  09/25/2022   Procedure: FIDUCIAL MARKER PLACEMENT;  Surgeon: Shelah Lamar RAMAN, MD;  Location: [facility] ENDOSCOPY;  Service: Pulmonary;;   HERNIA REPAIR     Umbilical & Inguinal   INTERCOSTAL NERVE BLOCK Right 03/07/2023   Procedure: INTERCOSTAL NERVE BLOCK, RIGHT CHEST;  Surgeon: Kerrin Elspeth BROCKS, MD;  Location: [facility] OR;  Service: Thoracic;  Laterality: Right;   left knee surgery     LYMPH NODE DISSECTION Right 03/07/2023   Procedure: LYMPH NODE DISSECTION, RIGHT LUNG;  Surgeon: Kerrin Elspeth BROCKS, MD;  Location: [facility] OR;  Service: Thoracic;  Laterality: Right;   MELANOMA EXCISION     facial   neck and shoulder injection     POLYPECTOMY     WISDOM  TOOTH EXTRACTION      Current Medications: Current Meds  Medication Sig   doxycycline  (VIBRA -TABS) 100 MG tablet Take 1 tablet (100 mg total) by mouth 2 (two) times daily.   pantoprazole  (PROTONIX ) 40 MG tablet Take 1 tablet (40 mg total) by mouth 2 (two) times daily.   rosuvastatin  (CRESTOR ) 20 MG tablet Take 1 tablet (20 mg total) by mouth daily.   timolol  (TIMOPTIC ) 0.5 % ophthalmic solution Place 1 drop into the left eye every morning.   [DISCONTINUED] Blood Pressure Monitoring (OMRON 3 SERIES BP MONITOR) DEVI Use daily as needed to monitor BP     Allergies:   Patient has no known allergies.   Social  History   Socioeconomic History   Marital status: Married    Spouse name: Not on file   Number of children: 4   Years of education: 12   Highest education level: Associate degree: occupational, scientist, product/process development, or vocational program  Occupational History   Occupation: Disability  Tobacco Use   Smoking status: Former    Current packs/day: 0.00    Average packs/day: 2.0 packs/day for 40.0 years (80.0 ttl pk-yrs)    Types: Cigarettes    Start date: 04/16/1974    Quit date: 04/16/2014    Years since quitting: 9.8   Smokeless tobacco: Former  Building Services Engineer status: Never Used  Substance and Sexual Activity   Alcohol use: Not Currently    Alcohol/week: 7.0 standard drinks of alcohol    Types: 7 Standard drinks or equivalent per week   Drug use: Yes    Types: Other-see comments, Marijuana    Comment: CBD gummy- last used in summer of 2024, [place] - 03/05/23- 1 months ago-   Sexual activity: Yes    Birth control/protection: None  Other Topics Concern   Not on file  Social History Narrative   Not on file   Social Drivers of Health   Financial Resource Strain: Low Risk  (12/18/2023)   Overall Financial Resource Strain (CARDIA)    Difficulty of Paying Living Expenses: Not hard at all  Food Insecurity: No Food Insecurity (12/18/2023)   Hunger Vital Sign    Worried About Running Out of Food in the Last Year: Never true    Ran Out of Food in the Last Year: Never true  Transportation Needs: No Transportation Needs (12/18/2023)   PRAPARE - Administrator, Civil Service (Medical): No    Lack of Transportation (Non-Medical): No  Physical Activity: Insufficiently Active (12/18/2023)   Exercise Vital Sign    Days of Exercise per Week: 2 days    Minutes of Exercise per Session: 40 min  Stress: No Stress Concern Present (12/18/2023)   [employer] of Occupational Health - Occupational Stress Questionnaire    Feeling of Stress: Not at all  Social Connections: Unknown (12/18/2023)    Social Connection and Isolation Panel    Frequency of Communication with Friends and Family: Once a week    Frequency of Social Gatherings with Friends and Family: Patient declined    Attends Religious Services: Never    Database Administrator or Organizations: No    Attends Engineer, Structural: Not on file    Marital Status: Married     Family History: The patient's family history includes Cancer in his maternal grandfather; Colon cancer in his mother; Dementia in his maternal grandmother; Healthy in his daughter, sister, and son; Heart attack in his father; Syncope episode in his sister.  There is no history of Colon polyps, Rectal cancer, or Stomach cancer.  ROS:   Please see the history of present illness.     All other systems reviewed and are negative.  EKGs/Labs/Other Studies Reviewed:    The following studies were reviewed today:   EKG:   05/08/21: NSR, rate 68, no ST abnormalities 02/19/2024: Normal sinus rhythm, rate 74, no ST abnormalities  Recent Labs: 02/17/2024: ALT 16; BUN 19; Creatinine, Ser 0.90; Hemoglobin 15.2; Platelets 311; Potassium 4.5; Sodium 139; TSH 2.300  Recent Lipid Panel    Component Value Date/Time   CHOL 161 02/17/2024 1039   TRIG 134 02/17/2024 1039   HDL 42 02/17/2024 1039   CHOLHDL 3.8 02/17/2024 1039   CHOLHDL 5 03/29/2016 1013   VLDL 25.0 03/29/2016 1013   LDLCALC 95 02/17/2024 1039    Physical Exam:    VS:  BP (!) 140/76 (BP Location: Right Arm, Patient Position: Sitting, Cuff Size: Normal)   Pulse 74   Ht 5' 10 (1.778 m)   Wt 220 lb 12.8 oz (100.2 kg)   SpO2 96%   BMI 31.68 kg/m     Wt Readings from Last 3 Encounters:  02/19/24 220 lb 12.8 oz (100.2 kg)  02/17/24 214 lb 1.3 oz (97.1 kg)  05/14/23 216 lb (98 kg)     GEN:  Well nourished, well developed in no acute distress HEENT: Normal NECK: No JVD; No carotid bruits LYMPHATICS: No lymphadenopathy CARDIAC: RRR, no murmurs, rubs, gallops RESPIRATORY:  Clear to  auscultation without rales, wheezing or rhonchi  ABDOMEN: Soft, non-tender, non-distended MUSCULOSKELETAL:  No edema; No deformity  SKIN: Warm and dry NEUROLOGIC:  Alert and oriented x 3 PSYCHIATRIC:  Normal affect   ASSESSMENT:    1. Coronary artery disease involving native coronary artery of native heart without angina pectoris   2. Hyperlipidemia, unspecified hyperlipidemia type   3. Elevated BP reading w/ no diagnosis of HTN   4. Pain in both lower extremities     PLAN:    CAD: Calcium  score on 05/02/2021 was 1733 (93rd percentile).  He reported atypical chest pain, but also having dyspnea on exertion.  Exercise Myoview  04/2021 showed good exercise capacity (achieved 10.0 METS), normal perfusion, LVEF 61%.  Echocardiogram 05/2021 showed normal biventricular function, no significant valvular disease, dilated aortic root measuring 40 mm. -Continue rosuvastatin .   -Has not been on aspirin due to history of GI bleeding.  Has EGD/colonoscopy on 04/2021, has Barrett's esophagus  Hyperlipidemia: On rosuvastatin  20 mg, supposed to be taking daily but reports only taking every other day.  LDL 95 on 02/17/2024.  Recommend taking rosuvastatin  20 mg every day and recheck fasting lipid panel in 3 months  Elevated BP: No history of hypertension but BP elevated in clinic today.  Asked to check BP twice daily for next week and let us  know results  Leg pain: Check ABIs  Non-small cell lung cancer: Treated with chemoimmunotherapy and right upper lobectomy 02/2023.  Currently under observation.  Follows with oncology   RTC in 1 year    Medication Adjustments/Labs and Tests Ordered: Current medicines are reviewed at length with the patient today.  Concerns regarding medicines are outlined above.  Orders Placed This Encounter  Procedures   Lipid panel   EKG 12-Lead   VAS US  ABI WITH/WO TBI   VAS US  LOWER EXTREMITY ARTERIAL DUPLEX   Meds ordered this encounter  Medications   DISCONTD: Blood  Pressure Monitoring (OMRON 3 SERIES BP MONITOR) DEVI  Sig: Use daily as needed to monitor BP    Dispense:  1 each    Refill:  0   Blood Pressure Monitoring (OMRON 3 SERIES BP MONITOR) DEVI    Sig: Use daily as needed to monitor BP    Dispense:  1 each    Refill:  0    Patient Instructions  Medication Instructions:  Your physician recommends that you continue on your current medications as directed. Please refer to the Current Medication list given to you today.  ** Please make sure you are taking your rosuvastatin  daily.  *If you need a refill on your cardiac medications before your next appointment, please call your pharmacy*  Lab Work: Fasting Lipid Panel in 3 months  If you have labs (blood work) drawn today and your tests are completely normal, you will receive your results only by: MyChart Message (if you have MyChart) OR A paper copy in the mail If you have any lab test that is abnormal or we need to change your treatment, we will call you to review the results.  Testing/Procedures: Your physician has requested that you have an ankle brachial index (ABI). During this test an ultrasound and blood pressure cuff are used to evaluate the arteries that supply the arms and legs with blood. Allow thirty minutes for this exam. There are no restrictions or special instructions.  Your physician has requested that you have a lower or upper extremity arterial duplex. This test is an ultrasound of the arteries in the legs or arms. It looks at arterial blood flow in the legs and arms. Allow one hour for Lower and Upper Arterial scans. There are no restrictions or special instructions.  Please note: We ask at that you not bring children with you during ultrasound (echo/ vascular) testing. Due to room size and safety concerns, children are not allowed in the ultrasound rooms during exams. Our front office staff cannot provide observation of children in our lobby area while testing is being  conducted. An adult accompanying a patient to their appointment will only be allowed in the ultrasound room at the discretion of the ultrasound technician under special circumstances. We apologize for any inconvenience.   Please note: We ask at that you not bring children with you during ultrasound (echo/ vascular) testing. Due to room size and safety concerns, children are not allowed in the ultrasound rooms during exams. Our front office staff cannot provide observation of children in our lobby area while testing is being conducted. An adult accompanying a patient to their appointment will only be allowed in the ultrasound room at the discretion of the ultrasound technician under special circumstances. We apologize for any inconvenience.   Follow-Up: At [facility], you and your health needs are our priority.  As part of our continuing mission to provide you with exceptional heart care, our providers are all part of one team.  This team includes your primary Cardiologist (physician) and Advanced Practice Providers or APPs (Physician Assistants and Nurse Practitioners) who all work together to provide you with the care you need, when you need it.  Your next appointment:   12 months  We recommend signing up for the patient portal called MyChart.  Sign up information is provided on this After Visit Summary.  MyChart is used to connect with patients for Virtual Visits (Telemedicine).  Patients are able to view lab/test results, encounter notes, upcoming appointments, etc.  Non-urgent messages can be sent to your provider as well.  To learn more about what you can do with MyChart, go to [email or web address].              Signed, Lonni LITTIE Nanas, MD  02/19/2024 11:30 AM    [facility]

## 2024-02-17 ENCOUNTER — Ambulatory Visit

## 2024-02-17 VITALS — BP 121/76 | HR 68 | Temp 97.6°F | Ht 71.0 in | Wt 214.1 lb

## 2024-02-17 DIAGNOSIS — T451X5A Adverse effect of antineoplastic and immunosuppressive drugs, initial encounter: Secondary | ICD-10-CM

## 2024-02-17 DIAGNOSIS — E782 Mixed hyperlipidemia: Secondary | ICD-10-CM

## 2024-02-17 DIAGNOSIS — K2970 Gastritis, unspecified, without bleeding: Secondary | ICD-10-CM | POA: Diagnosis not present

## 2024-02-17 DIAGNOSIS — J339 Nasal polyp, unspecified: Secondary | ICD-10-CM | POA: Insufficient documentation

## 2024-02-17 DIAGNOSIS — R1013 Epigastric pain: Secondary | ICD-10-CM | POA: Diagnosis not present

## 2024-02-17 DIAGNOSIS — G620 Drug-induced polyneuropathy: Secondary | ICD-10-CM | POA: Insufficient documentation

## 2024-02-17 DIAGNOSIS — R202 Paresthesia of skin: Secondary | ICD-10-CM | POA: Diagnosis not present

## 2024-02-17 DIAGNOSIS — K219 Gastro-esophageal reflux disease without esophagitis: Secondary | ICD-10-CM | POA: Diagnosis not present

## 2024-02-17 DIAGNOSIS — J440 Chronic obstructive pulmonary disease with acute lower respiratory infection: Secondary | ICD-10-CM | POA: Diagnosis not present

## 2024-02-17 DIAGNOSIS — J209 Acute bronchitis, unspecified: Secondary | ICD-10-CM | POA: Insufficient documentation

## 2024-02-17 DIAGNOSIS — C3491 Malignant neoplasm of unspecified part of right bronchus or lung: Secondary | ICD-10-CM

## 2024-02-17 DIAGNOSIS — R739 Hyperglycemia, unspecified: Secondary | ICD-10-CM | POA: Diagnosis not present

## 2024-02-17 DIAGNOSIS — J449 Chronic obstructive pulmonary disease, unspecified: Secondary | ICD-10-CM | POA: Diagnosis not present

## 2024-02-17 MED ORDER — PANTOPRAZOLE SODIUM 40 MG PO TBEC: 40.0000 mg | DELAYED_RELEASE_TABLET | Freq: Two times a day (BID) | ORAL | 2 refills | Status: AC

## 2024-02-17 MED ORDER — ROSUVASTATIN CALCIUM 20 MG PO TABS: 20.0000 mg | ORAL_TABLET | Freq: Every day | ORAL | 0 refills | Status: AC

## 2024-02-17 MED ORDER — DOXYCYCLINE HYCLATE 100 MG PO TABS: 100.0000 mg | ORAL_TABLET | Freq: Two times a day (BID) | ORAL | 0 refills | Status: DC

## 2024-02-17 NOTE — Assessment & Plan Note (Signed)
-   Significant nasal drainage in the mornings, described as 'pouring' - Nasal polyp identified during PET scan - Recommend Flonase nasal spray for management of nasal polyps

## 2024-02-17 NOTE — Patient Instructions (Signed)
 VISIT SUMMARY: During your visit, we addressed your persistent cough, reviewed your medications, and discussed your ongoing health issues including COPD, lung cancer, nasal polyps, peripheral neuropathy, GERD, and hyperlipidemia.  YOUR PLAN: ACUTE BRONCHITIS: Your persistent cough is likely due to bronchitis. -We have prescribed doxycycline  100 mg to be taken twice daily for 10 days with food. -Please monitor your symptoms and report if there is no improvement.  CHRONIC OBSTRUCTIVE PULMONARY DISEASE (COPD) WITH EMPHYSEMA: Your COPD and emphysema are contributing to your respiratory symptoms. -Continue using your  inhalers as needed.  STATUS POST RIGHT LUNG CANCER RESECTION: You are continuing follow-up care after your right lung cancer resection. -Continue with your scheduled scans and follow-up appointments with your pulmonologist.  NASAL POLYP: You have a nasal polyp causing significant drainage. -Continue using Flonase nasal spray once daily.  CHEMOTHERAPY-INDUCED PERIPHERAL NEUROPATHY: You have tingling in your feet due to chemotherapy, and gabapentin  was not tolerated. -Currently, no medication is prescribed for this condition. I will check your b12 levels and make sure that this is not contributing.  GASTROESOPHAGEAL REFLUX DISEASE (GERD): Your GERD is managed with pantoprazole . -We have refilled your pantoprazole  with a 90-day supply.  MIXED HYPERLIPIDEMIA: Your mixed hyperlipidemia is managed with rosuvastatin . -We have ordered a cholesterol panel. -We have refilled your rosuvastatin  prescription.  If you have any problems before your next visit feel free to message me via MyChart (minor issues or questions) or call the office, otherwise you may reach out to schedule an office visit.  Thank you! Saddie Sacks, PA-C

## 2024-02-17 NOTE — Assessment & Plan Note (Signed)
 Stable and well controlled on protonix  40 mg BID

## 2024-02-17 NOTE — Assessment & Plan Note (Signed)
 Last lipid panel: LDL 82, HDL 44, Trig 149. The 10-year ASCVD risk score (Arnett DK, et al., 2019) is: 17.7% Continue Rosuvastatin  20 mg daily. Rechecking lipid and CMP with labs. Will adjust medication as indicated.

## 2024-02-17 NOTE — Assessment & Plan Note (Signed)
 Follows with specialists including thoracic surgery, oncology, and pulmonology.

## 2024-02-17 NOTE — Assessment & Plan Note (Signed)
 COPD - predominantly emphysema. Continue regular follow up with Pulmonology. He sees Dr. Rosalio with [facility] [place] so I [place] see notes. He reports that he is not currently on any inhalers daily.  Will treat his cough today as likely acute bronchitis in the setting of COPD.

## 2024-02-17 NOTE — Assessment & Plan Note (Signed)
 Persistent cough likely due to bronchitis.  - Prescribe doxycycline  100 mg BID for 10 days with food. - Advise monitoring symptoms and report if no improvement. - Continue follow up with pulmonology

## 2024-02-17 NOTE — Progress Notes (Signed)
 Established Patient Office Visit  Subjective   Patient ID: Brandon Schultz, male    DOB: 11/22/1950  Age: 73 y.o. MRN: [id number]  Chief Complaint  Patient presents with   New Patient (Initial Visit)    Transfer of Care    HPI  Discussed the use of AI scribe software for clinical note transcription with the patient, who gave verbal consent to proceed.  History of Present Illness   Brandon Schultz is a 73 year old male with lung cancer and COPD who presents with a persistent cough and medication review.  Cough and respiratory symptoms - Persistent cough, worsened at night and aggravated by lying down to sleep - Cough produces a 'whistling' sound with breathing - No nocturnal awakenings due to cough, but difficulty falling asleep - No significant shortness of breath - History of frequent bronchitis, typically requiring antibiotics for resolution - Rare use of ipratropium inhaler for COPD - History of lung cancer with partial right lung resection in December - History of emphysema and COPD - Lifelong smoker but quit after his diagnosis of lung cancer  - Follows with pulmonologist Dr. Norberta Vettichira with [facility]   Peripheral neuropathy - Tingling sensation in feet, more pronounced than in hands - Onset after chemotherapy for lung cancer - Unable to tolerate gabapentin  due to vivid dreams - Currently not on medication for neuropathy  Nasal drainage and nasal polyp - Significant nasal drainage in the mornings, described as 'pouring' - Nasal polyp identified during PET scan - Uses Flonase nasal spray for management of nasal polyps  Medication review - Rosuvastatin  taken daily for hyperlipidemia - Pantoprazole  for gastric acid, requests 90-day supply [place]  Recent infections and allergies - History of bronchitis, previously treated with azithromycin  post-surgery - Allergic to doxycycline         ROS Per  HPI.    Objective:     BP 121/76   Pulse 68   Temp 97.6 F (36.4 C) (Oral)   Ht 5' 11 (1.803 m)   Wt 214 lb 1.3 oz (97.1 kg)   SpO2 97%   BMI 29.86 kg/m    Physical Exam Constitutional:      General: He is not in acute distress.    Appearance: Normal appearance.  Cardiovascular:     Rate and Rhythm: Normal rate and regular rhythm.     Heart sounds: Normal heart sounds. No murmur heard.    No friction rub. No gallop.  Pulmonary:     Effort: Pulmonary effort is normal. No respiratory distress.     Breath sounds: Normal breath sounds.  Abdominal:     General: Bowel sounds are normal.  Musculoskeletal:        General: No swelling.     Cervical back: Neck supple.  Lymphadenopathy:     Cervical: No cervical adenopathy.  Skin:    General: Skin is warm and dry.  Neurological:     General: No focal deficit present.     Mental Status: He is alert.  Psychiatric:        Mood and Affect: Mood normal.        Behavior: Behavior normal.        Thought Content: Thought content normal.      No results found for any visits on 02/17/24.  Last CBC Lab Results  Component Value Date   WBC 9.4 09/30/2023   HGB 14.9 09/30/2023   HCT 44.0  09/30/2023   MCV 87.1 09/30/2023   MCH 29.5 09/30/2023   RDW 13.2 09/30/2023   PLT 287 09/30/2023   Last metabolic panel Lab Results  Component Value Date   GLUCOSE 112 (H) 09/30/2023   NA 140 09/30/2023   K 3.7 09/30/2023   CL 104 09/30/2023   CO2 26 09/30/2023   BUN 18 09/30/2023   CREATININE 0.95 09/30/2023   GFRNONAA >60 09/30/2023   CALCIUM  9.7 09/30/2023   PROT 8.2 (H) 09/30/2023   ALBUMIN  4.9 09/30/2023   LABGLOB 2.3 06/14/2022   AGRATIO 1.9 06/14/2022   BILITOT 0.8 09/30/2023   ALKPHOS 62 09/30/2023   AST 17 09/30/2023   ALT 21 09/30/2023   ANIONGAP 10 09/30/2023   Last lipids Lab Results  Component Value Date   CHOL 152 06/14/2022   HDL 44 06/14/2022   LDLCALC 82 06/14/2022   TRIG 149 06/14/2022   CHOLHDL 3.5  06/14/2022   Last hemoglobin A1c Lab Results  Component Value Date   HGBA1C 6.0 (H) 06/14/2022   Last thyroid  functions Lab Results  Component Value Date   TSH 0.695 06/14/2022   T3TOTAL 136 02/20/2021   FREET4 1.33 02/20/2021   Last vitamin D No results found for: 25OHVITD2, 25OHVITD3, VD25OH    The 10-year ASCVD risk score (Arnett DK, et al., 2019) is: 17.7%    Assessment & Plan:   Elevated blood sugar -     Hemoglobin A1c; Future -     B12 and Folate Panel; Future  Mixed hyperlipidemia Assessment & Plan: Last lipid panel: LDL 82, HDL 44, Trig 149. The 10-year ASCVD risk score (Arnett DK, et al., 2019) is: 17.7% Continue Rosuvastatin  20 mg daily. Rechecking lipid and CMP with labs. Will adjust medication as indicated.  Orders: -     Rosuvastatin  Calcium ; Take 1 tablet (20 mg total) by mouth daily.  Dispense: 90 tablet; Refill: 0 -     VITAMIN D 25 Hydroxy (Vit-D Deficiency, Fractures); Future -     Lipid panel; Future -     Comprehensive metabolic panel with GFR; Future  Gastroesophageal reflux disease without esophagitis Assessment & Plan: Stable and well controlled on protonix  40 mg BID  Orders: -     Pantoprazole  Sodium; Take 1 tablet (40 mg total) by mouth 2 (two) times daily.  Dispense: 180 tablet; Refill: 2  Abdominal pain, epigastric -     Pantoprazole  Sodium; Take 1 tablet (40 mg total) by mouth 2 (two) times daily.  Dispense: 180 tablet; Refill: 2  Gastritis, presence of bleeding unspecified, unspecified chronicity, unspecified gastritis type -     Pantoprazole  Sodium; Take 1 tablet (40 mg total) by mouth 2 (two) times daily.  Dispense: 180 tablet; Refill: 2  Tingling of both feet -     VITAMIN D 25 Hydroxy (Vit-D Deficiency, Fractures); Future -     TSH; Future -     Comprehensive metabolic panel with GFR; Future -     CBC with Differential/Platelet; Future -     B12 and Folate Panel; Future  Acute bronchitis with COPD (HCC) Assessment &  Plan: Persistent cough likely due to bronchitis.  - Prescribe doxycycline  100 mg BID for 10 days with food. - Advise monitoring symptoms and report if no improvement. - Continue follow up with pulmonology   Malignant neoplasm of right lung, unspecified part of lung [facility]) Assessment & Plan: Follows with specialists including thoracic surgery, oncology, and pulmonology.    Chronic obstructive pulmonary disease, unspecified COPD type (  HCC) Assessment & Plan: COPD - predominantly emphysema. Continue regular follow up with Pulmonology. He sees Dr. Rosalio with [facility] [place], so I cannot see notes. He reports that he is not currently on any inhalers daily.  Will treat his cough today as likely acute bronchitis in the setting of COPD.   Nasal polyp Assessment & Plan: - Significant nasal drainage in the mornings, described as 'pouring' - Nasal polyp identified during PET scan - Recommend Flonase nasal spray for management of nasal polyps   Peripheral neuropathy due to chemotherapy Assessment & Plan: Peripheral neuropathy in feet post-chemotherapy. Gabapentin  not tolerated. Patient ok with managing without medication for now.  Consider trial of pregabalin in the future if worsening.    Other orders -     Doxycycline  Hyclate; Take 1 tablet (100 mg total) by mouth 2 (two) times daily.  Dispense: 20 tablet; Refill: 0   Return in about 1 year (around 02/16/2025) for HLD, Thalia, Med check.    Saddie JULIANNA Sacks, PA-C

## 2024-02-17 NOTE — Assessment & Plan Note (Signed)
 Peripheral neuropathy in feet post-chemotherapy. Gabapentin  not tolerated. Patient ok with managing without medication for now.  Consider trial of pregabalin in the future if worsening.

## 2024-02-18 ENCOUNTER — Ambulatory Visit: Payer: Self-pay

## 2024-02-18 LAB — COMPREHENSIVE METABOLIC PANEL WITH GFR
ALT: 16 IU/L (ref 0–44)
AST: 15 IU/L (ref 0–40)
Albumin: 4.5 g/dL (ref 3.8–4.8)
Alkaline Phosphatase: 70 IU/L (ref 47–123)
BUN/Creatinine Ratio: 21 (ref 10–24)
BUN: 19 mg/dL (ref 8–27)
Bilirubin Total: 0.6 mg/dL (ref 0.0–1.2)
CO2: 21 mmol/L (ref 20–29)
Calcium: 10 mg/dL (ref 8.6–10.2)
Chloride: 103 mmol/L (ref 96–106)
Creatinine, Ser: 0.9 mg/dL (ref 0.76–1.27)
Globulin, Total: 2.4 g/dL (ref 1.5–4.5)
Glucose: 98 mg/dL (ref 70–99)
Potassium: 4.5 mmol/L (ref 3.5–5.2)
Sodium: 139 mmol/L (ref 134–144)
Total Protein: 6.9 g/dL (ref 6.0–8.5)
eGFR: 91 mL/min/1.73 (ref >59)

## 2024-02-18 LAB — CBC WITH DIFFERENTIAL/PLATELET
Basophils Absolute: 0 x10E3/uL (ref 0.0–0.2)
Basos: 1 %
EOS (ABSOLUTE): 0.5 x10E3/uL — ABNORMAL HIGH (ref 0.0–0.4)
Eos: 6 %
Hematocrit: 45.3 % (ref 37.5–51.0)
Hemoglobin: 15.2 g/dL (ref 13.0–17.7)
Immature Grans (Abs): 0 x10E3/uL (ref 0.0–0.1)
Immature Granulocytes: 0 %
Lymphocytes Absolute: 2.2 x10E3/uL (ref 0.7–3.1)
Lymphs: 26 %
MCH: 30.4 pg (ref 26.6–33.0)
MCHC: 33.6 g/dL (ref 31.5–35.7)
MCV: 91 fL (ref 79–97)
Monocytes Absolute: 0.7 x10E3/uL (ref 0.1–0.9)
Monocytes: 8 %
Neutrophils Absolute: 5.1 x10E3/uL (ref 1.4–7.0)
Neutrophils: 59 %
Platelets: 311 x10E3/uL (ref 150–450)
RBC: 5 x10E6/uL (ref 4.14–5.80)
RDW: 13.4 % (ref 11.6–15.4)
WBC: 8.6 x10E3/uL (ref 3.4–10.8)

## 2024-02-18 LAB — B12 AND FOLATE PANEL
Folate: 6 ng/mL (ref >3.0)
Vitamin B-12: 327 pg/mL (ref 232–1245)

## 2024-02-18 LAB — LIPID PANEL
Chol/HDL Ratio: 3.8 ratio (ref 0.0–5.0)
Cholesterol, Total: 161 mg/dL (ref 100–199)
HDL: 42 mg/dL (ref >39)
LDL Chol Calc (NIH): 95 mg/dL (ref 0–99)
Triglycerides: 134 mg/dL (ref 0–149)
VLDL Cholesterol Cal: 24 mg/dL (ref 5–40)

## 2024-02-18 LAB — TSH: TSH: 2.3 u[IU]/mL (ref 0.450–4.500)

## 2024-02-18 LAB — HEMOGLOBIN A1C
Est. average glucose Bld gHb Est-mCnc: 120 mg/dL
Hgb A1c MFr Bld: 5.8 % — ABNORMAL HIGH (ref 4.8–5.6)

## 2024-02-18 LAB — VITAMIN D 25 HYDROXY (VIT D DEFICIENCY, FRACTURES): Vit D, 25-Hydroxy: 31.9 ng/mL (ref 30.0–100.0)

## 2024-02-19 ENCOUNTER — Encounter: Payer: Self-pay | Admitting: Cardiology

## 2024-02-19 ENCOUNTER — Ambulatory Visit: Attending: Cardiology | Admitting: Cardiology

## 2024-02-19 VITALS — BP 140/76 | HR 74 | Ht 70.0 in | Wt 220.8 lb

## 2024-02-19 DIAGNOSIS — M79604 Pain in right leg: Secondary | ICD-10-CM

## 2024-02-19 DIAGNOSIS — I2510 Atherosclerotic heart disease of native coronary artery without angina pectoris: Secondary | ICD-10-CM | POA: Diagnosis not present

## 2024-02-19 DIAGNOSIS — R030 Elevated blood-pressure reading, without diagnosis of hypertension: Secondary | ICD-10-CM

## 2024-02-19 DIAGNOSIS — M79605 Pain in left leg: Secondary | ICD-10-CM

## 2024-02-19 DIAGNOSIS — E785 Hyperlipidemia, unspecified: Secondary | ICD-10-CM | POA: Diagnosis not present

## 2024-02-19 MED ORDER — OMRON 3 SERIES BP MONITOR DEVI: 0 refills | Status: DC

## 2024-02-19 NOTE — Patient Instructions (Addendum)
 Medication Instructions:  Your physician recommends that you continue on your current medications as directed. Please refer to the Current Medication list given to you today.  ** Please make sure you are taking your rosuvastatin  daily.  *If you need a refill on your cardiac medications before your next appointment, please call your pharmacy*  Lab Work: Fasting Lipid Panel in 3 months  If you have labs (blood work) drawn today and your tests are completely normal, you will receive your results only by: MyChart Message (if you have MyChart) OR A paper copy in the mail If you have any lab test that is abnormal or we need to change your treatment, we will call you to review the results.  Testing/Procedures: Your physician has requested that you have an ankle brachial index (ABI). During this test an ultrasound and blood pressure cuff are used to evaluate the arteries that supply the arms and legs with blood. Allow thirty minutes for this exam. There are no restrictions or special instructions.  Your physician has requested that you have a lower or upper extremity arterial duplex. This test is an ultrasound of the arteries in the legs or arms. It looks at arterial blood flow in the legs and arms. Allow one hour for Lower and Upper Arterial scans. There are no restrictions or special instructions.  Please note: We ask at that you not bring children with you during ultrasound (echo/ vascular) testing. Due to room size and safety concerns, children are not allowed in the ultrasound rooms during exams. Our front office staff cannot provide observation of children in our lobby area while testing is being conducted. An adult accompanying a patient to their appointment will only be allowed in the ultrasound room at the discretion of the ultrasound technician under special circumstances. We apologize for any inconvenience.   Please note: We ask at that you not bring children with you during ultrasound  (echo/ vascular) testing. Due to room size and safety concerns, children are not allowed in the ultrasound rooms during exams. Our front office staff cannot provide observation of children in our lobby area while testing is being conducted. An adult accompanying a patient to their appointment will only be allowed in the ultrasound room at the discretion of the ultrasound technician under special circumstances. We apologize for any inconvenience.   Follow-Up: At [facility], you and your health needs are our priority.  As part of our continuing mission to provide you with exceptional heart care, our providers are all part of one team.  This team includes your primary Cardiologist (physician) and Advanced Practice Providers or APPs (Physician Assistants and Nurse Practitioners) who all work together to provide you with the care you need, when you need it.  Your next appointment:   12 months  We recommend signing up for the patient portal called MyChart.  Sign up information is provided on this After Visit Summary.  MyChart is used to connect with patients for Virtual Visits (Telemedicine).  Patients are able to view lab/test results, encounter notes, upcoming appointments, etc.  Non-urgent messages can be sent to your provider as well.   To learn more about what you can do with MyChart, go to [email or web address].

## 2024-02-19 NOTE — Progress Notes (Signed)
 Scheduling request sent for appointment for scan review 1 week after CT scan scheduled in December.

## 2024-02-27 ENCOUNTER — Encounter: Payer: Self-pay | Admitting: Cardiology

## 2024-02-27 ENCOUNTER — Encounter: Payer: Self-pay | Admitting: Internal Medicine

## 2024-02-27 ENCOUNTER — Telehealth: Payer: Self-pay

## 2024-02-27 ENCOUNTER — Telehealth (HOSPITAL_BASED_OUTPATIENT_CLINIC_OR_DEPARTMENT_OTHER): Payer: Self-pay

## 2024-02-27 DIAGNOSIS — M1712 Unilateral primary osteoarthritis, left knee: Secondary | ICD-10-CM | POA: Diagnosis not present

## 2024-02-27 NOTE — Telephone Encounter (Signed)
 We received surgical clearance on 02/27/24. Placed in providers box.

## 2024-02-27 NOTE — Telephone Encounter (Signed)
   Pre-operative Risk Assessment    Patient Name: Brandon Schultz  DOB: 1950/05/01 MRN: [id number]   Date of last office visit: 02/19/24 with Dr. Kate Date of next office visit: NA   Request for Surgical Clearance    Procedure:  Left total knee arthroplasty  Date of Surgery:  Clearance TBD                                 Surgeon:  Dr. Edna Surgeon's Group or Practice Name:  [facility] Phone number:  [phone] Fax number:  [phone]   Type of Clearance Requested:   - Medical   Type of Anesthesia:  Spinal   Additional requests/questions:    SignedAugustin JONETTA Daring   02/27/2024, 3:03 PM

## 2024-02-28 NOTE — Telephone Encounter (Signed)
 Hi Dr. Kate.You recently saw this patient in clinic on 02/19/2024 at which time it sounds like he was doing okay. He reported some dyspnea but he attributed this to his COPD. Are you able to comment on surgical clearance for upcoming left total knee arthroplasty (date TBD)? Please route your response to P CV DIV PREOP. Thank you!  ~Mianna Iezzi

## 2024-02-29 ENCOUNTER — Encounter: Payer: Self-pay | Admitting: Cardiology

## 2024-03-02 ENCOUNTER — Encounter: Payer: Self-pay | Admitting: Internal Medicine

## 2024-03-02 NOTE — Telephone Encounter (Signed)
     Primary Cardiologist: Lonni LITTIE Nanas, MD  Chart reviewed as part of pre-operative protocol coverage. Given past medical history and time since last visit, based on ACC/AHA guidelines, Jimmey B Eakle would be at acceptable risk for the planned procedure without further cardiovascular testing.   His RCRI is low risk, 0.9% risk of major cardiac event.  He is able to complete greater than 4 METS of physical activity.  I will route this recommendation to the requesting party via Epic fax function and remove from pre-op pool.  Please call with questions.  Josefa HERO. Valentino Saavedra NP-C     03/02/2024, 11:15 AM [facility] [address] [place], [place] [address] Office [phone]

## 2024-03-02 NOTE — Telephone Encounter (Signed)
No further cardiac workup recommended prior to surgery

## 2024-03-03 ENCOUNTER — Other Ambulatory Visit: Payer: Self-pay | Admitting: Internal Medicine

## 2024-03-03 ENCOUNTER — Telehealth: Payer: Self-pay

## 2024-03-03 MED ORDER — DULOXETINE HCL 30 MG PO CPEP: ORAL_CAPSULE | ORAL | 3 refills | Status: DC

## 2024-03-03 NOTE — Progress Notes (Signed)
 For the persistent neuropathy, I sent a prescription of duloxetine 30 mg p.o. daily for 1 week followed by 60 mg daily as tolerated to his pharmacy.

## 2024-03-03 NOTE — Telephone Encounter (Signed)
 Faxed clearance form to [facility] with confirmation at [phone].

## 2024-03-04 ENCOUNTER — Other Ambulatory Visit: Payer: Self-pay | Admitting: *Deleted

## 2024-03-04 MED ORDER — DULOXETINE HCL 30 MG PO CPEP: ORAL_CAPSULE | ORAL | 3 refills | Status: DC

## 2024-03-04 NOTE — Telephone Encounter (Signed)
 Copied from CRM [id number]. Topic: General - Other >> Mar 04, 2024 12:58 PM Amy B wrote: Reason for CRM: Patient's wife states patient is waiting for medical clearance prior to surgery. They are unable to schedule surgery until clearance is received.  Please fax to [facility], [phone].  Attention [employer]

## 2024-03-10 ENCOUNTER — Other Ambulatory Visit: Payer: Self-pay

## 2024-03-17 ENCOUNTER — Inpatient Hospital Stay: Attending: Physician Assistant

## 2024-03-17 ENCOUNTER — Ambulatory Visit (HOSPITAL_COMMUNITY)
Admission: RE | Admit: 2024-03-17 | Discharge: 2024-03-17 | Disposition: A | Source: Ambulatory Visit | Attending: Internal Medicine | Admitting: Internal Medicine

## 2024-03-17 DIAGNOSIS — C3490 Malignant neoplasm of unspecified part of unspecified bronchus or lung: Secondary | ICD-10-CM | POA: Insufficient documentation

## 2024-03-17 DIAGNOSIS — I700 Atherosclerosis of aorta: Secondary | ICD-10-CM | POA: Diagnosis not present

## 2024-03-17 DIAGNOSIS — J90 Pleural effusion, not elsewhere classified: Secondary | ICD-10-CM | POA: Diagnosis not present

## 2024-03-17 DIAGNOSIS — J439 Emphysema, unspecified: Secondary | ICD-10-CM | POA: Diagnosis not present

## 2024-03-17 LAB — CMP (CANCER CENTER ONLY)
ALT: 13 U/L (ref 0–44)
AST: 16 U/L (ref 15–41)
Albumin: 4.4 g/dL (ref 3.5–5.0)
Alkaline Phosphatase: 66 U/L (ref 38–126)
Anion gap: 10 (ref 5–15)
BUN: 14 mg/dL (ref 8–23)
CO2: 25 mmol/L (ref 22–32)
Calcium: 9.3 mg/dL (ref 8.9–10.3)
Chloride: 106 mmol/L (ref 98–111)
Creatinine: 0.91 mg/dL (ref 0.61–1.24)
GFR, Estimated: >60 mL/min (ref >60)
Glucose, Bld: 110 mg/dL — ABNORMAL HIGH (ref 70–99)
Potassium: 4 mmol/L (ref 3.5–5.1)
Sodium: 140 mmol/L (ref 135–145)
Total Bilirubin: 0.5 mg/dL (ref 0.0–1.2)
Total Protein: 7.2 g/dL (ref 6.5–8.1)

## 2024-03-17 LAB — CBC WITH DIFFERENTIAL (CANCER CENTER ONLY)
Abs Immature Granulocytes: 0.02 K/uL (ref 0.00–0.07)
Basophils Absolute: 0.1 K/uL (ref 0.0–0.1)
Basophils Relative: 1 %
Eosinophils Absolute: 0.4 K/uL (ref 0.0–0.5)
Eosinophils Relative: 5 %
HCT: 42.3 % (ref 39.0–52.0)
Hemoglobin: 14.4 g/dL (ref 13.0–17.0)
Immature Granulocytes: 0 %
Lymphocytes Relative: 26 %
Lymphs Abs: 2.2 K/uL (ref 0.7–4.0)
MCH: 30 pg (ref 26.0–34.0)
MCHC: 34 g/dL (ref 30.0–36.0)
MCV: 88.1 fL (ref 80.0–100.0)
Monocytes Absolute: 0.7 K/uL (ref 0.1–1.0)
Monocytes Relative: 8 %
Neutro Abs: 5.1 K/uL (ref 1.7–7.7)
Neutrophils Relative %: 60 %
Platelet Count: 252 K/uL (ref 150–400)
RBC: 4.8 MIL/uL (ref 4.22–5.81)
RDW: 13.4 % (ref 11.5–15.5)
WBC Count: 8.5 K/uL (ref 4.0–10.5)
nRBC: 0 % (ref 0.0–0.2)

## 2024-03-17 MED ORDER — IOHEXOL 300 MG/ML  SOLN
75.0000 mL | Freq: Once | INTRAMUSCULAR | Status: AC | PRN
  Administered 2024-03-17: 75 mL via INTRAVENOUS

## 2024-03-18 ENCOUNTER — Ambulatory Visit (HOSPITAL_COMMUNITY): Admission: RE | Admit: 2024-03-18 | Discharge: 2024-03-18 | Attending: Cardiology

## 2024-03-18 DIAGNOSIS — M79605 Pain in left leg: Secondary | ICD-10-CM

## 2024-03-18 DIAGNOSIS — M79604 Pain in right leg: Secondary | ICD-10-CM

## 2024-03-19 ENCOUNTER — Ambulatory Visit: Payer: Self-pay | Admitting: Cardiology

## 2024-03-19 LAB — VAS US ABI WITH/WO TBI
Left ABI: 0.93
Right ABI: 1.05

## 2024-03-20 NOTE — Progress Notes (Unsigned)
 [facility] OFFICE PROGRESS NOTE  Gayle Saddie FALCON, PA-C [address] [place] [address]  DIAGNOSIS: Stage IIb (T1c, N1, M0) non-small cell lung cancer, poorly differentiated with sarcomatoid features diagnosed in June 2024.   PRIOR THERAPY: 1) Neoadjuvant chemoimmunotherapy with carboplatin  for AUC of 6, paclitaxel  200 Mg/M2 and nivolumab  360 mg IV every 3 weeks with Neulasta  support.  First dose 11/21/2022.  Status post 3 cycles.  2) status post robotic assisted right upper lobectomy with lymph node dissection under the care of Dr. Kerrin on March 07, 2023.  The final pathology showed no residual disease after the neoadjuvant chemoimmunotherapy.    CURRENT THERAPY: Observation  INTERVAL HISTORY: Brandon Schultz 74 y.o. male returns to the clinic today for a 32-month follow-up visit accompanied by his wife.  The patient was last seen in the clinic in June 2024.  In summary the patient has a history of stage IIb non-small cell lung cancer.  He completed neoadjuvant treatment followed by surgical resection.  He is on observation and feeling fine.  He is scheduled for a knee replacement in January.   He denies fevers, chills, night sweats, or changes in appetite.  He experiences chronic peripheral neuropathy since chemotherapy, characterized by tingling in his hands, feet, and eyes. He previously trialed gabapentin , which caused vivid dreams, and duloxetine , which he discontinued due to concerns about glaucoma.   He currently manages symptoms without prescription medications and is awaiting supportive footwear. He has not tried over-the-counter supplements.   He has chronic cough producing clear sputum, frequent phlegm, and occasional wheezing. His shortness of breath is good. Symptoms are managed with an albuterol  rescue inhaler as needed. He has had no recent pulmonary infections and sometimes takes mucinex . He states he has trouble with his prostate and is  getting his PSA checked next week. He was given flomax .   The patient recently had a restaging CT scan.  He is here today for evaluation and to review his scan results.   MEDICAL HISTORY: Past Medical History:  Diagnosis Date   Allergy    seasonal   Arthritis    Asthma    Cervical stenosis of spine    Colon polyps    Complication of anesthesia    COPD (chronic obstructive pulmonary disease) (HCC)    Diverticulitis    Emphysema    GERD (gastroesophageal reflux disease)    Gum disease    H/O degenerative disc disease    Hemorrhoids    HOH (hard of hearing)    Inguinal hernia    Melanoma (HCC)    facial   PONV (postoperative nausea and vomiting)    Renal disease    Bright's Disease-childhood   Syncope    Umbilical hernia    Urinary tract infection    Von Willebrand disease (HCC)     ALLERGIES:  has no known allergies.  MEDICATIONS:  Current Outpatient Medications  Medication Sig Dispense Refill   pantoprazole  (PROTONIX ) 40 MG tablet Take 1 tablet (40 mg total) by mouth 2 (two) times daily. 180 tablet 2   rosuvastatin  (CRESTOR ) 20 MG tablet Take 1 tablet (20 mg total) by mouth daily. 90 tablet 0   tamsulosin  (FLOMAX ) 0.4 MG CAPS capsule Take 1 capsule (0.4 mg total) by mouth daily. 30 capsule 3   timolol  (TIMOPTIC ) 0.5 % ophthalmic solution Place 1 drop into the left eye every morning.     No current facility-administered medications for this visit.  SURGICAL HISTORY:  Past Surgical History:  Procedure Laterality Date   BRONCHIAL BIOPSY  09/25/2022   Procedure: BRONCHIAL BIOPSIES;  Surgeon: Shelah Lamar RAMAN, MD;  Location: [facility] ENDOSCOPY;  Service: Pulmonary;;   BRONCHIAL BRUSHINGS  09/25/2022   Procedure: BRONCHIAL BRUSHINGS;  Surgeon: Shelah Lamar RAMAN, MD;  Location: [facility] ENDOSCOPY;  Service: Pulmonary;;   BRONCHIAL NEEDLE ASPIRATION BIOPSY  09/25/2022   Procedure: BRONCHIAL NEEDLE ASPIRATION BIOPSIES;  Surgeon: Shelah Lamar RAMAN, MD;  Location: [facility] ENDOSCOPY;  Service:  Pulmonary;;   cataract surgery Bilateral 11/2020   COLONOSCOPY  06/08/2021   07/27/2016   DENTAL SURGERY     FIDUCIAL MARKER PLACEMENT  09/25/2022   Procedure: FIDUCIAL MARKER PLACEMENT;  Surgeon: Shelah Lamar RAMAN, MD;  Location: [facility] ENDOSCOPY;  Service: Pulmonary;;   HERNIA REPAIR     Umbilical & Inguinal   INTERCOSTAL NERVE BLOCK Right 03/07/2023   Procedure: INTERCOSTAL NERVE BLOCK, RIGHT CHEST;  Surgeon: Kerrin Elspeth BROCKS, MD;  Location: [facility] OR;  Service: Thoracic;  Laterality: Right;   left knee surgery     LYMPH NODE DISSECTION Right 03/07/2023   Procedure: LYMPH NODE DISSECTION, RIGHT LUNG;  Surgeon: Kerrin Elspeth BROCKS, MD;  Location: [facility] OR;  Service: Thoracic;  Laterality: Right;   MELANOMA EXCISION     facial   neck and shoulder injection     POLYPECTOMY     WISDOM TOOTH EXTRACTION      REVIEW OF SYSTEMS:   Review of Systems  Constitutional: Negative for appetite change, chills, fatigue, fever and unexpected weight change.  HENT:   Negative for mouth sores, nosebleeds, sore throat and trouble swallowing.   Eyes: Negative for eye problems and icterus.  Respiratory: Positive for occasional cough. Negative for hemoptysis, shortness of breath and wheezing.   Cardiovascular: Negative for chest pain and leg swelling.  Gastrointestinal: Negative for abdominal pain, constipation, diarrhea, nausea and vomiting.  Genitourinary: Negative for bladder incontinence, difficulty urinating, dysuria, frequency and hematuria.   Musculoskeletal: Positive for peripheral neuropathy. Negative for back pain, gait problem, neck pain and neck stiffness.  Skin: Negative for itching and rash.  Neurological: Negative for dizziness, extremity weakness, gait problem, headaches, light-headedness and seizures.  Hematological: Negative for adenopathy. Does not bruise/bleed easily.  Psychiatric/Behavioral: Negative for confusion, depression and sleep disturbance. The patient is not nervous/anxious.      PHYSICAL EXAMINATION:  Blood pressure 128/72, pulse 83, temperature 97.7 F (36.5 C), temperature source Temporal, resp. rate 16, height 5' 10 (1.778 m), weight 221 lb (100.2 kg), SpO2 98%.  ECOG PERFORMANCE STATUS: 1  Physical Exam  Constitutional: Oriented to person, place, and time and well-developed, well-nourished, and in no distress.   HENT:  Head: Normocephalic and atraumatic.  Mouth/Throat: Oropharynx is clear and moist. No oropharyngeal exudate.  Eyes: Conjunctivae are normal. Right eye exhibits no discharge. Left eye exhibits no discharge. No scleral icterus.  Neck: Normal range of motion. Neck supple.  Cardiovascular: Normal rate, regular rhythm, normal heart sounds and intact distal pulses.   Pulmonary/Chest: Effort normal and breath sounds normal. No respiratory distress. No wheezes. No rales.  Abdominal: Soft. Bowel sounds are normal. Exhibits no distension and no mass. There is no tenderness.  Musculoskeletal: Normal range of motion. Exhibits no edema.  Lymphadenopathy:    No cervical adenopathy.  Neurological: Alert and oriented to person, place, and time. Exhibits normal muscle tone. Gait normal. Coordination normal.  Skin: Skin is warm and dry. No rash noted. Not diaphoretic. No erythema. No pallor.  Psychiatric: Mood,  memory and judgment normal.  Vitals reviewed.  LABORATORY DATA: Lab Results  Component Value Date   WBC 8.5 03/17/2024   HGB 14.4 03/17/2024   HCT 42.3 03/17/2024   MCV 88.1 03/17/2024   PLT 252 03/17/2024      Chemistry      Component Value Date/Time   NA 140 03/17/2024 1057   NA 139 02/17/2024 1039   K 4.0 03/17/2024 1057   CL 106 03/17/2024 1057   CO2 25 03/17/2024 1057   BUN 14 03/17/2024 1057   BUN 19 02/17/2024 1039   CREATININE 0.91 03/17/2024 1057   CREATININE 0.92 02/18/2013 1431      Component Value Date/Time   CALCIUM  9.3 03/17/2024 1057   ALKPHOS 66 03/17/2024 1057   AST 16 03/17/2024 1057   ALT 13 03/17/2024 1057    BILITOT 0.5 03/17/2024 1057       RADIOGRAPHIC STUDIES:  CT Chest W Contrast Result Date: 03/21/2024 CLINICAL DATA:  Restaging non-small cell lung cancer with history of right upper lobe lobectomy. * Tracking Code: BO * EXAM: CT CHEST WITH CONTRAST TECHNIQUE: Multidetector CT imaging of the chest was performed during intravenous contrast administration. RADIATION DOSE REDUCTION: This exam was performed according to the departmental dose-optimization program which includes automated exposure control, adjustment of the mA and/or kV according to patient size and/or use of iterative reconstruction technique. CONTRAST:  75mL OMNIPAQUE  IOHEXOL  300 MG/ML  SOLN COMPARISON:  Multiple prior imaging studies. Most recent chest CT 09/30/1998. FINDINGS: Cardiovascular: The heart is normal in size. No pericardial effusion. Stable tortuosity and calcification of the thoracic aorta. Stable extensive three-vessel coronary artery calcifications. Stable enlarged right pulmonary artery which appears to have a chronic fenestration or remote partial dissection. Mediastinum/Nodes: No mediastinal or hilar mass or lymphadenopathy. Small scattered lymph nodes are stable. Lungs/Pleura: Stable advanced underlying emphysematous changes and associated pulmonary scarring. Basilar predominant interstitial lung disease again noted. Surgical changes from a right upper lobe lobectomy. No findings suspicious for recurrent tumor. No infiltrates, edema or effusions. There are several small scattered sub 4 mm pulmonary nodules which are stable. Scattered calcified granulomas are also noted. No new or worrisome pulmonary lesions/nodules. Curvilinear densities in the right lower lobe appear to be areas of mucoid impaction. Persistent areas of lower lobe bronchiectasis. Stable small right pleural effusion. Upper Abdomen: Stable left adrenal gland adenomas. No findings for hepatic or adrenal gland metastasis. Stable simple upper pole right renal  cyst. Stable vascular disease. Musculoskeletal: No significant bony findings. IMPRESSION: 1. Surgical changes from a right upper lobe lobectomy. No findings suspicious for recurrent tumor, mediastinal/hilar adenopathy or metastatic disease. 2. Stable advanced emphysematous changes and basilar predominant interstitial lung disease. 3. Stable small scattered sub 4 mm pulmonary nodules. No new or worrisome pulmonary lesions/nodules. 4. Stable small right pleural effusion. 5. Stable left adrenal gland adenomas. 6. Stable enlarged right pulmonary artery which appears to have a chronic fenestration or remote partial dissection. 7. Stable extensive three-vessel coronary artery calcifications. Aortic Atherosclerosis (ICD10-I70.0) and Emphysema (ICD10-J43.9). Electronically Signed   By: MYRTIS Stammer M.D.   On: 03/21/2024 19:44   VAS US  LOWER EXTREMITY ARTERIAL DUPLEX Result Date: 03/19/2024 LOWER EXTREMITY ARTERIAL DUPLEX STUDY Patient Name:  JOSHUAN BOLANDER  Date of Exam:   03/18/2024 Medical Rec #: [id number]          Accession #:    [id number] Date of Birth: 10-21-1950          Patient Gender: M Patient Age:   19  years Exam Location:  [address] Procedure:      VAS US  LOWER EXTREMITY ARTERIAL DUPLEX Referring Phys: LONNI NANAS --------------------------------------------------------------------------------  Indications: Claudication. High Risk Factors: None.  Current ABI: 1.05/0.93 Comparison Study: None. Performing Technologist: Garnette Rockers  Examination Guidelines: A complete evaluation includes B-mode imaging, spectral Doppler, color Doppler, and power Doppler as needed of all accessible portions of each vessel. Bilateral testing is considered an integral part of a complete examination. Limited examinations for reoccurring indications may be performed as noted.  +-----------+--------+-----+--------+---------+--------+ RIGHT      PSV cm/sRatioStenosisWaveform Comments  +-----------+--------+-----+--------+---------+--------+ CFA Prox   123                  triphasic         +-----------+--------+-----+--------+---------+--------+ CFA Distal 88                   biphasic          +-----------+--------+-----+--------+---------+--------+ DFA        48                   triphasic         +-----------+--------+-----+--------+---------+--------+ SFA Prox   88                   triphasic         +-----------+--------+-----+--------+---------+--------+ SFA Mid    94                   triphasic         +-----------+--------+-----+--------+---------+--------+ SFA Distal 62                   triphasic         +-----------+--------+-----+--------+---------+--------+ POP Prox   43                   triphasic         +-----------+--------+-----+--------+---------+--------+ POP Distal 49                   triphasic         +-----------+--------+-----+--------+---------+--------+ ATA Distal 84                   triphasic         +-----------+--------+-----+--------+---------+--------+ PTA Distal 73                   triphasic         +-----------+--------+-----+--------+---------+--------+ PERO Distal39                   biphasic          +-----------+--------+-----+--------+---------+--------+  +-----------+--------+-----+--------+---------+--------+ LEFT       PSV cm/sRatioStenosisWaveform Comments +-----------+--------+-----+--------+---------+--------+ CFA Prox   76                   triphasic         +-----------+--------+-----+--------+---------+--------+ CFA Distal 76                   biphasic          +-----------+--------+-----+--------+---------+--------+ DFA        34                   triphasic         +-----------+--------+-----+--------+---------+--------+ SFA Prox   73                   triphasic         +-----------+--------+-----+--------+---------+--------+  SFA Mid     52                   triphasic         +-----------+--------+-----+--------+---------+--------+ SFA Distal 27                   triphasic         +-----------+--------+-----+--------+---------+--------+ POP Prox   35                   biphasic          +-----------+--------+-----+--------+---------+--------+ POP Distal 39                   triphasic         +-----------+--------+-----+--------+---------+--------+ ATA Distal 21                   biphasic          +-----------+--------+-----+--------+---------+--------+ PTA Distal 55                   biphasic          +-----------+--------+-----+--------+---------+--------+ PERO Distal26                   biphasic          +-----------+--------+-----+--------+---------+--------+  Summary: Right: Patent flow seen throughout LE arteries without evidence of arterieal disease. Left: Patent flow seen throughout LE arteries without evidence of arterieal disease.  See table(s) above for measurements and observations. Electronically signed by Deatrice Cage MD on 03/19/2024 at 2:06:01 PM.    Final    VAS US  ABI WITH/WO TBI Result Date: 03/19/2024  LOWER EXTREMITY DOPPLER STUDY Patient Name:  TULIO FACUNDO  Date of Exam:   03/18/2024 Medical Rec #: [id number]          Accession #:    [id number] Date of Birth: 06/24/1950          Patient Gender: M Patient Age:   47 years Exam Location:  [address] Procedure:      VAS US  ABI WITH/WO TBI Referring Phys: LONNI NANAS --------------------------------------------------------------------------------  Indications: Claudication. High Risk Factors: None.  Comparison Study: None. Performing Technologist: Garnette Rockers  Examination Guidelines: A complete evaluation includes at minimum, Doppler waveform signals and systolic blood pressure reading at the level of bilateral brachial, anterior tibial, and posterior tibial arteries, when vessel segments are accessible. Bilateral  testing is considered an integral part of a complete examination. Photoelectric Plethysmograph (PPG) waveforms and toe systolic pressure readings are included as required and additional duplex testing as needed. Limited examinations for reoccurring indications may be performed as noted.  ABI Findings: +---------+------------------+-----+--------+--------+ Right    Rt Pressure (mmHg)IndexWaveformComment  +---------+------------------+-----+--------+--------+ Brachial 136                                     +---------+------------------+-----+--------+--------+ PTA      143               1.05 biphasic         +---------+------------------+-----+--------+--------+ DP       133               0.98 biphasic         +---------+------------------+-----+--------+--------+ Great Toe127               0.93 Normal           +---------+------------------+-----+--------+--------+ +---------+------------------+-----+--------+-------+  Left     Lt Pressure (mmHg)IndexWaveformComment +---------+------------------+-----+--------+-------+ Brachial 122                                    +---------+------------------+-----+--------+-------+ PTA      126               0.93 biphasic        +---------+------------------+-----+--------+-------+ DP       114               0.84 biphasic        +---------+------------------+-----+--------+-------+ Great Toe91                0.67 Normal          +---------+------------------+-----+--------+-------+ +-------+-----------+-----------+------------+------------+ ABI/TBIToday's ABIToday's TBIPrevious ABIPrevious TBI +-------+-----------+-----------+------------+------------+ Right  1.05       0.93                                +-------+-----------+-----------+------------+------------+ Left   0.93       0.67                                +-------+-----------+-----------+------------+------------+  Summary: Right: Resting  right ankle-brachial index is within normal range. The right toe-brachial index is normal.  Left: Resting left ankle-brachial index indicates mild left lower extremity arterial disease. The left toe-brachial index is abnormal. Left toe pressure is >60 mmHg which suggests adequate perfusion for healing. *See table(s) above for measurements and observations.  Electronically signed by Deatrice Cage MD on 03/19/2024 at 2:03:56 PM.    Final      ASSESSMENT/PLAN:  This is a very pleasant 73 year old Caucasian male diagnosed with stage IIb (T1c, N1, M0) non-small cell lung cancer, poorly differentiated with sarcomatoid features.  He presented with a right upper lobe lung nodule in addition to right suprahilar nodal metastasis.  He was diagnosed in June 2024.  He underwent neoadjuvant treatment with carboplatin , paclitaxel , and nivolumab  IV every 3 weeks with Neulasta  support for 3 cycles.  He then underwent right upper lobectomy with lymph node dissection under the care of Dr. Kerrin on 03/07/2023.  The patient is currently on observation and feeling well.  The patient was seen with Dr. Sherrod today.  Dr. Sherrod personally and independently reviewed the scan and discussed results with the patient today.  The scan showed no evidence of disease progression.  Dr. Sherrod recommends he continue on observation with a restaging CT of the chest in 6 months.   I will arrange for a repeat CT scan in 6 months.  We will see the patient back 1 week later to review the results in the office.  Emphysema (COPD) Advanced emphysema managed with albuterol  inhaler. No acute infection or exacerbation. - Emphasized avoidance of tobacco exposure. - Encouraged hydration for mucus clearance. - Reviewed use of albuterol  inhaler for wheezing and bronchoconstriction. - Discussed follow up with pulmonary for COPD management.   Benign prostatic hyperplasia with lower urinary tract symptoms BPH with recurrent symptoms,  currently trialing tamsulosin . Recent urinalysis negative for infection or hematuria. - Continue tamsulosin  for lower urinary tract symptoms. - PSA blood draw scheduled with his PCP. - Monitor symptoms and consider urology referral if no improvement.  Chemotherapy-induced peripheral neuropathy Chronic peripheral neuropathy secondary to Taxol . Managed conservatively after discontinuing gabapentin   and duloxetine . - Discussed palmitoylethanolamide (PEA) as potential adjunct. - Recommended men's multivitamin with B12. - Encouraged use of supportive footwear with arch support. - Reviewed avoidance of alcohol and monitoring for diabetes.  The patient was advised to call immediately if she has any concerning symptoms in the interval. The patient voices understanding of current disease status and treatment options and is in agreement with the current care plan. All questions were answered. The patient knows to call the clinic with any problems, questions or concerns. We can certainly see the patient much sooner if necessary   Orders Placed This Encounter  Procedures   CT Chest W Contrast    Standing Status:   Future    Expected Date:   09/22/2024    Expiration Date:   03/26/2025    If indicated for the ordered procedure, I authorize the administration of contrast media per Radiology protocol:   Yes    Does the patient have a contrast media/X-ray dye allergy?:   No    Preferred imaging location?:   [facility]   CBC with Differential (Cancer Center Only)    Standing Status:   Future    Expected Date:   09/24/2024    Expiration Date:   03/26/2025   CMP (Cancer Center only)    Standing Status:   Future    Expected Date:   09/24/2024    Expiration Date:   03/26/2025     I spent {CHL ONC TIME VISIT - DTPQU:[id number]} counseling the patient face to face. The total time spent in the appointment was {CHL ONC TIME VISIT - DTPQU:[id number]}.  Brandon Appelt L Dena Esperanza, PA-C 03/26/2024

## 2024-03-24 ENCOUNTER — Ambulatory Visit: Admitting: Family Medicine

## 2024-03-24 ENCOUNTER — Encounter: Payer: Self-pay | Admitting: Family Medicine

## 2024-03-24 ENCOUNTER — Ambulatory Visit: Payer: Self-pay

## 2024-03-24 VITALS — BP 110/68 | HR 87 | Ht 70.0 in | Wt 217.4 lb

## 2024-03-24 DIAGNOSIS — Z125 Encounter for screening for malignant neoplasm of prostate: Secondary | ICD-10-CM | POA: Diagnosis not present

## 2024-03-24 DIAGNOSIS — R300 Dysuria: Secondary | ICD-10-CM | POA: Diagnosis not present

## 2024-03-24 LAB — POCT URINALYSIS DIP (CLINITEK)
Bilirubin, UA: NEGATIVE
Glucose, UA: NEGATIVE mg/dL
Ketones, POC UA: NEGATIVE mg/dL
Leukocytes, UA: NEGATIVE
Nitrite, UA: NEGATIVE
POC PROTEIN,UA: NEGATIVE
Spec Grav, UA: 1.02 (ref 1.010–1.025)
Urobilinogen, UA: 1 U/dL
pH, UA: 5.5 (ref 5.0–8.0)

## 2024-03-24 MED ORDER — TAMSULOSIN HCL 0.4 MG PO CAPS: 0.4000 mg | ORAL_CAPSULE | Freq: Every day | ORAL | 3 refills | Status: AC

## 2024-03-24 NOTE — Progress Notes (Unsigned)
 Established Patient Office Visit  Subjective   Patient ID: Brandon Schultz, male    DOB: 29-May-1950  Age: 73 y.o. MRN: [id number]  Chief Complaint  Patient presents with   Dysuria    HPI  Subjective - Constant burning sensation in penis for approximately one month. Started after a course of doxycycline . Describes it as irritating. No discharge. - History of bright-colored, foamy urine. - Weak urinary stream. Denies feeling of incomplete bladder emptying. - Reports recent severe back pain, unsure if muscular or related to prostate. - No history of kidney stones. 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 - Reports erectile dysfunction. Inquired about Viagra. - History of cough with significant mucus production, improved with doxycycline  after a Z-Pak was ineffective.  Medications Current medications include Crestor , Protonix , and an eye drop. Previously took Cymbalta  (duloxetine ) for neuropathy but stopped due to nightmares. Took Flomax  approximately three years ago. Recently completed a course of doxycycline  for a cough, which was preceded by an ineffective Z-Pak.  PMH, PSH, FH, Social Hx PMHx: History of cancer, now in remission with yearly CAT scans. COPD, emphysema. Neuropathy in feet. History of BPH. History of UTI. History of Bright's disease in childhood. PSH: None mentioned. FH: None mentioned. Social Hx: Married, sexually active with wife. 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 Lives in [place] but spends time at a house in [place], [place], from April through the winter. Enjoys fishing.  ROS GU: Positive for dysuria (burning sensation), weak stream, history of foamy urine, and microscopic hematuria. Negative for urinary frequency, feeling of incomplete bladder emptying, and penile discharge. MSK: Reports back issues and a recent episode of severe back pain. Resp: History of COPD, emphysema, and cough with mucus. Neuro: History of  neuropathy. Psych: History of nightmares with duloxetine .     The 10-year ASCVD risk score (Arnett DK, et al., 2019) is: 15.8%  Health Maintenance Due  Topic Date Due   COVID-19 Vaccine (4 - 2025-26 season) 12/16/2023   Colonoscopy  06/08/2024      Objective:     BP 110/68   Pulse 87   Ht 5' 10 (1.778 m)   Wt 217 lb 6.4 oz (98.6 kg)   SpO2 97%   BMI 31.19 kg/m  {Vitals History (Optional):23777}  Physical Exam Gen: alert, oriented Pulm: no respiratory distress GI: nbs. Nontender.  Gu: no CVA tenderness Psych: pleasant affect   No results found for any visits on 03/24/24.      Assessment & Plan:   Dysuria Assessment & Plan: Presents with a one-month history of constant burning sensation in the penis and weak urinary stream. Symptoms started after a course of doxycycline . Denies incomplete emptying. Urinalysis notable for trace blood. History of BPH, previously treated with Flomax . The plan is to address symptoms which could be related to either BPH or a possible passing kidney stone. - Start Flomax  once daily. - Obtain PSA level to screen for prostate cancer given symptoms. Last PSA was 0.5 three years ago. Will refer to urology if a significant jump is noted. - Follow up with Brandon Schultz in one month to assess symptom response.  Orders: -     POCT URINALYSIS DIP (CLINITEK) -     Urine Microscopic  Other orders -     Tamsulosin  HCl; Take 1 capsule (0.4 mg total) by mouth daily.  Dispense: 30 capsule; Refill: 3     Return in about 5 weeks (around 04/28/2024) for urine sx f/u.  Brandon MARLA Slain, MD

## 2024-03-24 NOTE — Patient Instructions (Signed)
 It was nice to see you today,  We addressed the following topics today: - I am prescribing Flomax  for you to take once a day. - You need to schedule a lab visit in the morning to have your blood drawn for a PSA test. - Please schedule a follow-up appointment with Saddie in about one month to discuss if your urinary symptoms are improving. - If you develop a fever, nausea, or vomiting, go to the emergency department immediately.  Have a great day,  Rolan Slain, MD

## 2024-03-24 NOTE — Assessment & Plan Note (Signed)
 Presents with a one-month history of constant burning sensation in the penis and weak urinary stream. Symptoms started after a course of doxycycline . Denies incomplete emptying. Urinalysis notable for trace blood. History of BPH, previously treated with Flomax . The plan is to address symptoms which could be related to either BPH or a possible passing kidney stone. - Start Flomax  once daily. - Obtain PSA level to screen for prostate cancer given symptoms. Last PSA was 0.5 three years ago. Will refer to urology if a significant jump is noted. - Follow up with Brandon Schultz in one month to assess symptom response.

## 2024-03-24 NOTE — Telephone Encounter (Signed)
 FYI Only or Action Required?: FYI only for provider: appointment scheduled on 03/24/24.  Patient was last seen in primary care on 02/17/2024 by Gayle Saddie FALCON, PA-C.  Called Nurse Triage reporting Dysuria.  Symptoms began about a month ago.  Interventions attempted: OTC medications: Cranberry juice.  Symptoms are: unchanged.  Triage Disposition: See Physician Within 24 Hours  Patient/caregiver understands and will follow disposition?: Yes Reason for Disposition  All other males with painful urination  Answer Assessment - Initial Assessment Questions Patient's wife Ellouise calling in today. Hx of enlarged prostate. Denies hematuria or flank pain  1. SEVERITY: How bad is the pain?  (e.g., Scale 1-10; mild, moderate, or severe)     6/10  2. PATTERN: Is pain present every time you urinate or just sometimes?      Every time with urination and even without urination  3. ONSET: When did the painful urination start?      A month  4. FEVER: Do you have a fever? If Yes, ask: What is your temperature, how was it measured, and when did it start?     Denies  5. PAST UTI: Have you had a urine infection before? If Yes, ask: When was the last time? and What happened that time?      Wife does not think so  6. CAUSE: What do you think is causing the painful urination?      Thought it was a UTI, tried drinking cranberry juice but that did not help  7. OTHER SYMPTOMS: Do you have any other symptoms? (e.g., flank pain, penis discharge, scrotal pain, blood in urine)     Foamy urine, denies blood  Protocols used: Urination Pain - Male-A-AH  Copied from CRM #[id number]. Topic: Clinical - Red Word Triage >> Mar 24, 2024 11:51 AM [name] wrote: Red Word that prompted transfer to Nurse Triage: Ellouise, the patient's wife, is calling because the patient is experiencing burning during urination. Patient urine is very foamy

## 2024-03-25 ENCOUNTER — Ambulatory Visit: Payer: Self-pay | Admitting: Family Medicine

## 2024-03-25 LAB — URINALYSIS, MICROSCOPIC ONLY
Bacteria, UA: NONE SEEN
Casts: NONE SEEN /LPF
Epithelial Cells (non renal): NONE SEEN /HPF (ref 0–10)
RBC, Urine: NONE SEEN /HPF (ref 0–2)
WBC, UA: NONE SEEN /HPF (ref 0–5)

## 2024-03-26 ENCOUNTER — Inpatient Hospital Stay: Admitting: Physician Assistant

## 2024-03-26 VITALS — BP 128/72 | HR 83 | Temp 97.7°F | Resp 16 | Ht 70.0 in | Wt 221.0 lb

## 2024-03-26 DIAGNOSIS — C3490 Malignant neoplasm of unspecified part of unspecified bronchus or lung: Secondary | ICD-10-CM | POA: Diagnosis not present

## 2024-03-26 DIAGNOSIS — C3491 Malignant neoplasm of unspecified part of right bronchus or lung: Secondary | ICD-10-CM

## 2024-03-31 ENCOUNTER — Other Ambulatory Visit

## 2024-03-31 DIAGNOSIS — Z125 Encounter for screening for malignant neoplasm of prostate: Secondary | ICD-10-CM

## 2024-03-31 DIAGNOSIS — R300 Dysuria: Secondary | ICD-10-CM

## 2024-03-31 NOTE — Progress Notes (Signed)
 Sent message, via epic in basket, requesting orders in epic from [facility].

## 2024-04-01 LAB — PSA: Prostate Specific Ag, Serum: 0.5 ng/mL (ref 0.0–4.0)

## 2024-04-03 NOTE — Progress Notes (Signed)
 Second request for pre op orders: Left voicemail for [employer].

## 2024-04-03 NOTE — Care Plan (Signed)
 Ortho Bundle Case Management Note  Patient Details  Name: Brandon Schultz MRN: [id number] Date of Birth: 03-11-1951  met with patient and wife in the office for H&P. will discharge to home with family to assist. rolling walker ordered for home. OPPT set up with [facility] discharge instructions discussed and questions answered. Patient and MD in agreement with plan.choice offered.                     DME Arranged:  Vannie rolling DME Agency:  [facility]  HH Arranged:    HH Agency:     Additional Comments: Please contact me with any questions of if this plan should need to change.  Charlies Pitch,  RN,BSN,MHA,CCM  [facility]  [phone] 04/03/2024, 10:57 AM

## 2024-04-06 ENCOUNTER — Ambulatory Visit: Payer: Self-pay | Admitting: Emergency Medicine

## 2024-04-06 DIAGNOSIS — G8929 Other chronic pain: Secondary | ICD-10-CM

## 2024-04-06 NOTE — H&P (View-Only) (Signed)
 TOTAL KNEE ADMISSION H&P  Patient is being admitted for left total knee arthroplasty.  Subjective:  Chief Complaint:left knee pain.  HPI: Brandon Schultz, 73 y.o. male, has a history of pain and functional disability in the left knee due to arthritis and has failed non-surgical conservative treatments for greater than 12 weeks to includeNSAID's and/or analgesics, supervised PT with diminished ADL's post treatment, use of assistive devices, and activity modification.  Onset of symptoms was gradual, starting many years ago with gradually worsening course since that time. The patient noted prior procedures on the knee to include  ACL reconstruction x3 on the left knee(s).  Patient currently rates pain in the left knee(s) at 8 out of 10 with activity. Patient has night pain, worsening of pain with activity and weight bearing, pain that interferes with activities of daily living, and pain with passive range of motion.  Patient has evidence of periarticular osteophytes and joint space narrowing by imaging studies.  There is no active infection.  Patient Active Problem List   Diagnosis Date Noted   Dysuria 03/24/2024   Acute bronchitis with COPD (HCC) 02/17/2024   Nasal polyp 02/17/2024   Peripheral neuropathy due to chemotherapy 02/17/2024   Right upper lobe pulmonary nodule 03/07/2023   Encounter for antineoplastic immunotherapy 01/02/2023   Encounter for antineoplastic chemotherapy 01/02/2023   Lung cancer (HCC) 10/08/2022   COPD (chronic obstructive pulmonary disease) (HCC) 07/06/2022   Nocturia 03/05/2019   Urinary frequency 03/05/2019   Acute bilateral low back pain without sciatica 05/20/2018   History of BPH 03/27/2018   Colon polyps    Persistent cough 05/16/2016   Elevated blood sugar 05/16/2016   Tinnitus, bilateral 05/09/2016   Sensorineural hearing loss (SNHL), bilateral 03/29/2016   Medicare annual wellness visit, subsequent 08/18/2014   Hemorrhoids 07/08/2012   Mixed  hyperlipidemia 07/28/2008   DEPRESSION 07/28/2008   GERD 07/28/2008   Osteoarthritis 07/28/2008   Past Medical History:  Diagnosis Date   Allergy    seasonal   Arthritis    Asthma    Cervical stenosis of spine    Colon polyps    Complication of anesthesia    COPD (chronic obstructive pulmonary disease) (HCC)    Diverticulitis    Emphysema    GERD (gastroesophageal reflux disease)    Gum disease    H/O degenerative disc disease    Hemorrhoids    HOH (hard of hearing)    Inguinal hernia    Melanoma (HCC)    facial   PONV (postoperative nausea and vomiting)    Renal disease    Bright's Disease-childhood   Syncope    Umbilical hernia    Urinary tract infection    Von Willebrand disease (HCC)     Past Surgical History:  Procedure Laterality Date   BRONCHIAL BIOPSY  09/25/2022   Procedure: BRONCHIAL BIOPSIES;  Surgeon: Shelah Lamar RAMAN, MD;  Location: [facility] ENDOSCOPY;  Service: Pulmonary;;   BRONCHIAL BRUSHINGS  09/25/2022   Procedure: BRONCHIAL BRUSHINGS;  Surgeon: Shelah Lamar RAMAN, MD;  Location: [facility] ENDOSCOPY;  Service: Pulmonary;;   BRONCHIAL NEEDLE ASPIRATION BIOPSY  09/25/2022   Procedure: BRONCHIAL NEEDLE ASPIRATION BIOPSIES;  Surgeon: Shelah Lamar RAMAN, MD;  Location: [facility] ENDOSCOPY;  Service: Pulmonary;;   cataract surgery Bilateral 11/2020   COLONOSCOPY  06/08/2021   07/27/2016   DENTAL SURGERY     FIDUCIAL MARKER PLACEMENT  09/25/2022   Procedure: FIDUCIAL MARKER PLACEMENT;  Surgeon: Shelah Lamar RAMAN, MD;  Location: [facility] ENDOSCOPY;  Service: Pulmonary;;  HERNIA REPAIR     Umbilical & Inguinal   INTERCOSTAL NERVE BLOCK Right 03/07/2023   Procedure: INTERCOSTAL NERVE BLOCK, RIGHT CHEST;  Surgeon: Kerrin Elspeth BROCKS, MD;  Location: [facility] OR;  Service: Thoracic;  Laterality: Right;   left knee surgery     LYMPH NODE DISSECTION Right 03/07/2023   Procedure: LYMPH NODE DISSECTION, RIGHT LUNG;  Surgeon: Kerrin Elspeth BROCKS, MD;  Location: [facility] OR;  Service: Thoracic;  Laterality:  Right;   MELANOMA EXCISION     facial   neck and shoulder injection     POLYPECTOMY     WISDOM TOOTH EXTRACTION      Current Outpatient Medications  Medication Sig Dispense Refill Last Dose/Taking   cyanocobalamin (VITAMIN B12) 500 MCG tablet Take 500 mcg by mouth daily.      pantoprazole  (PROTONIX ) 40 MG tablet Take 1 tablet (40 mg total) by mouth 2 (two) times daily. 180 tablet 2    rosuvastatin  (CRESTOR ) 20 MG tablet Take 1 tablet (20 mg total) by mouth daily. 90 tablet 0    tamsulosin  (FLOMAX ) 0.4 MG CAPS capsule Take 1 capsule (0.4 mg total) by mouth daily. 30 capsule 3    timolol  (TIMOPTIC ) 0.5 % ophthalmic solution Place 1 drop into the left eye every morning.      No current facility-administered medications for this visit.   Allergies[1]  Social History   Tobacco Use   Smoking status: Former    Current packs/day: 0.00    Average packs/day: 2.0 packs/day for 40.0 years (80.0 ttl pk-yrs)    Types: Cigarettes    Start date: 04/16/1974    Quit date: 04/16/2014    Years since quitting: 9.9   Smokeless tobacco: Former  Substance Use Topics   Alcohol use: Not Currently    Alcohol/week: 7.0 standard drinks of alcohol    Types: 7 Standard drinks or equivalent per week    Family History  Problem Relation Age of Onset   Colon cancer Mother    Heart attack Father        Deceased-Early 71s   Syncope episode Sister    Healthy Sister        x1   Dementia Maternal Grandmother    Cancer Maternal Grandfather    Healthy Daughter        x2   Healthy Son        x2   Colon polyps Neg Hx    Rectal cancer Neg Hx    Stomach cancer Neg Hx      Review of Systems  Musculoskeletal:  Positive for arthralgias.  All other systems reviewed and are negative.   Objective:  Physical Exam Constitutional:      General: He is not in acute distress.    Appearance: Normal appearance. He is normal weight.  HENT:     Head: Normocephalic and atraumatic.  Eyes:     Extraocular Movements:  Extraocular movements intact.     Conjunctiva/sclera: Conjunctivae normal.     Pupils: Pupils are equal, round, and reactive to light.  Cardiovascular:     Rate and Rhythm: Normal rate and regular rhythm.     Pulses: Normal pulses.     Heart sounds: Normal heart sounds.  Pulmonary:     Effort: Pulmonary effort is normal. No respiratory distress.  Abdominal:     General: There is no distension.     Palpations: Abdomen is soft.  Musculoskeletal:        General: Tenderness present.  Cervical back: Normal range of motion and neck supple.     Comments: TTP over medial and lateral joint line, medial worse than lateral.  No calf tenderness, swelling, or erythema.  No overlying lesions of area of chief complaint.  Decreased strength and ROM due to elicited pain.  Dorsiflexion and plantarflexion intact.  Stable to varus and valgus stress.  BLE appear grossly neurovascularly intact.  Gait mildly antalgic.   Skin:    General: Skin is warm and dry.     Capillary Refill: Capillary refill takes less than 2 seconds.     Findings: No erythema or rash.  Neurological:     General: No focal deficit present.     Mental Status: He is alert and oriented to person, place, and time.  Psychiatric:        Mood and Affect: Mood normal.        Behavior: Behavior normal.     Vital signs in last 24 hours: @VSRANGES @  Labs:   Estimated body mass index is 31.71 kg/m as calculated from the following:   Height as of 03/26/24: 5' 10 (1.778 m).   Weight as of 03/26/24: 100.2 kg.   Imaging Review Plain radiographs demonstrate severe degenerative joint disease of the left knee(s). The overall alignment issignificant varus. The bone quality appears to be fair for age and reported activity level.      Assessment/Plan:  End stage arthritis, left knee   The patient history, physical examination, clinical judgment of the provider and imaging studies are consistent with end stage degenerative joint  disease of the left knee(s) and total knee arthroplasty is deemed medically necessary. The treatment options including medical management, injection therapy arthroscopy and arthroplasty were discussed at length. The risks and benefits of total knee arthroplasty were presented and reviewed. The risks due to aseptic loosening, infection, stiffness, patella tracking problems, thromboembolic complications and other imponderables were discussed. The patient acknowledged the explanation, agreed to proceed with the plan and consent was signed. Patient is being admitted for inpatient treatment for surgery, pain control, PT, OT, prophylactic antibiotics, VTE prophylaxis, progressive ambulation and ADL's and discharge planning. The patient is planning to be discharged home with OPPT    Anticipated LOS equal to or greater than 2 midnights due to - Age 35 and older with one or more of the following:  - Obesity  - Expected need for hospital services (PT, OT, Nursing) required for safe  discharge  - Anticipated need for postoperative skilled nursing care or inpatient rehab  - Active co-morbidities: lung cancer 2024, CAD, COPD/emphysema, HLD, GERD, peripheral neuropathy OR   - Unanticipated findings during/Post Surgery: None  - Patient is a high risk of re-admission due to: None    [1] No Known Allergies

## 2024-04-06 NOTE — Patient Instructions (Signed)
 SURGICAL WAITING ROOM VISITATION Patients having surgery or a procedure may have no more than 2 support people in the waiting area - these visitors may rotate in the visitor waiting room.   Due to an increase in RSV and influenza rates and associated hospitalizations, children ages 84 and under may not visit patients in [facility] hospitals. If the patient needs to stay at the hospital during part of their recovery, the visitor guidelines for inpatient rooms apply.  PRE-OP VISITATION  Pre-op nurse will coordinate an appropriate time for 1 support person to accompany the patient in pre-op.  This support person may not rotate.  This visitor will be contacted when the time is appropriate for the visitor to come back in the pre-op area.  Please refer to the [facility] website for the visitor guidelines for Inpatients (after your surgery is over and you are in a regular room).  You are not required to quarantine at this time prior to your surgery. However, you must do this: Hand Hygiene often Do NOT share personal items Notify your provider if you are in close contact with someone who has COVID or you develop fever 100.4 or greater, new onset of sneezing, cough, sore throat, shortness of breath or body aches.  If you test positive for Covid or have been in contact with anyone that has tested positive in the last 10 days please notify you surgeon.    Your procedure is scheduled on: 04/20/24   Report to [facility] Main Entrance: [place] entrance where the [employer] is available.   Report to admitting at:   10:15 AM  Call this number if you have any questions or problems the morning of surgery [phone]  FOLLOW ANY ADDITIONAL PRE OP INSTRUCTIONS YOU RECEIVED FROM YOUR SURGEON'S OFFICE!!!  Do not eat food after Midnight the night prior to your surgery/procedure.  After Midnight you may have the following liquids until: 9:45 AM DAY OF SURGERY  Clear Liquid Diet Water Black  Coffee (sugar ok, NO MILK/CREAM OR CREAMERS)  Tea (sugar ok, NO MILK/CREAM OR CREAMERS) regular and decaf                             Plain Jell-O  with no fruit (NO RED)                                           Fruit ices (not with fruit pulp, NO RED)                                     Popsicles (NO RED)                                                                  Juice: NO CITRUS JUICES: only apple, WHITE grape, WHITE cranberry Sports drinks like Gatorade or Powerade (NO RED)   The day of surgery:  Drink ONE (1) Pre-Surgery Clear Ensure at : 9:45 AM the morning of surgery. Drink in one sitting. Do not sip.  This drink  was given to you during your hospital pre-op appointment visit. Nothing else to drink after completing the Pre-Surgery Clear Ensure or G2 : No candy, chewing gum or throat lozenges.    Oral Hygiene is also important to reduce your risk of infection.        Remember - BRUSH YOUR TEETH THE MORNING OF SURGERY WITH YOUR REGULAR TOOTHPASTE  Do NOT smoke after Midnight the night before surgery.  STOP TAKING all Vitamins, Herbs and supplements 1 week before your surgery.   Take ONLY these medicines the morning of surgery with A SIP OF WATER: tamsulosin ,pantoprazole .                   You may not have any metal on your body including hair pins, jewelry, and body piercing  Do not wear lotions, powders, perfumes / cologne, or deodorant  Men may shave face and neck.  Contacts, Hearing Aids, dentures or bridgework may not be worn into surgery. DENTURES WILL BE REMOVED PRIOR TO SURGERY PLEASE DO NOT APPLY Poly grip OR ADHESIVES!!!  You may bring a small overnight bag with you on the day of surgery, only pack items that are not valuable. [facility] IS NOT RESPONSIBLE   FOR VALUABLES THAT ARE LOST OR STOLEN.   Patients discharged on the day of surgery will not be allowed to drive home.  Someone NEEDS to stay with you for the first 24 hours after anesthesia.  Do not bring  your home medications to the hospital. The Pharmacy will dispense medications listed on your medication list to you during your admission in the Hospital.  Special Instructions: Bring a copy of your healthcare power of attorney and living will documents the day of surgery, if you wish to have them scanned into your [facility] Records- EPIC  Please read over the following fact sheets you were given: IF YOU HAVE QUESTIONS ABOUT YOUR PRE-OP INSTRUCTIONS, PLEASE CALL [phone]  PATIENT SIGNATURE_________________________________  NURSE SIGNATURE__________________________________  ________________________________________________________________________  Pre-operative 4 CHG Bath Instructions  DYNA-Hex 4 Chlorhexidine  Gluconate 4% Solution Antiseptic 4 fl. oz   You can play a key role in reducing the risk of infection after surgery. Your skin needs to be as free of germs as possible. You can reduce the number of germs on your skin by washing with CHG (chlorhexidine  gluconate) soap before surgery. CHG is an antiseptic soap that kills germs and continues to kill germs even after washing.   DO NOT use if you have an allergy to chlorhexidine /CHG or antibacterial soaps. If your skin becomes reddened or irritated, stop using the CHG and notify one of our RNs at   Please shower with the CHG soap starting 4 days before surgery using the following schedule:     Please keep in mind the following:  DO NOT shave, including legs and underarms, starting the day of your first shower.   You may shave your face at any point before/day of surgery.  Place clean sheets on your bed the day you start using CHG soap. Use a clean washcloth (not used since being washed) for each shower. DO NOT sleep with pets once you start using the CHG.  CHG Shower Instructions:  If you choose to wash your hair and private area, wash first with your normal shampoo/soap.  After you use shampoo/soap, rinse your hair and  body thoroughly to remove shampoo/soap residue.  Turn the water OFF and apply about 3 tablespoons (45 ml) of CHG soap to a CLEAN  washcloth.  Apply CHG soap ONLY FROM YOUR NECK DOWN TO YOUR TOES (washing for 3-5 minutes)  DO NOT use CHG soap on face, private areas, open wounds, or sores.  Pay special attention to the area where your surgery is being performed.  If you are having back surgery, having someone wash your back for you may be helpful. Wait 2 minutes after CHG soap is applied, then you may rinse off the CHG soap.  Pat dry with a clean towel  Put on clean clothes/pajamas   If you choose to wear lotion, please use ONLY the CHG-compatible lotions on the back of this paper.     Additional instructions for the day of surgery: DO NOT APPLY any lotions, deodorants, cologne, or perfumes.   Put on clean/comfortable clothes.  Brush your teeth.  Ask your nurse before applying any prescription medications to the skin.   CHG Compatible Lotions   Aveeno Moisturizing lotion  Cetaphil Moisturizing Cream  Cetaphil Moisturizing Lotion  Clairol Herbal Essence Moisturizing Lotion, Dry Skin  Clairol Herbal Essence Moisturizing Lotion, Extra Dry Skin  Clairol Herbal Essence Moisturizing Lotion, Normal Skin  Curel Age Defying Therapeutic Moisturizing Lotion with Alpha Hydroxy  Curel Extreme Care Body Lotion  Curel Soothing Hands Moisturizing Hand Lotion  Curel Therapeutic Moisturizing Cream, Fragrance-Free  Curel Therapeutic Moisturizing Lotion, Fragrance-Free  Curel Therapeutic Moisturizing Lotion, Original Formula  Eucerin Daily Replenishing Lotion  Eucerin Dry Skin Therapy Plus Alpha Hydroxy Crme  Eucerin Dry Skin Therapy Plus Alpha Hydroxy Lotion  Eucerin Original Crme  Eucerin Original Lotion  Eucerin Plus Crme Eucerin Plus Lotion  Eucerin TriLipid Replenishing Lotion  Keri Anti-Bacterial Hand Lotion  Keri Deep Conditioning Original Lotion Dry Skin Formula Softly Scented  Keri Deep  Conditioning Original Lotion, Fragrance Free Sensitive Skin Formula  Keri Lotion Fast Absorbing Fragrance Free Sensitive Skin Formula  Keri Lotion Fast Absorbing Softly Scented Dry Skin Formula  Keri Original Lotion  Keri Skin Renewal Lotion Keri Silky Smooth Lotion  Keri Silky Smooth Sensitive Skin Lotion  Nivea Body Creamy Conditioning Oil  Nivea Body Extra Enriched Lotion  Nivea Body Original Lotion  Nivea Body Sheer Moisturizing Lotion Nivea Crme  Nivea Skin Firming Lotion  NutraDerm 30 Skin Lotion  NutraDerm Skin Lotion  NutraDerm Therapeutic Skin Cream  NutraDerm Therapeutic Skin Lotion  ProShield Protective Hand Cream  Provon moisturizing lotion  Incentive Spirometer  An incentive spirometer is a tool that can help keep your lungs clear and active. This tool measures how well you are filling your lungs with each breath. Taking long deep breaths may help reverse or decrease the chance of developing breathing (pulmonary) problems (especially infection) following: A long period of time when you are unable to move or be active. BEFORE THE PROCEDURE  If the spirometer includes an indicator to show your best effort, your nurse or respiratory therapist will set it to a desired goal. If possible, sit up straight or lean slightly forward. Try not to slouch. Hold the incentive spirometer in an upright position. INSTRUCTIONS FOR USE  Sit on the edge of your bed if possible, or sit up as far as you can in bed or on a chair. Hold the incentive spirometer in an upright position. Breathe out normally. Place the mouthpiece in your mouth and seal your lips tightly around it. Breathe in slowly and as deeply as possible, raising the piston or the ball toward the top of the column. Hold your breath for 3-5 seconds or for as long as  possible. Allow the piston or ball to fall to the bottom of the column. Remove the mouthpiece from your mouth and breathe out normally. Rest for a few seconds and  repeat Steps 1 through 7 at least 10 times every 1-2 hours when you are awake. Take your time and take a few normal breaths between deep breaths. The spirometer may include an indicator to show your best effort. Use the indicator as a goal to work toward during each repetition. After each set of 10 deep breaths, practice coughing to be sure your lungs are clear. If you have an incision (the cut made at the time of surgery), support your incision when coughing by placing a pillow or rolled up towels firmly against it. Once you are able to get out of bed, walk around indoors and cough well. You may stop using the incentive spirometer when instructed by your caregiver.  RISKS AND COMPLICATIONS Take your time so you do not get dizzy or light-headed. If you are in pain, you may need to take or ask for pain medication before doing incentive spirometry. It is harder to take a deep breath if you are having pain. AFTER USE Rest and breathe slowly and easily. It can be helpful to keep track of a log of your progress. Your caregiver can provide you with a simple table to help with this. If you are using the spirometer at home, follow these instructions: SEEK MEDICAL CARE IF:  You are having difficultly using the spirometer. You have trouble using the spirometer as often as instructed. Your pain medication is not giving enough relief while using the spirometer. You develop fever of 100.5 F (38.1 C) or higher. SEEK IMMEDIATE MEDICAL CARE IF:  You cough up bloody sputum that had not been present before. You develop fever of 102 F (38.9 C) or greater. You develop worsening pain at or near the incision site. MAKE SURE YOU:  Understand these instructions. Will watch your condition. Will get help right away if you are not doing well or get worse. Document Released: 08/13/2006 Document Revised: 06/25/2011 Document Reviewed: 10/14/2006 [facility] Patient Information 2014 [place],  [place].   ________________________________________________________________________

## 2024-04-06 NOTE — H&P (Signed)
 TOTAL KNEE ADMISSION H&P  Patient is being admitted for left total knee arthroplasty.  Subjective:  Chief Complaint:left knee pain.  HPI: Brandon Schultz, 73 y.o. male, has a history of pain and functional disability in the left knee due to arthritis and has failed non-surgical conservative treatments for greater than 12 weeks to includeNSAID's and/or analgesics, supervised PT with diminished ADL's post treatment, use of assistive devices, and activity modification.  Onset of symptoms was gradual, starting many years ago with gradually worsening course since that time. The patient noted prior procedures on the knee to include  ACL reconstruction x3 on the left knee(s).  Patient currently rates pain in the left knee(s) at 8 out of 10 with activity. Patient has night pain, worsening of pain with activity and weight bearing, pain that interferes with activities of daily living, and pain with passive range of motion.  Patient has evidence of periarticular osteophytes and joint space narrowing by imaging studies.  There is no active infection.  Patient Active Problem List   Diagnosis Date Noted   Dysuria 03/24/2024   Acute bronchitis with COPD (HCC) 02/17/2024   Nasal polyp 02/17/2024   Peripheral neuropathy due to chemotherapy 02/17/2024   Right upper lobe pulmonary nodule 03/07/2023   Encounter for antineoplastic immunotherapy 01/02/2023   Encounter for antineoplastic chemotherapy 01/02/2023   Lung cancer (HCC) 10/08/2022   COPD (chronic obstructive pulmonary disease) (HCC) 07/06/2022   Nocturia 03/05/2019   Urinary frequency 03/05/2019   Acute bilateral low back pain without sciatica 05/20/2018   History of BPH 03/27/2018   Colon polyps    Persistent cough 05/16/2016   Elevated blood sugar 05/16/2016   Tinnitus, bilateral 05/09/2016   Sensorineural hearing loss (SNHL), bilateral 03/29/2016   Medicare annual wellness visit, subsequent 08/18/2014   Hemorrhoids 07/08/2012   Mixed  hyperlipidemia 07/28/2008   DEPRESSION 07/28/2008   GERD 07/28/2008   Osteoarthritis 07/28/2008   Past Medical History:  Diagnosis Date   Allergy    seasonal   Arthritis    Asthma    Cervical stenosis of spine    Colon polyps    Complication of anesthesia    COPD (chronic obstructive pulmonary disease) (HCC)    Diverticulitis    Emphysema    GERD (gastroesophageal reflux disease)    Gum disease    H/O degenerative disc disease    Hemorrhoids    HOH (hard of hearing)    Inguinal hernia    Melanoma (HCC)    facial   PONV (postoperative nausea and vomiting)    Renal disease    Bright's Disease-childhood   Syncope    Umbilical hernia    Urinary tract infection    Von Willebrand disease (HCC)     Past Surgical History:  Procedure Laterality Date   BRONCHIAL BIOPSY  09/25/2022   Procedure: BRONCHIAL BIOPSIES;  Surgeon: Shelah Lamar RAMAN, MD;  Location: [facility] ENDOSCOPY;  Service: Pulmonary;;   BRONCHIAL BRUSHINGS  09/25/2022   Procedure: BRONCHIAL BRUSHINGS;  Surgeon: Shelah Lamar RAMAN, MD;  Location: [facility] ENDOSCOPY;  Service: Pulmonary;;   BRONCHIAL NEEDLE ASPIRATION BIOPSY  09/25/2022   Procedure: BRONCHIAL NEEDLE ASPIRATION BIOPSIES;  Surgeon: Shelah Lamar RAMAN, MD;  Location: [facility] ENDOSCOPY;  Service: Pulmonary;;   cataract surgery Bilateral 11/2020   COLONOSCOPY  06/08/2021   07/27/2016   DENTAL SURGERY     FIDUCIAL MARKER PLACEMENT  09/25/2022   Procedure: FIDUCIAL MARKER PLACEMENT;  Surgeon: Shelah Lamar RAMAN, MD;  Location: [facility] ENDOSCOPY;  Service: Pulmonary;;  HERNIA REPAIR     Umbilical & Inguinal   INTERCOSTAL NERVE BLOCK Right 03/07/2023   Procedure: INTERCOSTAL NERVE BLOCK, RIGHT CHEST;  Surgeon: Kerrin Elspeth BROCKS, MD;  Location: [facility] OR;  Service: Thoracic;  Laterality: Right;   left knee surgery     LYMPH NODE DISSECTION Right 03/07/2023   Procedure: LYMPH NODE DISSECTION, RIGHT LUNG;  Surgeon: Kerrin Elspeth BROCKS, MD;  Location: [facility] OR;  Service: Thoracic;  Laterality:  Right;   MELANOMA EXCISION     facial   neck and shoulder injection     POLYPECTOMY     WISDOM TOOTH EXTRACTION      Current Outpatient Medications  Medication Sig Dispense Refill Last Dose/Taking   cyanocobalamin (VITAMIN B12) 500 MCG tablet Take 500 mcg by mouth daily.      pantoprazole  (PROTONIX ) 40 MG tablet Take 1 tablet (40 mg total) by mouth 2 (two) times daily. 180 tablet 2    rosuvastatin  (CRESTOR ) 20 MG tablet Take 1 tablet (20 mg total) by mouth daily. 90 tablet 0    tamsulosin  (FLOMAX ) 0.4 MG CAPS capsule Take 1 capsule (0.4 mg total) by mouth daily. 30 capsule 3    timolol  (TIMOPTIC ) 0.5 % ophthalmic solution Place 1 drop into the left eye every morning.      No current facility-administered medications for this visit.   Allergies[1]  Social History   Tobacco Use   Smoking status: Former    Current packs/day: 0.00    Average packs/day: 2.0 packs/day for 40.0 years (80.0 ttl pk-yrs)    Types: Cigarettes    Start date: 04/16/1974    Quit date: 04/16/2014    Years since quitting: 9.9   Smokeless tobacco: Former  Substance Use Topics   Alcohol use: Not Currently    Alcohol/week: 7.0 standard drinks of alcohol    Types: 7 Standard drinks or equivalent per week    Family History  Problem Relation Age of Onset   Colon cancer Mother    Heart attack Father        Deceased-Early 71s   Syncope episode Sister    Healthy Sister        x1   Dementia Maternal Grandmother    Cancer Maternal Grandfather    Healthy Daughter        x2   Healthy Son        x2   Colon polyps Neg Hx    Rectal cancer Neg Hx    Stomach cancer Neg Hx      Review of Systems  Musculoskeletal:  Positive for arthralgias.  All other systems reviewed and are negative.   Objective:  Physical Exam Constitutional:      General: He is not in acute distress.    Appearance: Normal appearance. He is normal weight.  HENT:     Head: Normocephalic and atraumatic.  Eyes:     Extraocular Movements:  Extraocular movements intact.     Conjunctiva/sclera: Conjunctivae normal.     Pupils: Pupils are equal, round, and reactive to light.  Cardiovascular:     Rate and Rhythm: Normal rate and regular rhythm.     Pulses: Normal pulses.     Heart sounds: Normal heart sounds.  Pulmonary:     Effort: Pulmonary effort is normal. No respiratory distress.  Abdominal:     General: There is no distension.     Palpations: Abdomen is soft.  Musculoskeletal:        General: Tenderness present.  Cervical back: Normal range of motion and neck supple.     Comments: TTP over medial and lateral joint line, medial worse than lateral.  No calf tenderness, swelling, or erythema.  No overlying lesions of area of chief complaint.  Decreased strength and ROM due to elicited pain.  Dorsiflexion and plantarflexion intact.  Stable to varus and valgus stress.  BLE appear grossly neurovascularly intact.  Gait mildly antalgic.   Skin:    General: Skin is warm and dry.     Capillary Refill: Capillary refill takes less than 2 seconds.     Findings: No erythema or rash.  Neurological:     General: No focal deficit present.     Mental Status: He is alert and oriented to person, place, and time.  Psychiatric:        Mood and Affect: Mood normal.        Behavior: Behavior normal.     Vital signs in last 24 hours: @VSRANGES @  Labs:   Estimated body mass index is 31.71 kg/m as calculated from the following:   Height as of 03/26/24: 5' 10 (1.778 m).   Weight as of 03/26/24: 100.2 kg.   Imaging Review Plain radiographs demonstrate severe degenerative joint disease of the left knee(s). The overall alignment issignificant varus. The bone quality appears to be fair for age and reported activity level.      Assessment/Plan:  End stage arthritis, left knee   The patient history, physical examination, clinical judgment of the provider and imaging studies are consistent with end stage degenerative joint  disease of the left knee(s) and total knee arthroplasty is deemed medically necessary. The treatment options including medical management, injection therapy arthroscopy and arthroplasty were discussed at length. The risks and benefits of total knee arthroplasty were presented and reviewed. The risks due to aseptic loosening, infection, stiffness, patella tracking problems, thromboembolic complications and other imponderables were discussed. The patient acknowledged the explanation, agreed to proceed with the plan and consent was signed. Patient is being admitted for inpatient treatment for surgery, pain control, PT, OT, prophylactic antibiotics, VTE prophylaxis, progressive ambulation and ADL's and discharge planning. The patient is planning to be discharged home with OPPT    Anticipated LOS equal to or greater than 2 midnights due to - Age 35 and older with one or more of the following:  - Obesity  - Expected need for hospital services (PT, OT, Nursing) required for safe  discharge  - Anticipated need for postoperative skilled nursing care or inpatient rehab  - Active co-morbidities: lung cancer 2024, CAD, COPD/emphysema, HLD, GERD, peripheral neuropathy OR   - Unanticipated findings during/Post Surgery: None  - Patient is a high risk of re-admission due to: None    [1] No Known Allergies

## 2024-04-07 ENCOUNTER — Encounter (HOSPITAL_COMMUNITY)
Admission: RE | Admit: 2024-04-07 | Discharge: 2024-04-07 | Disposition: A | Discharge status: Home / Self Care | Source: Ambulatory Visit | Attending: Orthopedic Surgery | Admitting: Orthopedic Surgery

## 2024-04-07 ENCOUNTER — Encounter (HOSPITAL_COMMUNITY): Payer: Self-pay

## 2024-04-07 ENCOUNTER — Other Ambulatory Visit: Payer: Self-pay

## 2024-04-07 VITALS — BP 152/81 | HR 70 | Temp 97.6°F | Ht 70.0 in | Wt 215.0 lb

## 2024-04-07 DIAGNOSIS — G8929 Other chronic pain: Secondary | ICD-10-CM | POA: Insufficient documentation

## 2024-04-07 DIAGNOSIS — M25562 Pain in left knee: Secondary | ICD-10-CM | POA: Insufficient documentation

## 2024-04-07 DIAGNOSIS — Z01812 Encounter for preprocedural laboratory examination: Secondary | ICD-10-CM | POA: Diagnosis not present

## 2024-04-07 DIAGNOSIS — Z01818 Encounter for other preprocedural examination: Secondary | ICD-10-CM | POA: Diagnosis present

## 2024-04-07 DIAGNOSIS — I10 Essential (primary) hypertension: Secondary | ICD-10-CM | POA: Insufficient documentation

## 2024-04-07 HISTORY — DX: Atherosclerotic heart disease of native coronary artery without angina pectoris: I25.10

## 2024-04-07 HISTORY — DX: Dyspnea, unspecified: R06.00

## 2024-04-07 LAB — CBC WITH DIFFERENTIAL/PLATELET
Abs Immature Granulocytes: 0.02 K/uL (ref 0.00–0.07)
Basophils Absolute: 0 K/uL (ref 0.0–0.1)
Basophils Relative: 0 %
Eosinophils Absolute: 0.5 K/uL (ref 0.0–0.5)
Eosinophils Relative: 5 %
HCT: 44.5 % (ref 39.0–52.0)
Hemoglobin: 14.8 g/dL (ref 13.0–17.0)
Immature Granulocytes: 0 %
Lymphocytes Relative: 23 %
Lymphs Abs: 2.1 K/uL (ref 0.7–4.0)
MCH: 30.3 pg (ref 26.0–34.0)
MCHC: 33.3 g/dL (ref 30.0–36.0)
MCV: 91 fL (ref 80.0–100.0)
Monocytes Absolute: 0.7 K/uL (ref 0.1–1.0)
Monocytes Relative: 7 %
Neutro Abs: 5.8 K/uL (ref 1.7–7.7)
Neutrophils Relative %: 65 %
Platelets: 276 K/uL (ref 150–400)
RBC: 4.89 MIL/uL (ref 4.22–5.81)
RDW: 13.6 % (ref 11.5–15.5)
WBC: 9 K/uL (ref 4.0–10.5)
nRBC: 0 % (ref 0.0–0.2)

## 2024-04-07 LAB — COMPREHENSIVE METABOLIC PANEL WITH GFR
ALT: 14 U/L (ref 0–44)
AST: 18 U/L (ref 15–41)
Albumin: 4.5 g/dL (ref 3.5–5.0)
Alkaline Phosphatase: 68 U/L (ref 38–126)
Anion gap: 10 (ref 5–15)
BUN: 14 mg/dL (ref 8–23)
CO2: 24 mmol/L (ref 22–32)
Calcium: 9.6 mg/dL (ref 8.9–10.3)
Chloride: 107 mmol/L (ref 98–111)
Creatinine, Ser: 0.8 mg/dL (ref 0.61–1.24)
GFR, Estimated: >60 mL/min
Glucose, Bld: 106 mg/dL — ABNORMAL HIGH (ref 70–99)
Potassium: 4.1 mmol/L (ref 3.5–5.1)
Sodium: 141 mmol/L (ref 135–145)
Total Bilirubin: 0.4 mg/dL (ref 0.0–1.2)
Total Protein: 7.5 g/dL (ref 6.5–8.1)

## 2024-04-07 LAB — SURGICAL PCR SCREEN
MRSA, PCR: NEGATIVE
Staphylococcus aureus: NEGATIVE

## 2024-04-07 LAB — TYPE AND SCREEN
ABO/RH(D): O POS
Antibody Screen: NEGATIVE

## 2024-04-07 NOTE — Progress Notes (Signed)
 For Anesthesia: PCP - Gayle Saddie FALCON, PA-C  Cardiologist - Kate Lonni CROME, MD  Clearance: Emelia Josefa HERO, NP : 03/02/24 Bowel Prep reminder:N/A  Chest x-ray - CT Chest: 03/21/24 EKG - 02/19/24 Stress Test - 04/2021 ECHO - 05/22/21 Cardiac Cath -  Pacemaker/ICD device last checked: Pacemaker orders received: Device Rep notified:  Spinal Cord Stimulator:N/A  Sleep Study - N/A CPAP -   Fasting Blood Sugar - N/A Checks Blood Sugar _____ times a day Date and result of last Hgb A1c-  Last dose of GLP1 agonist- N/A GLP1 instructions: Hold 7 days prior to schedule (Hold 24 hours-daily)   Last dose of SGLT-2 inhibitors- N/A SGLT-2 instructions: Hold 72 hours prior to surgery  Blood Thinner Instructions:N/A Last Dose: Time last taken:  Aspirin Instructions:N/A Last Dose: Time last taken:  Activity level: Can go up a flight of stairs and activities of daily living without stopping and without chest pain and/or shortness of breath   Able to exercise without chest pain and/or shortness of breath  Anesthesia review: Hx: CAD,CKD,COPD  Patient denies shortness of breath, fever, cough and chest pain at PAT appointment   Patient verbalized understanding of instructions that were reviewed over the telephone.

## 2024-04-20 ENCOUNTER — Observation Stay (HOSPITAL_COMMUNITY)

## 2024-04-20 ENCOUNTER — Encounter (HOSPITAL_COMMUNITY): Payer: Self-pay | Admitting: Orthopedic Surgery

## 2024-04-20 ENCOUNTER — Encounter (HOSPITAL_COMMUNITY)
Admission: RE | Disposition: A | Discharge status: Home / Self Care | Payer: Self-pay | Source: Home / Self Care | Attending: Orthopedic Surgery

## 2024-04-20 ENCOUNTER — Ambulatory Visit (HOSPITAL_BASED_OUTPATIENT_CLINIC_OR_DEPARTMENT_OTHER): Payer: Self-pay | Admitting: Anesthesiology

## 2024-04-20 ENCOUNTER — Other Ambulatory Visit: Payer: Self-pay

## 2024-04-20 ENCOUNTER — Encounter (HOSPITAL_COMMUNITY): Payer: Self-pay | Admitting: Anesthesiology

## 2024-04-20 ENCOUNTER — Observation Stay (HOSPITAL_COMMUNITY)
Admission: RE | Admit: 2024-04-20 | Discharge: 2024-04-21 | Disposition: A | Discharge status: Home / Self Care | Attending: Orthopedic Surgery | Admitting: Orthopedic Surgery

## 2024-04-20 DIAGNOSIS — M1712 Unilateral primary osteoarthritis, left knee: Secondary | ICD-10-CM | POA: Diagnosis not present

## 2024-04-20 DIAGNOSIS — I2510 Atherosclerotic heart disease of native coronary artery without angina pectoris: Secondary | ICD-10-CM

## 2024-04-20 DIAGNOSIS — M25562 Pain in left knee: Secondary | ICD-10-CM | POA: Diagnosis present

## 2024-04-20 DIAGNOSIS — I10 Essential (primary) hypertension: Secondary | ICD-10-CM

## 2024-04-20 DIAGNOSIS — E785 Hyperlipidemia, unspecified: Secondary | ICD-10-CM

## 2024-04-20 DIAGNOSIS — J4489 Other specified chronic obstructive pulmonary disease: Secondary | ICD-10-CM | POA: Diagnosis not present

## 2024-04-20 DIAGNOSIS — J449 Chronic obstructive pulmonary disease, unspecified: Secondary | ICD-10-CM | POA: Diagnosis not present

## 2024-04-20 HISTORY — PX: TOTAL KNEE ARTHROPLASTY: SHX125

## 2024-04-20 SURGERY — ARTHROPLASTY, KNEE, TOTAL
Anesthesia: General | Site: Knee | Laterality: Left

## 2024-04-20 MED ORDER — HYDROMORPHONE HCL 1 MG/ML IJ SOLN
INTRAMUSCULAR | Status: DC | PRN
  Administered 2024-04-20: .5 mg via INTRAVENOUS

## 2024-04-20 MED ORDER — ACETAMINOPHEN 500 MG PO TABS: 1000.0000 mg | ORAL_TABLET | Freq: Three times a day (TID) | ORAL | Status: AC | PRN

## 2024-04-20 MED ORDER — ORAL CARE MOUTH RINSE: 15.0000 mL | Freq: Once | OROMUCOSAL | Status: AC

## 2024-04-20 MED ORDER — HYDROMORPHONE HCL 1 MG/ML IJ SOLN: 0.5000 mg | INTRAMUSCULAR | Status: DC | PRN

## 2024-04-20 MED ORDER — ONDANSETRON HCL 4 MG/2ML IJ SOLN: 4.0000 mg | Freq: Four times a day (QID) | INTRAMUSCULAR | Status: DC | PRN

## 2024-04-20 MED ORDER — SODIUM CHLORIDE 0.9 % IV SOLN: INTRAVENOUS | Status: DC

## 2024-04-20 MED ORDER — LIDOCAINE HCL (PF) 2 % IJ SOLN
INTRAMUSCULAR | Status: AC
  Filled 2024-04-20: qty 10

## 2024-04-20 MED ORDER — AMISULPRIDE (ANTIEMETIC) 5 MG/2ML IV SOLN: 10.0000 mg | Freq: Once | INTRAVENOUS | Status: DC | PRN

## 2024-04-20 MED ORDER — ROCURONIUM BROMIDE 10 MG/ML (PF) SYRINGE
PREFILLED_SYRINGE | INTRAVENOUS | Status: AC
  Filled 2024-04-20: qty 10

## 2024-04-20 MED ORDER — MENTHOL 3 MG MT LOZG: 1.0000 | LOZENGE | OROMUCOSAL | Status: DC | PRN

## 2024-04-20 MED ORDER — BUPIVACAINE LIPOSOME 1.3 % IJ SUSP
INTRAMUSCULAR | Status: AC
  Filled 2024-04-20: qty 20

## 2024-04-20 MED ORDER — ASPIRIN 81 MG PO TBEC: 81.0000 mg | DELAYED_RELEASE_TABLET | Freq: Two times a day (BID) | ORAL | Status: AC

## 2024-04-20 MED ORDER — FENTANYL CITRATE (PF) 50 MCG/ML IJ SOSY
50.0000 ug | PREFILLED_SYRINGE | INTRAMUSCULAR | Status: DC
  Administered 2024-04-20: 50 ug via INTRAVENOUS
  Filled 2024-04-20: qty 2

## 2024-04-20 MED ORDER — SODIUM CHLORIDE (PF) 0.9 % IJ SOLN
INTRAMUSCULAR | Status: AC
  Filled 2024-04-20: qty 30

## 2024-04-20 MED ORDER — DEXMEDETOMIDINE HCL IN NACL 80 MCG/20ML IV SOLN
INTRAVENOUS | Status: DC | PRN
  Administered 2024-04-20: 12 ug via INTRAVENOUS

## 2024-04-20 MED ORDER — WATER FOR IRRIGATION, STERILE IR SOLN
Status: DC | PRN
  Administered 2024-04-20: 1000 mL

## 2024-04-20 MED ORDER — LACTATED RINGERS IV SOLN: INTRAVENOUS | Status: DC

## 2024-04-20 MED ORDER — OXYCODONE HCL 5 MG PO TABS: 5.0000 mg | ORAL_TABLET | ORAL | 0 refills | Status: AC | PRN

## 2024-04-20 MED ORDER — PROPOFOL 10 MG/ML IV BOLUS
INTRAVENOUS | Status: DC | PRN
  Administered 2024-04-20: 40 mg via INTRAVENOUS
  Administered 2024-04-20: 100 mg via INTRAVENOUS
  Administered 2024-04-20: 160 mg via INTRAVENOUS

## 2024-04-20 MED ORDER — ROSUVASTATIN CALCIUM 20 MG PO TABS
20.0000 mg | ORAL_TABLET | Freq: Every day | ORAL | Status: DC
  Administered 2024-04-21: 20 mg via ORAL
  Filled 2024-04-20: qty 1

## 2024-04-20 MED ORDER — ACETAMINOPHEN 500 MG PO TABS
1000.0000 mg | ORAL_TABLET | Freq: Four times a day (QID) | ORAL | Status: AC
  Administered 2024-04-20 – 2024-04-21 (×4): 1000 mg via ORAL
  Filled 2024-04-20 (×4): qty 2

## 2024-04-20 MED ORDER — DEXAMETHASONE SOD PHOSPHATE PF 10 MG/ML IJ SOLN
8.0000 mg | Freq: Once | INTRAMUSCULAR | Status: AC
  Administered 2024-04-20: 8 mg via INTRAVENOUS

## 2024-04-20 MED ORDER — SODIUM CHLORIDE 0.9 % IR SOLN
Status: DC | PRN
  Administered 2024-04-20: 3000 mL

## 2024-04-20 MED ORDER — POVIDONE-IODINE 10 % EX SWAB
2.0000 | Freq: Once | CUTANEOUS | Status: AC
  Administered 2024-04-20: 2 via TOPICAL

## 2024-04-20 MED ORDER — DOCUSATE SODIUM 100 MG PO CAPS
100.0000 mg | ORAL_CAPSULE | Freq: Two times a day (BID) | ORAL | Status: DC
  Administered 2024-04-20 – 2024-04-21 (×2): 100 mg via ORAL
  Filled 2024-04-20 (×2): qty 1

## 2024-04-20 MED ORDER — OXYCODONE HCL 5 MG/5ML PO SOLN: 5.0000 mg | Freq: Once | ORAL | Status: DC | PRN

## 2024-04-20 MED ORDER — ACETAMINOPHEN 325 MG PO TABS: 325.0000 mg | ORAL_TABLET | Freq: Four times a day (QID) | ORAL | Status: DC | PRN

## 2024-04-20 MED ORDER — PROPOFOL 500 MG/50ML IV EMUL
INTRAVENOUS | Status: DC | PRN
  Administered 2024-04-20: 150 ug/kg/min via INTRAVENOUS

## 2024-04-20 MED ORDER — HYDRALAZINE HCL 20 MG/ML IJ SOLN
INTRAMUSCULAR | Status: AC
  Filled 2024-04-20: qty 1

## 2024-04-20 MED ORDER — ONDANSETRON HCL 4 MG/2ML IJ SOLN
INTRAMUSCULAR | Status: DC | PRN
  Administered 2024-04-20: 4 mg via INTRAVENOUS

## 2024-04-20 MED ORDER — LIDOCAINE HCL (CARDIAC) PF 100 MG/5ML IV SOSY
PREFILLED_SYRINGE | INTRAVENOUS | Status: DC | PRN
  Administered 2024-04-20: 50 mg via INTRAVENOUS
  Administered 2024-04-20: 20 mg via INTRAVENOUS

## 2024-04-20 MED ORDER — MIDAZOLAM HCL (PF) 2 MG/2ML IJ SOLN: 1.0000 mg | INTRAMUSCULAR | Status: DC

## 2024-04-20 MED ORDER — FENTANYL CITRATE (PF) 100 MCG/2ML IJ SOLN
INTRAMUSCULAR | Status: DC | PRN
  Administered 2024-04-20 (×4): 50 ug via INTRAVENOUS

## 2024-04-20 MED ORDER — SUGAMMADEX SODIUM 200 MG/2ML IV SOLN
INTRAVENOUS | Status: DC | PRN
  Administered 2024-04-20: 200 mg via INTRAVENOUS

## 2024-04-20 MED ORDER — OXYCODONE HCL 5 MG PO TABS: 5.0000 mg | ORAL_TABLET | Freq: Once | ORAL | Status: DC | PRN

## 2024-04-20 MED ORDER — ACETAMINOPHEN 500 MG PO TABS
1000.0000 mg | ORAL_TABLET | Freq: Once | ORAL | Status: AC
  Administered 2024-04-20: 1000 mg via ORAL
  Filled 2024-04-20: qty 2

## 2024-04-20 MED ORDER — CEFAZOLIN SODIUM-DEXTROSE 2-4 GM/100ML-% IV SOLN
2.0000 g | INTRAVENOUS | Status: AC
  Administered 2024-04-20: 2 g via INTRAVENOUS
  Filled 2024-04-20: qty 100

## 2024-04-20 MED ORDER — LACTATED RINGERS IV SOLN: INTRAVENOUS | Status: DC | PRN

## 2024-04-20 MED ORDER — ROPIVACAINE HCL 5 MG/ML IJ SOLN
INTRAMUSCULAR | Status: DC | PRN
  Administered 2024-04-20: 20 mL via PERINEURAL

## 2024-04-20 MED ORDER — SODIUM CHLORIDE 0.9% FLUSH
INTRAVENOUS | Status: DC | PRN
  Administered 2024-04-20: 30 mL

## 2024-04-20 MED ORDER — EPHEDRINE SULFATE-NACL 50-0.9 MG/10ML-% IV SOSY
PREFILLED_SYRINGE | INTRAVENOUS | Status: DC | PRN
  Administered 2024-04-20 (×2): 10 mL via INTRAVENOUS

## 2024-04-20 MED ORDER — FENTANYL CITRATE (PF) 100 MCG/2ML IJ SOLN
INTRAMUSCULAR | Status: AC
  Filled 2024-04-20: qty 2

## 2024-04-20 MED ORDER — ZOLPIDEM TARTRATE 5 MG PO TABS: 5.0000 mg | ORAL_TABLET | Freq: Every evening | ORAL | Status: DC | PRN

## 2024-04-20 MED ORDER — EPHEDRINE 5 MG/ML INJ
INTRAVENOUS | Status: AC
  Filled 2024-04-20: qty 15

## 2024-04-20 MED ORDER — CEFAZOLIN SODIUM-DEXTROSE 2-4 GM/100ML-% IV SOLN
2.0000 g | Freq: Four times a day (QID) | INTRAVENOUS | Status: AC
  Administered 2024-04-20 – 2024-04-21 (×2): 2 g via INTRAVENOUS
  Filled 2024-04-20 (×2): qty 100

## 2024-04-20 MED ORDER — CHLORHEXIDINE GLUCONATE 0.12 % MT SOLN
15.0000 mL | Freq: Once | OROMUCOSAL | Status: AC
  Administered 2024-04-20: 15 mL via OROMUCOSAL

## 2024-04-20 MED ORDER — ONDANSETRON HCL 4 MG PO TABS: 4.0000 mg | ORAL_TABLET | Freq: Four times a day (QID) | ORAL | Status: DC | PRN

## 2024-04-20 MED ORDER — PHENYLEPHRINE HCL (PRESSORS) 10 MG/ML IV SOLN
INTRAVENOUS | Status: AC
  Filled 2024-04-20: qty 1

## 2024-04-20 MED ORDER — HYDROMORPHONE HCL 2 MG/ML IJ SOLN
INTRAMUSCULAR | Status: AC
  Filled 2024-04-20: qty 1

## 2024-04-20 MED ORDER — PHENYLEPHRINE 80 MCG/ML (10ML) SYRINGE FOR IV PUSH (FOR BLOOD PRESSURE SUPPORT)
PREFILLED_SYRINGE | INTRAVENOUS | Status: DC | PRN
  Administered 2024-04-20 (×3): 160 ug via INTRAVENOUS

## 2024-04-20 MED ORDER — POLYETHYLENE GLYCOL 3350 17 G PO PACK: 17.0000 g | PACK | Freq: Every day | ORAL | Status: DC | PRN

## 2024-04-20 MED ORDER — KETOROLAC TROMETHAMINE 15 MG/ML IJ SOLN
7.5000 mg | Freq: Four times a day (QID) | INTRAMUSCULAR | Status: AC
  Administered 2024-04-20 – 2024-04-21 (×4): 7.5 mg via INTRAVENOUS
  Filled 2024-04-20 (×4): qty 1

## 2024-04-20 MED ORDER — FENTANYL CITRATE (PF) 50 MCG/ML IJ SOSY: 25.0000 ug | PREFILLED_SYRINGE | INTRAMUSCULAR | Status: DC | PRN

## 2024-04-20 MED ORDER — METHOCARBAMOL 500 MG PO TABS: 500.0000 mg | ORAL_TABLET | Freq: Three times a day (TID) | ORAL | 0 refills | Status: AC | PRN

## 2024-04-20 MED ORDER — BUPIVACAINE-EPINEPHRINE 0.25% -1:200000 IJ SOLN
INTRAMUSCULAR | Status: DC | PRN
  Administered 2024-04-20: 30 mL

## 2024-04-20 MED ORDER — CELECOXIB 100 MG PO CAPS: 100.0000 mg | ORAL_CAPSULE | Freq: Two times a day (BID) | ORAL | 0 refills | Status: AC

## 2024-04-20 MED ORDER — BUPIVACAINE LIPOSOME 1.3 % IJ SUSP: 20.0000 mL | Freq: Once | INTRAMUSCULAR | Status: DC

## 2024-04-20 MED ORDER — 0.9 % SODIUM CHLORIDE (POUR BTL) OPTIME
TOPICAL | Status: DC | PRN
  Administered 2024-04-20: 1000 mL

## 2024-04-20 MED ORDER — PANTOPRAZOLE SODIUM 40 MG PO TBEC
40.0000 mg | DELAYED_RELEASE_TABLET | Freq: Every day | ORAL | Status: DC
  Administered 2024-04-20 – 2024-04-21 (×2): 40 mg via ORAL
  Filled 2024-04-20 (×2): qty 1

## 2024-04-20 MED ORDER — BUPIVACAINE-EPINEPHRINE (PF) 0.25% -1:200000 IJ SOLN
INTRAMUSCULAR | Status: AC
  Filled 2024-04-20: qty 30

## 2024-04-20 MED ORDER — ONDANSETRON HCL 4 MG PO TABS: 4.0000 mg | ORAL_TABLET | Freq: Three times a day (TID) | ORAL | 0 refills | Status: AC | PRN

## 2024-04-20 MED ORDER — PHENYLEPHRINE HCL-NACL 20-0.9 MG/250ML-% IV SOLN
INTRAVENOUS | Status: DC | PRN
  Administered 2024-04-20: 25 ug/min via INTRAVENOUS

## 2024-04-20 MED ORDER — BUPIVACAINE LIPOSOME 1.3 % IJ SUSP
INTRAMUSCULAR | Status: DC | PRN
  Administered 2024-04-20: 20 mL

## 2024-04-20 MED ORDER — PHENOL 1.4 % MT LIQD: 1.0000 | OROMUCOSAL | Status: DC | PRN

## 2024-04-20 MED ORDER — ONDANSETRON HCL 4 MG/2ML IJ SOLN
INTRAMUSCULAR | Status: AC
  Filled 2024-04-20: qty 12

## 2024-04-20 MED ORDER — DIPHENHYDRAMINE HCL 12.5 MG/5ML PO ELIX
12.5000 mg | ORAL_SOLUTION | ORAL | Status: DC | PRN
  Administered 2024-04-21: 25 mg via ORAL
  Filled 2024-04-20: qty 10

## 2024-04-20 MED ORDER — OXYCODONE HCL 5 MG PO TABS
5.0000 mg | ORAL_TABLET | ORAL | Status: DC | PRN
  Administered 2024-04-20: 5 mg via ORAL
  Administered 2024-04-20 – 2024-04-21 (×3): 10 mg via ORAL
  Filled 2024-04-20: qty 1
  Filled 2024-04-20 (×3): qty 2

## 2024-04-20 MED ORDER — METHOCARBAMOL 500 MG PO TABS
500.0000 mg | ORAL_TABLET | Freq: Four times a day (QID) | ORAL | Status: DC | PRN
  Administered 2024-04-20: 500 mg via ORAL
  Filled 2024-04-20: qty 1

## 2024-04-20 MED ORDER — TAMSULOSIN HCL 0.4 MG PO CAPS
0.4000 mg | ORAL_CAPSULE | Freq: Every day | ORAL | Status: DC
  Administered 2024-04-20 – 2024-04-21 (×2): 0.4 mg via ORAL
  Filled 2024-04-20 (×2): qty 1

## 2024-04-20 MED ORDER — ROCURONIUM BROMIDE 100 MG/10ML IV SOLN
INTRAVENOUS | Status: DC | PRN
  Administered 2024-04-20: 20 mg via INTRAVENOUS
  Administered 2024-04-20: 60 mg via INTRAVENOUS

## 2024-04-20 MED ORDER — ASPIRIN 81 MG PO CHEW
81.0000 mg | CHEWABLE_TABLET | Freq: Two times a day (BID) | ORAL | Status: DC
  Administered 2024-04-20 – 2024-04-21 (×2): 81 mg via ORAL
  Filled 2024-04-20 (×2): qty 1

## 2024-04-20 MED ORDER — ISOPROPYL ALCOHOL 70 % SOLN
Status: DC | PRN
  Administered 2024-04-20: 1 via TOPICAL

## 2024-04-20 MED ORDER — METHOCARBAMOL 1000 MG/10ML IJ SOLN: 500.0000 mg | Freq: Four times a day (QID) | INTRAMUSCULAR | Status: DC | PRN

## 2024-04-20 MED ORDER — TRANEXAMIC ACID-NACL 1000-0.7 MG/100ML-% IV SOLN
1000.0000 mg | INTRAVENOUS | Status: DC
  Filled 2024-04-20: qty 100

## 2024-04-20 MED ORDER — SODIUM CHLORIDE 0.9 % IV SOLN
0.3000 ug/kg | Freq: Once | INTRAVENOUS | Status: AC
  Administered 2024-04-20: 29.2 ug via INTRAVENOUS
  Filled 2024-04-20: qty 7.3

## 2024-04-20 SURGICAL SUPPLY — 49 items
BAG COUNTER SPONGE SURGICOUNT (BAG) IMPLANT
BLADE SAG 18X100X1.27 (BLADE) ×1 IMPLANT
BLADE SAW SAG 35X64 .89 (BLADE) ×1 IMPLANT
BLADE SAW SGTL 11.0X1.19X90.0M (BLADE) IMPLANT
BNDG COHESIVE 3X5 TAN ST LF (GAUZE/BANDAGES/DRESSINGS) ×1 IMPLANT
BNDG ELASTIC 6X10 VLCR STRL LF (GAUZE/BANDAGES/DRESSINGS) ×1 IMPLANT
BOWL SMART MIX CTS (DISPOSABLE) IMPLANT
CEMENT BONE REFOBACIN R1X40 US (Cement) IMPLANT
CHLORAPREP W/TINT 26 (MISCELLANEOUS) ×2 IMPLANT
COMPONENT FEM CMT STD  SZ12LT (Joint) IMPLANT
COVER SURGICAL LIGHT HANDLE (MISCELLANEOUS) ×1 IMPLANT
CUFF TRNQT CYL 34X4.125X (TOURNIQUET CUFF) ×1 IMPLANT
DERMABOND ADVANCED .7 DNX12 (GAUZE/BANDAGES/DRESSINGS) ×2 IMPLANT
DRAPE INCISE IOBAN 85X60 (DRAPES) ×1 IMPLANT
DRAPE SHEET LG 3/4 BI-LAMINATE (DRAPES) ×1 IMPLANT
DRAPE U-SHAPE 47X51 STRL (DRAPES) ×1 IMPLANT
DRSG AQUACEL AG ADV 3.5X10 (GAUZE/BANDAGES/DRESSINGS) ×1 IMPLANT
ELECT REM PT RETURN 15FT ADLT (MISCELLANEOUS) ×1 IMPLANT
GAUZE SPONGE 4X4 12PLY STRL (GAUZE/BANDAGES/DRESSINGS) ×1 IMPLANT
GLOVE BIO SURGEON STRL SZ 6.5 (GLOVE) ×2 IMPLANT
GLOVE BIOGEL PI IND STRL 6.5 (GLOVE) ×1 IMPLANT
GLOVE BIOGEL PI IND STRL 8 (GLOVE) ×1 IMPLANT
GLOVE SURG ORTHO 8.0 STRL STRW (GLOVE) ×2 IMPLANT
GOWN STRL REUS W/ TWL XL LVL3 (GOWN DISPOSABLE) ×2 IMPLANT
HOLDER FOLEY CATH W/STRAP (MISCELLANEOUS) ×1 IMPLANT
HOOD PEEL AWAY T7 (MISCELLANEOUS) ×3 IMPLANT
KIT TURNOVER KIT A (KITS) ×1 IMPLANT
LINER TIB ASF GH/12 10 LT (Liner) IMPLANT
MANIFOLD NEPTUNE II (INSTRUMENTS) ×1 IMPLANT
MARKER SKIN DUAL TIP RULER LAB (MISCELLANEOUS) ×1 IMPLANT
NS IRRIG 1000ML POUR BTL (IV SOLUTION) ×1 IMPLANT
PACK TOTAL KNEE CUSTOM (KITS) ×1 IMPLANT
PENCIL SMOKE EVACUATOR (MISCELLANEOUS) ×1 IMPLANT
PIN DRILL HDLS TROCAR 75 4PK (PIN) IMPLANT
SCREW HEADED 33MM KNEE (MISCELLANEOUS) IMPLANT
SET HNDPC FAN SPRY TIP SCT (DISPOSABLE) ×1 IMPLANT
SOLUTION IRRIG SURGIPHOR (IV SOLUTION) IMPLANT
SOLUTION PRONTOSAN WOUND 350ML (IRRIGATION / IRRIGATOR) IMPLANT
STEM POLY PAT PLY 38M KNEE (Knees) IMPLANT
STEM TIBIA 5 DEG SZ H L KNEE (Knees) IMPLANT
STRIP CLOSURE SKIN 1/2X4 (GAUZE/BANDAGES/DRESSINGS) ×1 IMPLANT
SUT MNCRL AB 3-0 PS2 18 (SUTURE) ×1 IMPLANT
SUT STRATAFIX 14 PDO 48 VLT (SUTURE) ×1 IMPLANT
SUT VIC AB 2-0 CT2 27 (SUTURE) ×2 IMPLANT
SUTURE STRATFX 0 PDS 27 VIOLET (SUTURE) ×1 IMPLANT
TRAY FOLEY MTR SLVR 14FR STAT (SET/KITS/TRAYS/PACK) IMPLANT
TUBE SUCTION HIGH CAP CLEAR NV (SUCTIONS) ×1 IMPLANT
UNDERPAD 30X36 HEAVY ABSORB (UNDERPADS AND DIAPERS) ×1 IMPLANT
WRAP KNEE MAXI GEL POST OP (GAUZE/BANDAGES/DRESSINGS) ×1 IMPLANT

## 2024-04-20 NOTE — Op Note (Signed)
 DATE OF SURGERY:  04/20/2024 TIME: 3:18 PM  PATIENT NAME:  Brandon Schultz   AGE: 74 y.o.    PRE-OPERATIVE DIAGNOSIS: End-stage left knee osteoarthritis  POST-OPERATIVE DIAGNOSIS:  Same  PROCEDURE: Cemented left total Knee Arthroplasty  SURGEON:  Yaretsi Humphres A Devinn Hurwitz, MD   ASSISTANT: Bernarda Mclean, PA-C, present and scrubbed throughout the case, critical for assistance with exposure, retraction, instrumentation, and closure.   OPERATIVE IMPLANTS:  Zimmer persona size 12 CR femur standard, size H tibial baseplate, 38 mm three-peg cemented patella, 10 mm MC poly insert Implant Name Type Inv. Item Serial No. Manufacturer Lot No. LRB No. Used Action  CEMENT BONE REFOBACIN [id number] US  - [id number] Cement CEMENT BONE REFOBACIN [id number] US   ZIMMER RECON(ORTH,TRAU,BIO,SG) [id number] Left 3 Implanted  STEM TIBIA 5 DEG SZ H L KNEE - [id number] Knees STEM TIBIA 5 DEG SZ H L KNEE  ZIMMER RECON(ORTH,TRAU,BIO,SG) [id number] Left 1 Implanted  STEM POLY PAT PLY 29M KNEE - [id number] Knees STEM POLY PAT PLY 29M KNEE  ZIMMER RECON(ORTH,TRAU,BIO,SG) [id number] Left 1 Implanted  COMPONENT FEM CMT STD  SZ12LT - [id number] Joint COMPONENT FEM CMT STD  [id number]  ZIMMER RECON(ORTH,TRAU,BIO,SG) [id number] Left 1 Implanted  LINER TIB ASF GH/12 10 LT - [id number] Liner LINER TIB ASF GH/12 10 LT  ZIMMER RECON(ORTH,TRAU,BIO,SG) [id number] Left 1 Implanted      PREOPERATIVE INDICATIONS:  Brandon Schultz is a 74 y.o. year old male with end stage bone on bone degenerative arthritis of the knee who failed conservative treatment, including injections, antiinflammatories, activity modification, and assistive devices, and had significant impairment of their activities of daily living, and elected for Total Knee Arthroplasty.   The risks, benefits, and alternatives were discussed at length including but not limited to the risks of infection, bleeding, nerve injury, stiffness, blood clots, the need for revision surgery,  cardiopulmonary complications, among others, and they were willing to proceed.  ESTIMATED BLOOD LOSS: 50cc  OPERATIVE DESCRIPTION:   Once adequate anesthesia was induced, preoperative antibiotics, 2 gm of ancef ,1 gm of Tranexamic Acid , 29 mg of desmopressin  given history of von Willebrand's syndrome, and 8 mg of Decadron  administered, the patient was positioned supine with a left thigh tourniquet placed.  The left lower extremity was prepped and draped in sterile fashion.  A time-  out was performed identifying the patient, planned procedure, and the appropriate extremity.     The leg was  exsanguinated, tourniquet elevated to 250 mmHg.  A midline incision was  made followed by median parapatellar arthrotomy. Anterior horn of the medial meniscus was released and resected. A medial release was performed, the infrapatellar fat pad was resected with care taken to protect the patellar tendon. The suprapatellar fat was removed to exposed the distal anterior femur. The anterior horn of the lateral meniscus and ACL were released.    Following initial  exposure, I first started with the femur  The femoral  canal was opened with a drill, canal was suctioned to try to prevent fat emboli.  An  intramedullary rod was passed set at 6 degrees valgus, 10 mm. The distal femur was resected.  Following this resection, the tibia was  subluxated anteriorly.  Using the extramedullary guide, 10 mm of bone was resected off   the proximal lateral tibia.  We confirmed the gap would be  stable medially and laterally with a size 10mm spacer block as well as confirmed that the tibial cut was perpendicular in the coronal plane, checking with an alignment rod.  Once this was done, the posterior femoral referencing femoral sizer was placed under to the posterior condyles with 3 degrees of external rotational which was parallel to the transepicondylar axis and perpendicular to [employer]. The femur was sized to be a size 12 in  the anterior-  posterior dimension. The  anterior, posterior, and  chamfer cuts were made without difficulty nor   notching making certain that I was along the anterior cortex to help  with flexion gap stability. Next a laminar spreader was placed with the knee in flexion and the medial lateral menisci were resected.  5 cc of the Exparel  mixture was injected in the medial side of the back of the knee and 3 cc in the lateral side.  1/2 inch curved osteotome was used to resect posterior osteophyte that was then removed with a pituitary rongeur.       At this point, the tibia was sized to be a size H.  The size H tray was  then pinned in position. Trial reduction was now carried with a 12 femur, H tibia, a 10 mm MC insert.  The knee had full extension and was stable to varus valgus stress in extension.  The knee was slightly tight in flexion and the PCL was partially released.   Attention was next directed to the patella.  Precut  measurement was noted to be 27 mm.  I resected down to 17 mm and used a  38mm patellar button to restore patellar height as well as cover the cut surface.     The patella lug holes were drilled and a 38 mm patella poly trial was placed.    The knee was brought to full extension with good flexion stability with the patella tracking through the trochlea without application of pressure.     Next the femoral component was again assessed and determined to be seated and appropriately lateralized.  The femoral lug holes were drilled.  The femoral component was then removed. Tibial component was again assessed and felt to be seated and appropriately rotated with the medial third of the tubercle. The tibia was then drilled and we encountered the tibial screw.  Was able to see the insertion point of the tibial screw on the anterior tibial cortex.  We used a 3.5 mm hex screw and were able to successfully successfully remove the screw.  We then again drilled and keel punched the tibia.      Final components were  opened and antibiotic cement was mixed.      Final implants were then  cemented onto cleaned and dried cut surfaces of bone with the knee brought to extension with a 10 mm MC poly.  The knee was irrigated with sterile Betadine  diluted in saline as well as pulse lavage normal saline.  The synovial lining was  then injected a dilute Exparel  with 30cc of 0.25% marcaine  with epinephrine .         Once the cement had fully cured, excess cement was removed throughout the knee.  I confirmed that I was satisfied with the range of motion and stability, and the final 10 mm MC poly insert was chosen.  It was placed into the knee.         The tourniquet had been let down.  No significant hemostasis was required.  The medial parapatellar arthrotomy was then reapproximated using #1 Stratafix sutures with the knee  in flexion.  The remaining wound was closed with 0 stratafix, 2-0 Vicryl, and  running 3-0 Monocryl. The knee was cleaned, dried, dressed sterilely using Dermabond and   Aquacel dressing.  The patient was then brought to recovery room in stable condition, tolerating the procedure  well. There were no complications.   Post op recs: WB: WBAT Abx: ancef  Imaging: PACU xrays DVT prophylaxis: Aspirin  81mg  BID x4 weeks Follow up: 2 weeks after surgery for a wound check with Dr. Edna at [facility].  Address: [address], [place], [place] [address]  Office Phone: [phone]  Toribio Edna, MD Orthopaedic Surgery

## 2024-04-20 NOTE — Plan of Care (Signed)
" °  Problem: Education: Goal: Knowledge of General Education information will improve Description: Including pain rating scale, medication(s)/side effects and non-pharmacologic comfort measures 04/20/2024 1707 by Alaina Dozier PARAS, RN Outcome: Progressing 04/20/2024 1655 by Alaina Dozier PARAS, RN Outcome: Progressing   Problem: Health Behavior/Discharge Planning: Goal: Ability to manage health-related needs will improve 04/20/2024 1707 by Alaina Dozier PARAS, RN Outcome: Progressing 04/20/2024 1655 by Alaina Dozier PARAS, RN Outcome: Progressing   Problem: Clinical Measurements: Goal: Ability to maintain clinical measurements within normal limits will improve 04/20/2024 1707 by Alaina Dozier PARAS, RN Outcome: Progressing 04/20/2024 1655 by Alaina Dozier PARAS, RN Outcome: Progressing Goal: Will remain free from infection 04/20/2024 1707 by Alaina Dozier PARAS, RN Outcome: Progressing 04/20/2024 1655 by Alaina Dozier PARAS, RN Outcome: Progressing Goal: Diagnostic test results will improve 04/20/2024 1707 by Alaina Dozier PARAS, RN Outcome: Progressing 04/20/2024 1655 by Alaina Dozier PARAS, RN Outcome: Progressing   "

## 2024-04-20 NOTE — Anesthesia Procedure Notes (Addendum)
 Procedure Name: LMA Insertion Date/Time: 04/20/2024 1:15 PM  Performed by: Caelin Rayl, CRNAPre-anesthesia Checklist: Patient identified, Emergency Drugs available, Suction available and Patient being monitored Patient Re-evaluated:Patient Re-evaluated prior to induction Oxygen  Delivery Method: Circle system utilized Preoxygenation: Pre-oxygenation with 100% oxygen  Induction Type: IV induction Ventilation: Mask ventilation without difficulty LMA: LMA inserted LMA Size: 4.0 Grade View: Grade I Tube type: Oral Number of attempts: 1 Airway Equipment and Method: Stylet and Oral airway Placement Confirmation: positive ETCO2 and breath sounds checked- equal and bilateral Dental Injury: Teeth and Oropharynx as per pre-operative assessment

## 2024-04-20 NOTE — Transfer of Care (Signed)
 Immediate Anesthesia Transfer of Care Note  Patient: Brandon Schultz  Procedure(s) Performed: ARTHROPLASTY, KNEE, TOTAL (Left: Knee)  Patient Location: PACU  Anesthesia Type:GA combined with regional for post-op pain  Level of Consciousness: awake and alert   Airway & Oxygen  Therapy: Patient Spontanous Breathing and Patient connected to face mask oxygen   Post-op Assessment: Report given to RN and Post -op Vital signs reviewed and stable  Post vital signs: Reviewed and stable  Last Vitals:  Vitals Value Taken Time  BP 104/68 04/20/24 15:55  Temp    Pulse 69 04/20/24 15:55  Resp 21 04/20/24 15:55  SpO2 98 % 04/20/24 15:55  Vitals shown include unfiled device data.  Last Pain:  Vitals:   04/20/24 1155  TempSrc:   PainSc: 0-No pain         Complications: No notable events documented.

## 2024-04-20 NOTE — Progress Notes (Signed)
 Orthopedic Tech Progress Note Patient Details:  Brandon Schultz May 27, 1950 [id number] Applied bone foam per order.  Ortho Devices Type of Ortho Device: Bone foam zero knee Ortho Device/Splint Location: LLE Ortho Device/Splint Interventions: Ordered, Application, Adjustment   Post Interventions Patient Tolerated: Well Instructions Provided: Adjustment of device, Care of device, Poper ambulation with device  Morna Pink 04/20/2024, 5:09 PM

## 2024-04-20 NOTE — Plan of Care (Signed)

## 2024-04-20 NOTE — Interval H&P Note (Signed)

## 2024-04-20 NOTE — Discharge Instructions (Signed)

## 2024-04-20 NOTE — Anesthesia Procedure Notes (Signed)
 Anesthesia Regional Block: Adductor canal block   Pre-Anesthetic Checklist: , timeout performed,  Correct Patient, Correct Site, Correct Laterality,  Correct Procedure, Correct Position, site marked,  Risks and benefits discussed,  Surgical consent,  Pre-op  evaluation,  At surgeon's request and post-op pain management  Laterality: Left  Prep: chloraprep       Needles:  Injection technique: Single-shot  Needle Type: Echogenic Stimulator Needle     Needle Length: 9cm  Needle Gauge: 21     Additional Needles:   Procedures:,,,, ultrasound used (permanent image in chart),,    Narrative:  Start time: 04/20/2024 11:41 AM End time: 04/20/2024 11:45 AM Injection made incrementally with aspirations every 5 mL.  Performed by: Personally  Anesthesiologist: Peggye Delon Brunswick, MD  Additional Notes: Discussed risks and benefits of nerve block including, but not limited to, prolonged and/or permanent nerve injury involving sensory and/or motor function. Monitors were applied and a time-out was performed. The nerve and associated structures were visualized under ultrasound guidance. After negative aspiration, local anesthetic was slowly injected around the nerve. There was no evidence of high pressure during the procedure. There were no paresthesias. VSS remained stable and the patient tolerated the procedure well.

## 2024-04-20 NOTE — Anesthesia Preprocedure Evaluation (Addendum)
 "                                  Anesthesia Evaluation  Patient identified by MRN, date of birth, ID band Patient awake    Reviewed: Allergy & Precautions, NPO status , Patient's Chart, lab work & pertinent test results  History of Anesthesia Complications (+) PONV and history of anesthetic complications  Airway Mallampati: I  TM Distance: >3 FB Neck ROM: Full   Comment: Previous grade I view with MAC 4, 2-handed mask with OPA Dental  (+) Dental Advisory Given, Partial Lower   Pulmonary neg shortness of breath, asthma , neg sleep apnea, COPD,  COPD inhaler, neg recent URI, former smoker H/o lung cancer s/p RUL lobectomy   Pulmonary exam normal breath sounds clear to auscultation       Cardiovascular (-) angina + CAD  (-) Past MI, (-) Cardiac Stents and (-) CABG (-) dysrhythmias  Rhythm:Regular Rate:Normal  HLD  TTE 05/22/2021: IMPRESSIONS    1. Left ventricular ejection fraction, by estimation, is 60 to 65%. The  left ventricle has normal function. The left ventricle has no regional  wall motion abnormalities. Left ventricular diastolic parameters were  normal. The average left ventricular  global longitudinal strain is -21.3 %.   2. Right ventricular systolic function is normal. The right ventricular  size is normal. Tricuspid regurgitation signal is inadequate for assessing  PA pressure.   3. The mitral valve is normal in structure. No evidence of mitral valve  regurgitation. No evidence of mitral stenosis.   4. The aortic valve is tricuspid. Aortic valve regurgitation is not  visualized. No aortic stenosis is present.   5. Aortic dilatation noted. There is mild dilatation of the aortic root,  measuring 40 mm.   6. The inferior vena cava is normal in size with <50% respiratory  variability, suggesting right atrial pressure of 8 mmHg.   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     Musculoskeletal  (+) Arthritis , Osteoarthritis,    Abdominal   Peds  Hematology  (+) Blood dyscrasia (von Willebrand disease) Lab Results      Component                Value               Date                      WBC                      9.0                 04/07/2024                HGB                      14.8                04/07/2024                HCT  44.5                04/07/2024                MCV                      91.0                04/07/2024                PLT                      276                 04/07/2024              Anesthesia Other Findings   Reproductive/Obstetrics                              Anesthesia Physical Anesthesia Plan  ASA: 3  Anesthesia Plan: General   Post-op Pain Management: Regional block* and Tylenol  PO (pre-op )*   Induction: Intravenous  PONV Risk Score and Plan: 3 and Ondansetron , Dexamethasone , Treatment may vary due to age or medical condition, Propofol  infusion and TIVA  Airway Management Planned: LMA  Additional Equipment:   Intra-op Plan:   Post-operative Plan:   Informed Consent: I have reviewed the patients History and Physical, chart, labs and discussed the procedure including the risks, benefits and alternatives for the proposed anesthesia with the patient or authorized representative who has indicated his/her understanding and acceptance.     Dental advisory given  Plan Discussed with: CRNA and Anesthesiologist  Anesthesia Plan Comments: (The patient stated he is not followed by anyone for his vWD. I called the Heme-Onc doctor on call regarding the patient's vWD. He had me get in touch with the patient's Oncology doctor, Dr. Sherrod. Per previous notes, Dr.  Sherrod made the recommendations for his RUL lobectomy in 02/2023. Dr. Sherrod stated that he does not manage the patient's vWD but since he did well with the TXA and DDAVP  for his RUL lobectomy, he would recommend doing the same for his knee replacement. Discussed with patient and Dr. Edna.   Discussed potential risks of nerve blocks including, but not limited to, infection, bleeding, nerve damage, seizures, respiratory depression, and potential failure of the block. Alternatives to nerve blocks discussed. All questions answered.  Risks of general anesthesia discussed including, but not limited to, sore throat, hoarse voice, chipped/damaged teeth, injury to vocal cords, nausea and vomiting, allergic reactions, lung infection, heart attack, stroke, and death. All questions answered. )         Anesthesia Quick Evaluation  "

## 2024-04-21 ENCOUNTER — Encounter (HOSPITAL_COMMUNITY): Payer: Self-pay | Admitting: Orthopedic Surgery

## 2024-04-21 DIAGNOSIS — M1712 Unilateral primary osteoarthritis, left knee: Secondary | ICD-10-CM | POA: Diagnosis not present

## 2024-04-21 LAB — BASIC METABOLIC PANEL WITH GFR
Anion gap: 13 (ref 5–15)
BUN: 14 mg/dL (ref 8–23)
CO2: 20 mmol/L — ABNORMAL LOW (ref 22–32)
Calcium: 9 mg/dL (ref 8.9–10.3)
Chloride: 103 mmol/L (ref 98–111)
Creatinine, Ser: 0.95 mg/dL (ref 0.61–1.24)
GFR, Estimated: >60 mL/min
Glucose, Bld: 127 mg/dL — ABNORMAL HIGH (ref 70–99)
Potassium: 3.7 mmol/L (ref 3.5–5.1)
Sodium: 137 mmol/L (ref 135–145)

## 2024-04-21 LAB — CBC
HCT: 37.7 % — ABNORMAL LOW (ref 39.0–52.0)
Hemoglobin: 13.2 g/dL (ref 13.0–17.0)
MCH: 30.8 pg (ref 26.0–34.0)
MCHC: 35 g/dL (ref 30.0–36.0)
MCV: 88.1 fL (ref 80.0–100.0)
Platelets: 238 K/uL (ref 150–400)
RBC: 4.28 MIL/uL (ref 4.22–5.81)
RDW: 13.2 % (ref 11.5–15.5)
WBC: 16.8 K/uL — ABNORMAL HIGH (ref 4.0–10.5)
nRBC: 0 % (ref 0.0–0.2)

## 2024-04-21 NOTE — Discharge Summary (Signed)
 Physician Discharge Summary  Patient ID: Brandon Schultz MRN: [id number] DOB/AGE: [birth year] 74 y.o.  Admit date: 04/20/2024 Discharge date: 04/21/2024  Admission Diagnoses:  Localized osteoarthritis of left knee  Discharge Diagnoses:  Principal Problem:   Localized osteoarthritis of left knee   Past Medical History:  Diagnosis Date   Allergy    seasonal   Arthritis    Asthma    Cervical stenosis of spine    Colon polyps    Complication of anesthesia    COPD (chronic obstructive pulmonary disease) (HCC)    Coronary artery disease    Diverticulitis    Dyspnea    Emphysema    GERD (gastroesophageal reflux disease)    Glaucoma    Gum disease    H/O degenerative disc disease    Hemorrhoids    HOH (hard of hearing)    Inguinal hernia    Melanoma (HCC)    facial   PONV (postoperative nausea and vomiting)    Renal disease    Bright's Disease-childhood   Syncope    Umbilical hernia    Urinary tract infection    Von Willebrand disease (HCC)     Surgeries: Procedures: ARTHROPLASTY, KNEE, TOTAL on 04/20/2024   Consultants (if any):   Discharged Condition: Improved  Hospital Course: Brandon Schultz is an 74 y.o. male who was admitted 04/20/2024 with a diagnosis of Localized osteoarthritis of left knee and went to the operating room on 04/20/2024 and underwent the above named procedures.    He was given perioperative antibiotics:  Anti-infectives (From admission, onward)    Start     Dose/Rate Route Frequency Ordered Stop   04/20/24 1930  ceFAZolin  (ANCEF ) IVPB 2g/100 mL premix        2 g 200 mL/hr over 30 Minutes Intravenous Every 6 hours 04/20/24 1655 04/21/24 0122   04/20/24 1030  ceFAZolin  (ANCEF ) IVPB 2g/100 mL premix        2 g 200 mL/hr over 30 Minutes Intravenous On call to O.R. 04/20/24 1026 04/20/24 1401     .  He was given sequential compression devices, early ambulation, and aspirin  for DVT prophylaxis.  He benefited maximally from the hospital stay  and there were no complications.    Recent vital signs:  Vitals:   04/21/24 0032 04/21/24 0409  BP: 119/77 109/76  Pulse: 93 79  Resp: 17 18  Temp: 98.2 F (36.8 C) (!) 97.5 F (36.4 C)  SpO2: 95% 97%    Recent laboratory studies:  Lab Results  Component Value Date   HGB 13.2 04/21/2024   HGB 14.8 04/07/2024   HGB 14.4 03/17/2024   Lab Results  Component Value Date   WBC 16.8 (H) 04/21/2024   PLT 238 04/21/2024   Lab Results  Component Value Date   INR 1.1 03/05/2023   Lab Results  Component Value Date   NA 137 04/21/2024   K 3.7 04/21/2024   CL 103 04/21/2024   CO2 20 (L) 04/21/2024   BUN 14 04/21/2024   CREATININE 0.95 04/21/2024   GLUCOSE 127 (H) 04/21/2024    Discharge Medications:   Allergies as of 04/21/2024   No Known Allergies      Medication List     TAKE these medications    acetaminophen  500 MG tablet Commonly known as: TYLENOL  Take 2 tablets (1,000 mg total) by mouth every 8 (eight) hours as needed.   aspirin  EC 81 MG tablet Take 1 tablet (81 mg total) by mouth 2 (two)  times daily for 28 days. Swallow whole.   celecoxib  100 MG capsule Commonly known as: CeleBREX  Take 1 capsule (100 mg total) by mouth 2 (two) times daily for 14 days.   cyanocobalamin 500 MCG tablet Commonly known as: VITAMIN B12 Take 500 mcg by mouth daily.   methocarbamol  500 MG tablet Commonly known as: ROBAXIN  Take 1 tablet (500 mg total) by mouth every 8 (eight) hours as needed for up to 10 days for muscle spasms.   ondansetron  4 MG tablet Commonly known as: Zofran  Take 1 tablet (4 mg total) by mouth every 8 (eight) hours as needed for up to 14 days for nausea or vomiting.   oxyCODONE  5 MG immediate release tablet Commonly known as: Roxicodone  Take 1 tablet (5 mg total) by mouth every 4 (four) hours as needed for up to 7 days for severe pain (pain score 7-10) or moderate pain (pain score 4-6).   pantoprazole  40 MG tablet Commonly known as: PROTONIX  Take 1  tablet (40 mg total) by mouth 2 (two) times daily.   polyethylene glycol 17 g packet Commonly known as: MIRALAX  / GLYCOLAX  Take 17 g by mouth daily.   rosuvastatin  20 MG tablet Commonly known as: CRESTOR  Take 1 tablet (20 mg total) by mouth daily.   tamsulosin  0.4 MG Caps capsule Commonly known as: FLOMAX  Take 1 capsule (0.4 mg total) by mouth daily.   timolol  0.5 % ophthalmic solution Commonly known as: TIMOPTIC  Place 1 drop into the left eye every morning.        Diagnostic Studies: DG Knee Left Port Result Date: 04/20/2024 EXAM: 1 or 2 VIEW(S) XRAY OF THE LEFT KNEE 04/20/2024 04:13:00 PM COMPARISON: None available. CLINICAL HISTORY: Post-operative state FINDINGS: BONES AND JOINTS: Left total knee arthroplasty in place. No evidence of fracture or malalignment. SOFT TISSUES: Expected postoperative soft tissue gas. IMPRESSION: 1. Left total knee arthroplasty in place with expected postoperative soft tissue gas. Electronically signed by: Greig Pique MD 04/20/2024 10:16 PM EST RP Workstation: [id number]    Disposition: Discharge disposition: 01-Home or Self Care       Discharge Instructions     Call MD / Call 911   Complete by: As directed    If you experience chest pain or shortness of breath, CALL 911 and be transported to the hospital emergency room.  If you develope a fever above 101 F, pus (white drainage) or increased drainage or redness at the wound, or calf pain, call your surgeon's office.   Constipation Prevention   Complete by: As directed    Drink plenty of fluids.  Prune juice may be helpful.  You may use a stool softener, such as Colace (over the counter) 100 mg twice a day.  Use MiraLax  (over the counter) for constipation as needed.   Increase activity slowly as tolerated   Complete by: As directed    Post-operative opioid taper instructions:   Complete by: As directed    POST-OPERATIVE OPIOID TAPER INSTRUCTIONS: It is important to wean off of your opioid  medication as soon as possible. If you do not need pain medication after your surgery it is ok to stop day one. Opioids include: Codeine, Hydrocodone (Norco, Vicodin), Oxycodone (Percocet, oxycontin ) and hydromorphone  amongst others.  Long term and even short term use of opiods can cause: Increased pain response Dependence Constipation Depression Respiratory depression And more.  Withdrawal symptoms can include Flu like symptoms Nausea, vomiting And more Techniques to manage these symptoms Hydrate well Eat regular healthy meals Stay active  Use relaxation techniques(deep breathing, meditating, yoga) Do Not substitute Alcohol  to help with tapering If you have been on opioids for less than two weeks and do not have pain than it is ok to stop all together.  Plan to wean off of opioids This plan should start within one week post op of your joint replacement. Maintain the same interval or time between taking each dose and first decrease the dose.  Cut the total daily intake of opioids by one tablet each day Next start to increase the time between doses. The last dose that should be eliminated is the evening dose.           Follow-up Information     Edna Toribio LABOR, MD. Go on 05/05/2024.   Specialty: Orthopedic Surgery Why: your appointment is scheduled for 2:30 Contact information: [address] [place] [address] [phone]         [facility], [place]. Go on 04/23/2024.   Why: Your outpatient physical therapy is scheduled and they will call you with a time Contact information: [facility] [address] [place] [address] [phone]                    Discharge Instructions      INSTRUCTIONS AFTER JOINT REPLACEMENT   Remove items at home which could result in a fall. This includes throw rugs or furniture in walking pathways ICE to the affected joint every three hours while awake for 30  minutes at a time, for at least the first 3-5 days, and then as needed for pain and swelling.  Continue to use ice for pain and swelling. You may notice swelling that will progress down to the foot and ankle.  This is normal after surgery.  Elevate your leg when you are not up walking on it.   Continue to use the breathing machine you got in the hospital (incentive spirometer) which will help keep your temperature down.  It is common for your temperature to cycle up and down following surgery, especially at night when you are not up moving around and exerting yourself.  The breathing machine keeps your lungs expanded and your temperature down.  DIET:  As you were doing prior to hospitalization, we recommend a well-balanced diet.  DRESSING / WOUND CARE / SHOWERING:  Keep the surgical dressing until follow up.  The dressing is water  proof, so you can shower without any extra covering.  IF THE DRESSING FALLS OFF or the wound gets wet inside, change the dressing with sterile gauze.  Please use good hand washing techniques before changing the dressing.  Do not use any lotions or creams on the incision until instructed by your surgeon.    ACTIVITY  Increase activity slowly as tolerated, but follow the weight bearing instructions below.   No driving for 6 weeks or until further direction given by your physician.  You cannot drive while taking narcotics.  No lifting or carrying greater than 10 lbs. until further directed by your surgeon. Avoid periods of inactivity such as sitting longer than an hour when not asleep. This helps prevent blood clots.  You may return to work once you are authorized by your doctor.   WEIGHT BEARING: Weight bearing as tolerated with assist device (walker, cane, etc) as directed, use it as long as suggested by your surgeon or therapist, typically at least 4-6 weeks.  EXERCISES  Results after joint replacement surgery are often greatly improved  when you follow the exercise,  range of motion and muscle strengthening exercises prescribed by your doctor. Safety measures are also important to protect the joint from further injury. Any time any of these exercises cause you to have increased pain or swelling, decrease what you are doing until you are comfortable again and then slowly increase them. If you have problems or questions, call your caregiver or physical therapist for advice.   Rehabilitation is important following a joint replacement. After just a few days of immobilization, the muscles of the leg can become weakened and shrink (atrophy).  These exercises are designed to build up the tone and strength of the thigh and leg muscles and to improve motion. Often times heat used for twenty to thirty minutes before working out will loosen up your tissues and help with improving the range of motion but do not use heat for the first two weeks following surgery (sometimes heat can increase post-operative swelling).   These exercises can be done on a training (exercise) mat, on the floor, on a table or on a bed. Use whatever works the best and is most comfortable for you.    Use music or television while you are exercising so that the exercises are a pleasant break in your day. This will make your life better with the exercises acting as a break in your routine that you can look forward to.   Perform all exercises about fifteen times, three times per day or as directed.  You should exercise both the operative leg and the other leg as well.  Exercises include:   Quad Sets - Tighten up the muscle on the front of the thigh (Quad) and hold for 5-10 seconds.   Straight Leg Raises - With your knee straight (if you were given a brace, keep it on), lift the leg to 60 degrees, hold for 3 seconds, and slowly lower the leg.  Perform this exercise against resistance later as your leg gets stronger.  Leg Slides: Lying on your back, slowly slide your foot toward your buttocks, bending your knee  up off the floor (only go as far as is comfortable). Then slowly slide your foot back down until your leg is flat on the floor again.  Angel Wings: Lying on your back spread your legs to the side as far apart as you can without causing discomfort.  Hamstring Strength:  Lying on your back, push your heel against the floor with your leg straight by tightening up the muscles of your buttocks.  Repeat, but this time bend your knee to a comfortable angle, and push your heel against the floor.  You may put a pillow under the heel to make it more comfortable if necessary.   A rehabilitation program following joint replacement surgery can speed recovery and prevent re-injury in the future due to weakened muscles. Contact your doctor or a physical therapist for more information on knee rehabilitation.   CONSTIPATION:  Constipation is defined medically as fewer than three stools per week and severe constipation as less than one stool per week.  Even if you have a regular bowel pattern at home, your normal regimen is likely to be disrupted due to multiple reasons following surgery.  Combination of anesthesia, postoperative narcotics, change in appetite and fluid intake all can affect your bowels.   YOU MUST use at least one of the following options; they are listed in order of increasing strength to get the job done.  They are all available  over the counter, and you may need to use some, POSSIBLY even all of these options:    Drink plenty of fluids (prune juice may be helpful) and high fiber foods Colace 100 mg by mouth twice a day  Senokot for constipation as directed and as needed Dulcolax (bisacodyl ), take with full glass of water   Miralax  (polyethylene glycol) once or twice a day as needed.  If you have tried all these things and are unable to have a bowel movement in the first 3-4 days after surgery call either your surgeon or your primary doctor.    If you experience loose stools or diarrhea, hold the  medications until you stool forms back up.  If your symptoms do not get better within 1 week or if they get worse, check with your doctor.  If you experience the worst abdominal pain ever or develop nausea or vomiting, please contact the office immediately for further recommendations for treatment.  ITCHING:  If you experience itching with your medications, try taking only a single pain pill, or even half a pain pill at a time.  You can also use Benadryl  over the counter for itching or also to help with sleep.   TED HOSE STOCKINGS:  Use stockings on both legs until for at least 2 weeks or as directed by physician office. They may be removed at night for sleeping.  MEDICATIONS:  See your medication summary on the After Visit Summary that nursing will review with you.  You may have some home medications which will be placed on hold until you complete the course of blood thinner medication.  It is important for you to complete the blood thinner medication as prescribed.  Blood clot prevention (DVT Prophylaxis): After surgery you are at an increased risk for a blood clot.  You were prescribed a blood thinner, Aspirin  81mg , to be taken twice daily for a total of 4 weeks from surgery to help reduce your risk of getting a blood clot.  Signs of a pulmonary embolus (blood clot in the lungs) include sudden short of breath, feeling lightheaded or dizzy, chest pain with a deep breath, rapid pulse rapid breathing.  Signs of a blood clot in your arms or legs include new unexplained swelling and cramping, warm, red or darkened skin around the painful area.  Please call the office or 911 right away if these signs or symptoms develop.  PRECAUTIONS:   If you experience chest pain or shortness of breath - call 911 immediately for transfer to the hospital emergency department.   If you develop a fever greater that 101 F, purulent drainage from wound, increased redness or drainage from wound, foul odor from the  wound/dressing, or calf pain - CONTACT YOUR SURGEON.                                                   FOLLOW-UP APPOINTMENTS:  If you do not already have a post-op appointment, please call the office for an appointment to be seen by your surgeon.  Guidelines for how soon to be seen are listed in your After Visit Summary, but are typically between 2-3 weeks after surgery.  If you have a specialized bandage, you may be told to follow up 1 week after surgery.  OTHER INSTRUCTIONS:  Knee Replacement:  Do not place pillow under knee, focus  on keeping the knee straight while resting.  Place foam block, curve side up under heel at all times except when walking.  DO NOT modify, tear, cut, or change the foam block in any way.  POST-OPERATIVE OPIOID TAPER INSTRUCTIONS: It is important to wean off of your opioid medication as soon as possible. If you do not need pain medication after your surgery it is ok to stop day one. Opioids include: Codeine, Hydrocodone (Norco, Vicodin), Oxycodone (Percocet, oxycontin ) and hydromorphone  amongst others.  Long term and even short term use of opiods can cause: Increased pain response Dependence Constipation Depression Respiratory depression And more.  Withdrawal symptoms can include Flu like symptoms Nausea, vomiting And more Techniques to manage these symptoms Hydrate well Eat regular healthy meals Stay active Use relaxation techniques(deep breathing, meditating, yoga) Do Not substitute Alcohol  to help with tapering If you have been on opioids for less than two weeks and do not have pain than it is ok to stop all together.  Plan to wean off of opioids This plan should start within one week post op of your joint replacement. Maintain the same interval or time between taking each dose and first decrease the dose.  Cut the total daily intake of opioids by one tablet each day Next start to increase the time between doses. The last dose that should be eliminated  is the evening dose.   MAKE SURE YOU:  Understand these instructions.  Get help right away if you are not doing well or get worse.    Thank you for letting us  be a part of your medical care team.  It is a privilege we respect greatly.  We hope these instructions will help you stay on track for a fast and full recovery!            Signed: Joia Doyle A Bart Ashford 04/21/2024, 6:35 AM

## 2024-04-21 NOTE — Progress Notes (Signed)
 Provided discharge education/instructions, all questions answered. Pt is alert and oriented x 4, not in any distress. Pt discharged home with all of his belongings accompanied by his wife.

## 2024-04-21 NOTE — TOC Transition Note (Signed)
 Transition of Care [facility]) - Discharge Note   Patient Details  Name: Brandon Schultz MRN: [id number] Date of Birth: 05-22-1950  Transition of [facility]) CM/SW Contact:  Alfonse JONELLE Rex, RN Phone Number: 04/21/2024, 9:54 AM   Clinical Narrative:   Met with patient at bedside to review dc therapy and home equipment needs, pt confirmed [address], [place] delivered to bedside by [employer].     Final next level of care: OP Rehab Barriers to Discharge: No Barriers Identified   Patient Goals and CMS Choice Patient states their goals for this hospitalization and ongoing recovery are:: return home          Discharge Placement                       Discharge Plan and Services Additional resources added to the After Visit Summary for                  DME Arranged: Walker rolling DME Agency: [employer]                  Social Drivers of Health (SDOH) Interventions SDOH Screenings   Food Insecurity: No Food Insecurity (04/20/2024)  Housing: Low Risk (04/20/2024)  Transportation Needs: No Transportation Needs (04/20/2024)  Utilities: Not At Risk (04/20/2024)  Alcohol  Screen: Low Risk (12/18/2023)  Depression (PHQ2-9): Low Risk (02/17/2024)  Financial Resource Strain: Low Risk (12/18/2023)  Physical Activity: Insufficiently Active (12/18/2023)  Social Connections: Socially Isolated (04/20/2024)  Stress: No Stress Concern Present (12/18/2023)  Tobacco Use: Medium Risk (04/21/2024)  Health Literacy: Adequate Health Literacy (12/19/2023)     Readmission Risk Interventions     No data to display

## 2024-04-21 NOTE — Progress Notes (Signed)
" ° ° ° °  Subjective:  Patient reports pain as mild.  He has been up with nursing.  Doing well overall.  Close plan for mobilization with therapy today.  Anticipate discharge home this afternoon after physical therapy.  Yesterday's total administered Morphine  Milligram Equivalents: 107.5   Objective:   VITALS:   Vitals:   04/20/24 1731 04/20/24 1944 04/21/24 0032 04/21/24 0409  BP:  130/78 119/77 109/76  Pulse:  86 93 79  Resp:  18 17 18   Temp:  (!) 97.4 F (36.3 C) 98.2 F (36.8 C) (!) 97.5 F (36.4 C)  TempSrc:  Oral Oral Oral  SpO2:  98% 95% 97%  Weight: 97 kg     Height: 5' 10 (1.778 m)       Sensation intact distally Intact pulses distally Dorsiflexion/Plantar flexion intact Incision: dressing C/D/I Compartment soft    Lab Results  Component Value Date   WBC 16.8 (H) 04/21/2024   HGB 13.2 04/21/2024   HCT 37.7 (L) 04/21/2024   MCV 88.1 04/21/2024   PLT 238 04/21/2024   BMET    Component Value Date/Time   NA 137 04/21/2024 0353   NA 139 02/17/2024 1039   K 3.7 04/21/2024 0353   CL 103 04/21/2024 0353   CO2 20 (L) 04/21/2024 0353   GLUCOSE 127 (H) 04/21/2024 0353   BUN 14 04/21/2024 0353   BUN 19 02/17/2024 1039   CREATININE 0.95 04/21/2024 0353   CREATININE 0.91 03/17/2024 1057   CREATININE 0.92 02/18/2013 1431   CALCIUM  9.0 04/21/2024 0353   EGFR 91 02/17/2024 1039   GFRNONAA >60 04/21/2024 0353   GFRNONAA >60 03/17/2024 1057      Xray: Total knee arthroplasty components in good position no adverse features  Assessment/Plan: 1 Day Post-Op   Principal Problem:   Localized osteoarthritis of left knee  S/p L TKA 04/21/23  Post op recs: WB: WBAT Abx: ancef  Imaging: PACU xrays DVT prophylaxis: Aspirin  81mg  BID x4 weeks Follow up: 2 weeks after surgery for a wound check with Dr. Edna at [facility].  Address: [address], [place], [place] [address]  Office Phone: [phone]      TORIBIO DELENA EDNA 04/21/2024, 6:34 AM   Toribio Edna, MD  Contact information:   [phone] 7am-5pm epic message Dr. Edna, or call office for patient follow up: [phone] After hours and holidays please check [email or web address] for group call information for [facility]   "

## 2024-04-21 NOTE — Evaluation (Signed)
 Physical Therapy Evaluation Patient Details Name: Brandon Schultz MRN: [id number] DOB: 1950-10-30 Today's Date: 04/21/2024  History of Present Illness  Brandon Schultz is a 74 yo male s/p L TKA 04/20/24. PMH: COPD, GERD, glaucoma, melanoma, Von Willebrand disease  Clinical Impression  Pt is s/p L TKA resulting in the deficits listed below (see PT Problem List). PTA, pt ind without AD for community ambulation with pain in knee, reports ind with ADLs/IADLs, no DME at home, lives in single level home with spouse. On eval, pt with good sensation, though does report peripheral neuropathy at baseline that feels symmetrical on eval. Pt with good quad set, able to perform SLR without extensor lag. Provided written/illustrated HEP and pt ale to perform exercises correctly, spouse present, all questions answered. Pt needing supv with bed mobility, slightly impulsive/quick but also using momentum to power up. Educated on hand placement and RW use. Pt completes transfers and amb 70 ft with RW, CGA, step-to gait pattern without knee buckling noted, decreased LLE stance and weightbearing time. Plan to return for 2nd session to complete single step stair training to safely enter/exit home. Pt reports plan to d/c home with spouse and start OPPT 04/23/24; RW delivered to room during eval. Pt will benefit from acute skilled PT to increase their independence and safety with mobility to allow discharge.           If plan is discharge home, recommend the following: A little help with walking and/or transfers;A little help with bathing/dressing/bathroom;Assistance with cooking/housework;Assist for transportation;Help with stairs or ramp for entrance   Can travel by private vehicle        Equipment Recommendations Rolling walker (2 wheels) (delivered to room on eval)  Recommendations for Other Services       Functional Status Assessment Patient has had a recent decline in their functional status and demonstrates the  ability to make significant improvements in function in a reasonable and predictable amount of time.     Precautions / Restrictions Precautions Precautions: Fall;Knee Restrictions Weight Bearing Restrictions Per Provider Order: No LLE Weight Bearing Per Provider Order: Weight bearing as tolerated      Mobility  Bed Mobility Overal bed mobility: Needs Assistance Bed Mobility: Supine to Sit     Supine to sit: Supervision, Used rails     General bed mobility comments: supv, powers to sit up quickly with momentum, uses bedrail    Transfers Overall transfer level: Needs assistance Equipment used: Rolling walker (2 wheels) Transfers: Sit to/from Stand Sit to Stand: Contact guard assist           General transfer comment: CGA to power to stand, BUE assisting to power up    Ambulation/Gait Ambulation/Gait assistance: Contact guard assist Gait Distance (Feet): 70 Feet Assistive device: Rolling walker (2 wheels) Gait Pattern/deviations: Step-to pattern, Decreased stance time - left, Decreased weight shift to left Gait velocity: decreased     General Gait Details: step-to gait pattern with decreased LLE stance time and weightbearing, no knee buckling noted, decreased cadence, increased steps/time with turns  Careers Information Officer     Tilt Bed    Modified Rankin (Stroke Patients Only)       Balance Overall balance assessment: Needs assistance Sitting-balance support: Feet supported Sitting balance-Leahy Scale: Good     Standing balance support: During functional activity, Bilateral upper extremity supported, Reliant on assistive device for balance Standing balance-Leahy Scale: Poor  Pertinent Vitals/Pain Pain Assessment Pain Assessment: Faces Faces Pain Scale: Hurts even more Pain Location: L knee Pain Descriptors / Indicators: Grimacing, Discomfort Pain Intervention(s): Limited activity  within patient's tolerance, Monitored during session, Premedicated before session, Repositioned, Ice applied    Home Living Family/patient expects to be discharged to:: Private residence Living Arrangements: Spouse/significant other Available Help at Discharge: Family;Available 24 hours/day Type of Home: House Home Access: Stairs to enter Entrance Stairs-Rails: Right Entrance Stairs-Number of Steps: 1   Home Layout: One level Home Equipment: None;Shower seat Additional Comments: OPPT scheduled to start 04/23/24    Prior Function Prior Level of Function : Independent/Modified Independent;Driving             Mobility Comments: pt reports ind without AD ADLs Comments: pt reports ind with ADLs/IADLs     Extremity/Trunk Assessment   Upper Extremity Assessment Upper Extremity Assessment: Overall WFL for tasks assessed    Lower Extremity Assessment Lower Extremity Assessment: LLE deficits/detail LLE Deficits / Details: ankle AROM WFL, knee able to perform quad set, good SLR without extensor lag, heel slide ~90 deg while seated at bedside LLE Sensation: WNL LLE Coordination: WNL    Cervical / Trunk Assessment Cervical / Trunk Assessment: Normal  Communication   Communication Communication: Impaired Factors Affecting Communication: Hearing impaired (left hearing aides at home, reads lips)    Cognition Arousal: Alert Behavior During Therapy: WFL for tasks assessed/performed   PT - Cognitive impairments: No apparent impairments                         Following commands: Intact       Cueing Cueing Techniques: Verbal cues     General Comments      Exercises Total Joint Exercises Ankle Circles/Pumps: AROM, Both, 5 reps, Supine Quad Sets: AROM, Left, Supine (3 reps) Short Arc Quad: AROM, Left, Supine (3 reps) Heel Slides: AROM, Left, Supine, Seated (3 reps) Hip ABduction/ADduction: AROM, Left, Supine (3 reps) Straight Leg Raises: AROM, Left, Supine (3  reps) Long Arc Quad: AROM, Left, Seated (3 reps)   Assessment/Plan    PT Assessment Patient needs continued PT services  PT Problem List Decreased strength;Decreased range of motion;Decreased balance;Decreased knowledge of use of DME;Decreased safety awareness;Impaired sensation;Pain       PT Treatment Interventions DME instruction;Gait training;Stair training;Functional mobility training;Therapeutic activities;Therapeutic exercise;Balance training;Patient/family education    PT Goals (Current goals can be found in the Care Plan section)  Acute Rehab PT Goals Patient Stated Goal: regain independence PT Goal Formulation: With patient Time For Goal Achievement: 05/05/24 Potential to Achieve Goals: Good    Frequency 7X/week     Co-evaluation               AM-PAC PT 6 Clicks Mobility  Outcome Measure Help needed turning from your back to your side while in a flat bed without using bedrails?: A Little Help needed moving from lying on your back to sitting on the side of a flat bed without using bedrails?: A Little Help needed moving to and from a bed to a chair (including a wheelchair)?: A Little Help needed standing up from a chair using your arms (e.g., wheelchair or bedside chair)?: A Little Help needed to walk in hospital room?: A Little Help needed climbing 3-5 steps with a railing? : A Lot 6 Click Score: 17    End of Session Equipment Utilized During Treatment: Gait belt Activity Tolerance: Patient tolerated treatment well Patient left: in chair;with  call bell/phone within reach;with chair alarm set;with family/visitor present Nurse Communication: Mobility status PT Visit Diagnosis: Muscle weakness (generalized) (M62.81);Unsteadiness on feet (R26.81);Pain Pain - Right/Left: Left Pain - part of body: Knee    Time: 0920-1015 PT Time Calculation (min) (ACUTE ONLY): 55 min   Charges:   PT Evaluation $PT Eval Low Complexity: 1 Low PT Treatments $Gait Training:  8-22 mins $Therapeutic Exercise: 8-22 mins PT General Charges $$ ACUTE PT VISIT: 1 Visit         Tori Kennan Detter PT, DPT 04/21/2024, 11:14 AM

## 2024-04-21 NOTE — Plan of Care (Signed)

## 2024-04-21 NOTE — Progress Notes (Signed)
 Physical Therapy Treatment Patient Details Name: Brandon Schultz MRN: [id number] DOB: 1950-08-20 Today's Date: 04/21/2024   History of Present Illness Brandon Schultz is a 74 yo male s/p L TKA 04/20/24. PMH: COPD, GERD, glaucoma, melanoma, Von Willebrand disease    PT Comments  Pt up in recliner, reports pain tolerable. Pt completes single step stair training with RW, educated on sequencing, spouse present for stair training. Pt ascends/descends single curb steps twice, supv for safety, good steadiness. All questions answered. Pt reports ready to d/c home with spouse support; notified RN.    If plan is discharge home, recommend the following: A little help with walking and/or transfers;A little help with bathing/dressing/bathroom;Assistance with cooking/housework;Assist for transportation;Help with stairs or ramp for entrance   Can travel by private vehicle        Equipment Recommendations  Rolling walker (2 wheels) (delivered to room on eval)    Recommendations for Other Services       Precautions / Restrictions Precautions Precautions: Fall;Knee Restrictions Weight Bearing Restrictions Per Provider Order: No LLE Weight Bearing Per Provider Order: Weight bearing as tolerated     Mobility  Bed Mobility      General bed mobility comments: in recliner upon arrival    Transfers Overall transfer level: Needs assistance Equipment used: Rolling walker (2 wheels) Transfers: Sit to/from Stand Sit to Stand: Supervision           General transfer comment: BUE assisting to power to stand, supv, good steadiness    Ambulation/Gait Ambulation/Gait assistance: Supervision Gait Distance (Feet): 80 Feet Assistive device: Rolling walker (2 wheels) Gait Pattern/deviations: Step-to pattern, Step-through pattern, Decreased stance time - left, Decreased weight shift to left Gait velocity: decreased     General Gait Details: step-to to slight step through, slight decreased LLE  stance and weightbearing, no knee buckling, decreased cadence and increased time with turns   Stairs Stairs: Yes Stairs assistance: Supervision Stair Management: With walker, Forwards, Step to pattern Number of Stairs: 1 (x2) General stair comments: pt ascends/descends 6 curb twice with RW and step-to gait pattern, spouse present holding gait belt for 2nd completion of curb, cues for sequencing, all questions answered   Wheelchair Mobility     Tilt Bed    Modified Rankin (Stroke Patients Only)       Balance Overall balance assessment: Needs assistance Sitting-balance support: Feet supported Sitting balance-Leahy Scale: Good     Standing balance support: During functional activity, Bilateral upper extremity supported, Reliant on assistive device for balance Standing balance-Leahy Scale: Poor                              Communication Communication Communication: Impaired Factors Affecting Communication: Hearing impaired (left hearing aides at home, reads lips)  Cognition Arousal: Alert Behavior During Therapy: WFL for tasks assessed/performed   PT - Cognitive impairments: No apparent impairments                         Following commands: Intact      Cueing Cueing Techniques: Verbal cues  Exercises Total Joint Exercises Ankle Circles/Pumps: AROM, Both, 5 reps, Supine Quad Sets: AROM, Left, Supine (3 reps) Short Arc Quad: AROM, Left, Supine (3 reps) Heel Slides: AROM, Left, Supine, Seated (3 reps) Hip ABduction/ADduction: AROM, Left, Supine (3 reps) Straight Leg Raises: AROM, Left, Supine (3 reps) Long Arc Quad: AROM, Left, Seated (3 reps)    General Comments  Pertinent Vitals/Pain Pain Assessment Pain Assessment: Faces Faces Pain Scale: Hurts little more Pain Location: L knee Pain Descriptors / Indicators: Grimacing, Discomfort Pain Intervention(s): Limited activity within patient's tolerance, Monitored during session,  Premedicated before session, Repositioned, Ice applied    Home Living Family/patient expects to be discharged to:: Private residence Living Arrangements: Spouse/significant other Available Help at Discharge: Family;Available 24 hours/day Type of Home: House Home Access: Stairs to enter Entrance Stairs-Rails: Right Entrance Stairs-Number of Steps: 1   Home Layout: One level Home Equipment: None;Shower seat Additional Comments: OPPT scheduled to start 04/23/24    Prior Function            PT Goals (current goals can now be found in the care plan section) Acute Rehab PT Goals Patient Stated Goal: regain independence PT Goal Formulation: With patient Time For Goal Achievement: 05/05/24 Potential to Achieve Goals: Good Progress towards PT goals: Progressing toward goals    Frequency    7X/week      PT Plan      Co-evaluation              AM-PAC PT 6 Clicks Mobility   Outcome Measure  Help needed turning from your back to your side while in a flat bed without using bedrails?: A Little Help needed moving from lying on your back to sitting on the side of a flat bed without using bedrails?: A Little Help needed moving to and from a bed to a chair (including a wheelchair)?: A Little Help needed standing up from a chair using your arms (e.g., wheelchair or bedside chair)?: A Little Help needed to walk in hospital room?: A Little Help needed climbing 3-5 steps with a railing? : A Little 6 Click Score: 18    End of Session Equipment Utilized During Treatment: Gait belt Activity Tolerance: Patient tolerated treatment well Patient left: in chair;with call bell/phone within reach;with chair alarm set;with family/visitor present Nurse Communication: Mobility status PT Visit Diagnosis: Muscle weakness (generalized) (M62.81);Unsteadiness on feet (R26.81);Pain Pain - Right/Left: Left Pain - part of body: Knee     Time: 1210-1223 PT Time Calculation (min) (ACUTE ONLY):  13 min  Charges:    $Gait Training: 8-22 mins PT General Charges $$ ACUTE PT VISIT: 1 Visit                     Tori Darrick Greenlaw PT, DPT 04/21/2024, 12:34 PM

## 2024-04-21 NOTE — Care Management Important Message (Signed)
 Important Message  Patient Details  Name: Brandon Schultz MRN: [id number] Date of Birth: 05/30/1950   Important Message Given:        Alfonse JONELLE Rex, RN 04/21/2024, 10:19 AM

## 2024-04-22 NOTE — Anesthesia Postprocedure Evaluation (Signed)
"   Anesthesia Post Note  Patient: Brandon Schultz  Procedure(s) Performed: ARTHROPLASTY, KNEE, TOTAL (Left: Knee)     Patient location during evaluation: PACU Anesthesia Type: General Level of consciousness: awake and alert Pain management: pain level controlled Vital Signs Assessment: post-procedure vital signs reviewed and stable Respiratory status: spontaneous breathing, nonlabored ventilation, respiratory function stable and patient connected to nasal cannula oxygen  Cardiovascular status: blood pressure returned to baseline and stable Postop Assessment: no apparent nausea or vomiting Anesthetic complications: no   No notable events documented.  Last Vitals:  Vitals:   04/21/24 0825 04/21/24 0952  BP: 105/68 137/78  Pulse: 76 73  Resp: 16 18  Temp: 36.7 C 36.6 C  SpO2: 99% 97%    Last Pain:  Vitals:   04/21/24 1324  TempSrc:   PainSc: 3                  Lynwood MARLA Cornea      "

## 2024-04-27 ENCOUNTER — Telehealth: Payer: Self-pay | Admitting: *Deleted

## 2024-04-27 ENCOUNTER — Ambulatory Visit: Payer: Self-pay

## 2024-04-27 ENCOUNTER — Other Ambulatory Visit: Payer: Self-pay

## 2024-04-27 ENCOUNTER — Ambulatory Visit (INDEPENDENT_AMBULATORY_CARE_PROVIDER_SITE_OTHER)

## 2024-04-27 ENCOUNTER — Telehealth: Payer: Self-pay

## 2024-04-27 DIAGNOSIS — R300 Dysuria: Secondary | ICD-10-CM

## 2024-04-27 LAB — POCT URINALYSIS DIP (MANUAL ENTRY)
Bilirubin, UA: NEGATIVE
Blood, UA: NEGATIVE
Glucose, UA: NEGATIVE mg/dL
Ketones, POC UA: NEGATIVE mg/dL
Leukocytes, UA: NEGATIVE
Nitrite, UA: NEGATIVE
Protein Ur, POC: NEGATIVE mg/dL
Spec Grav, UA: 1.01
Urobilinogen, UA: 1 U/dL
pH, UA: 7

## 2024-04-27 MED ORDER — PHENAZOPYRIDINE HCL 100 MG PO TABS: 100.0000 mg | ORAL_TABLET | Freq: Three times a day (TID) | ORAL | 0 refills | Status: AC

## 2024-04-27 NOTE — Telephone Encounter (Signed)
 This was documented in another note, provider said it was safe to take with other medications

## 2024-04-27 NOTE — Telephone Encounter (Signed)
 Wife came and picked this up and brought back the sample.

## 2024-04-27 NOTE — Telephone Encounter (Signed)
Pt wife informed of below.

## 2024-04-27 NOTE — Telephone Encounter (Signed)
 Please let patient know that Saddie said since his urine sample did not show a UTI, this means there is no infection and thus no clinical indication for an antibiotic.  I am going to send in a medication called Pyridium  that will help with the pain and discomfort. Please let them know that this medication will make his urine turn bright orange and that is normal.  Please encourage to continue with increased water  intake and keep us  updated by the end of the week.

## 2024-04-27 NOTE — Telephone Encounter (Signed)
 Copied from CRM #[id number]. Topic: Clinical - Red Word Triage >> Mar 24, 2024 11:51 AM [name] wrote: Red Word that prompted transfer to Nurse Triage: Brandon Schultz, the patient's wife, is calling because the patient is experiencing burning during urination. Patient urine is very foamy >> Apr 27, 2024 10:26 AM [name] wrote: Pt has video appt scheduled with Dr. Flynn for tomorrow for UTI pt can not come into clinic due to knee surgery. Pt's wife Brandon Schultz would like to pick up urine sample cup to bring back to office. Please call pt's wife Brandon Schultz, Brandon Schultz [phone].

## 2024-04-27 NOTE — Telephone Encounter (Signed)
Pt aware of the below information.

## 2024-04-27 NOTE — Telephone Encounter (Signed)
 Yes it is ok to take with his other meds

## 2024-04-27 NOTE — Progress Notes (Signed)
 Pt dropped off urine for possible UTI, unable to come in person due to having knee surgery recently.

## 2024-04-27 NOTE — Telephone Encounter (Signed)
 Called and spoke with pt and he stated that he already knew that he did not have a uti.  He said he had a test a few weeks ago, he just wants to get an antibiotic,  informed him that we normally do not send in antibiotics unless some infection shows. informed him that the provider is sending it off to make sure that there is not any infection.  He said something needs to be done, he is in pain with his knee and then this pain.  He was very frustrated. Routing to provider for any further guidance.

## 2024-04-27 NOTE — Telephone Encounter (Signed)
 That's fine. If they want to drop off the urine sample today, Im fine with that.

## 2024-04-27 NOTE — Telephone Encounter (Signed)
 Copied from CRM #[id number]. Topic: Clinical - Medical Advice >> Apr 27, 2024 12:03 PM Harlene ORN wrote: Reason for CRM: Please call the wife back before 2 pm to let her know when she can pick up the the cup for his urine sample.

## 2024-04-27 NOTE — Telephone Encounter (Signed)
 FYI Only or Action Required?: Action required by provider: clinical question for provider.  Patient was last seen in primary care on 04/27/2024 by Gayle Saddie FALCON, PA-C.  Called Nurse Triage reporting Medication Problem.  Symptoms began today.  Interventions attempted: Prescription medications: Flomax .  Symptoms are: unchanged.  Triage Disposition: Call PCP When Office is Open  Patient/caregiver understands and will follow disposition?: Yes  Reason for Disposition  [1] Caller has NON-URGENT medicine question about med that PCP prescribed AND [2] triager unable to answer question  Answer Assessment - Initial Assessment Questions 1. NAME of MEDICINE: What medicine(s) are you calling about?     Pyridium   2. QUESTION: What is your question? (e.g., double dose of medicine, side effect)     Can he take this medication with other current medications? Patient had recent knee replacement and is currently taking some medications related to this. Would also like to know what the cause of symptoms is? Asking if he needs to follow up with urologist?  3. PRESCRIBER: Who prescribed the medicine? Reason: if prescribed by specialist, call should be referred to that group.     Saddie Gayle  4. SYMPTOMS: Do you have any symptoms? If Yes, ask: What symptoms are you having?  How bad are the symptoms (e.g., mild, moderate, severe)     Burning throughout penis, dark urine, issues with ejaculation  5. PREGNANCY:  Is there any chance that you are pregnant? When was your last menstrual period?     NA  Protocols used: Medication Question Call-A-AH  Copied from CRM #[id number]. Topic: Clinical - Red Word Triage >> Apr 27, 2024  4:35 PM Wess RAMAN wrote: Red Word that prompted transfer to Nurse Triage: Burning during during urination, dark urine, issues with Ejaculation   Gayle Saddie, Pa-C note from 3:44p today: Please let patient know that Kara said since his urine sample did not show a UTI,  this means there is no infection and thus no clinical indication for an antibiotic.  I am going to send in a medication called Pyridium  that will help with the pain and discomfort. Please let them know that this medication will make his urine turn bright orange and that is normal.  Please encourage to continue with increased water  intake and keep us  updated by the end of the week.

## 2024-04-27 NOTE — Telephone Encounter (Signed)
 Please advise if he can come get a urine cup prior to video visit tomorrow.

## 2024-04-27 NOTE — Telephone Encounter (Signed)
 LVM for patient to return call to give the patient the information below.

## 2024-04-27 NOTE — Telephone Encounter (Signed)
 Copied from CRM #[id number]. Topic: Clinical - Medical Advice >> Apr 27, 2024  4:30 PM Wess RAMAN wrote: Reason for CRM: Patient's wife, Ellouise, would like to know if Pyridium  is safe to take with patient's other medication.  Callback #: [phone] or [phone]

## 2024-04-28 ENCOUNTER — Ambulatory Visit: Admitting: Family Medicine

## 2024-04-28 DIAGNOSIS — R300 Dysuria: Secondary | ICD-10-CM

## 2024-04-29 LAB — URINE CULTURE: Organism ID, Bacteria: NO GROWTH

## 2024-04-29 LAB — SPECIMEN STATUS REPORT

## 2024-05-04 NOTE — Telephone Encounter (Signed)
 Copied from CRM [id number]. Topic: Referral - Request for Referral >> May 04, 2024  9:44 AM Everette C wrote: Did the patient discuss referral with their provider in the last year? No (If No - schedule appointment) (If Yes - send message)  Appointment offered? No  Type of order/referral and detailed reason for visit: Urology, burning concern   Preference of office, provider, location: Patient has no preference   If referral order, have you been seen by this specialty before? No (If Yes, this issue or another issue? When? Where?  Can we respond through MyChart? No

## 2024-05-04 NOTE — Telephone Encounter (Signed)
 Referral to [facility] has been placed

## 2024-05-14 ENCOUNTER — Ambulatory Visit

## 2024-05-18 ENCOUNTER — Ambulatory Visit

## 2024-09-22 ENCOUNTER — Inpatient Hospital Stay

## 2025-01-14 ENCOUNTER — Ambulatory Visit

## 2025-02-16 ENCOUNTER — Ambulatory Visit
